# Patient Record
Sex: Female | Born: 1945
Health system: Southern US, Community
[De-identification: ages and names within clinical notes are randomized; demographics above are authoritative.]

## PROBLEM LIST (undated history)

## (undated) DIAGNOSIS — Z1159 Encounter for screening for other viral diseases: Secondary | ICD-10-CM

## (undated) DIAGNOSIS — R7303 Prediabetes: Secondary | ICD-10-CM

## (undated) DIAGNOSIS — E559 Vitamin D deficiency, unspecified: Secondary | ICD-10-CM

## (undated) DIAGNOSIS — K219 Gastro-esophageal reflux disease without esophagitis: Secondary | ICD-10-CM

## (undated) DIAGNOSIS — I10 Essential (primary) hypertension: Secondary | ICD-10-CM

## (undated) DIAGNOSIS — E785 Hyperlipidemia, unspecified: Secondary | ICD-10-CM

## (undated) DIAGNOSIS — C50919 Malignant neoplasm of unspecified site of unspecified female breast: Secondary | ICD-10-CM

## (undated) DIAGNOSIS — R0789 Other chest pain: Secondary | ICD-10-CM

## (undated) DIAGNOSIS — R002 Palpitations: Secondary | ICD-10-CM

## (undated) DIAGNOSIS — Z9189 Other specified personal risk factors, not elsewhere classified: Secondary | ICD-10-CM

## (undated) HISTORY — DX: Encounter for screening for other viral diseases: Z11.59

## (undated) HISTORY — DX: Gastro-esophageal reflux disease without esophagitis: K21.9

## (undated) HISTORY — DX: Prediabetes: R73.03

## (undated) HISTORY — DX: Vitamin D deficiency, unspecified: E55.9

## (undated) HISTORY — DX: Malignant neoplasm of unspecified site of unspecified female breast: C50.919

## (undated) HISTORY — PX: NASAL SINUS SURGERY: SHX719

## (undated) HISTORY — DX: Hyperlipidemia, unspecified: E78.5

## (undated) HISTORY — DX: Palpitations: R00.2

## (undated) HISTORY — PX: PARATHYROIDECTOMY: SHX19

## (undated) HISTORY — DX: Other specified personal risk factors, not elsewhere classified: Z91.89

## (undated) HISTORY — DX: Other chest pain: R07.89

## (undated) HISTORY — DX: Essential (primary) hypertension: I10

---

## 1997-10-12 ENCOUNTER — Other Ambulatory Visit: Admission: RE | Admit: 1997-10-12 | Discharge: 1997-10-12 | Payer: Self-pay | Admitting: Obstetrics and Gynecology

## 1999-06-19 ENCOUNTER — Ambulatory Visit (HOSPITAL_COMMUNITY): Admission: RE | Admit: 1999-06-19 | Discharge: 1999-06-19 | Payer: Self-pay

## 1999-07-19 ENCOUNTER — Ambulatory Visit (HOSPITAL_COMMUNITY): Admission: RE | Admit: 1999-07-19 | Discharge: 1999-07-20 | Payer: Self-pay

## 1999-09-25 ENCOUNTER — Other Ambulatory Visit: Admission: RE | Admit: 1999-09-25 | Discharge: 1999-09-25 | Payer: Self-pay | Admitting: Obstetrics and Gynecology

## 1999-09-27 ENCOUNTER — Encounter: Admission: RE | Admit: 1999-09-27 | Discharge: 1999-09-27 | Payer: Self-pay | Admitting: *Deleted

## 2000-06-30 ENCOUNTER — Ambulatory Visit (HOSPITAL_COMMUNITY): Admission: RE | Admit: 2000-06-30 | Discharge: 2000-06-30 | Payer: Self-pay | Admitting: Internal Medicine

## 2000-06-30 ENCOUNTER — Encounter: Payer: Self-pay | Admitting: Internal Medicine

## 2000-09-02 ENCOUNTER — Encounter (HOSPITAL_BASED_OUTPATIENT_CLINIC_OR_DEPARTMENT_OTHER): Payer: Self-pay | Admitting: General Surgery

## 2000-09-02 ENCOUNTER — Encounter: Admission: RE | Admit: 2000-09-02 | Discharge: 2000-09-02 | Payer: Self-pay | Admitting: General Surgery

## 2001-03-13 ENCOUNTER — Ambulatory Visit (HOSPITAL_COMMUNITY): Admission: RE | Admit: 2001-03-13 | Discharge: 2001-03-13 | Payer: Self-pay | Admitting: Internal Medicine

## 2001-03-13 ENCOUNTER — Encounter: Payer: Self-pay | Admitting: Internal Medicine

## 2002-05-07 ENCOUNTER — Encounter: Payer: Self-pay | Admitting: Internal Medicine

## 2002-05-07 ENCOUNTER — Encounter: Admission: RE | Admit: 2002-05-07 | Discharge: 2002-05-07 | Payer: Self-pay | Admitting: Internal Medicine

## 2002-05-19 ENCOUNTER — Other Ambulatory Visit: Admission: RE | Admit: 2002-05-19 | Discharge: 2002-05-19 | Payer: Self-pay | Admitting: Obstetrics and Gynecology

## 2003-06-21 ENCOUNTER — Encounter: Admission: RE | Admit: 2003-06-21 | Discharge: 2003-06-21 | Payer: Self-pay | Admitting: Internal Medicine

## 2003-06-28 ENCOUNTER — Other Ambulatory Visit: Admission: RE | Admit: 2003-06-28 | Discharge: 2003-06-28 | Payer: Self-pay | Admitting: Obstetrics and Gynecology

## 2004-03-27 ENCOUNTER — Ambulatory Visit (HOSPITAL_COMMUNITY): Admission: RE | Admit: 2004-03-27 | Discharge: 2004-03-27 | Payer: Self-pay | Admitting: Gastroenterology

## 2004-04-10 ENCOUNTER — Ambulatory Visit (HOSPITAL_BASED_OUTPATIENT_CLINIC_OR_DEPARTMENT_OTHER): Admission: RE | Admit: 2004-04-10 | Discharge: 2004-04-10 | Payer: Self-pay | Admitting: Otolaryngology

## 2004-06-29 ENCOUNTER — Encounter: Admission: RE | Admit: 2004-06-29 | Discharge: 2004-06-29 | Payer: Self-pay | Admitting: Internal Medicine

## 2004-07-06 ENCOUNTER — Other Ambulatory Visit: Admission: RE | Admit: 2004-07-06 | Discharge: 2004-07-06 | Payer: Self-pay | Admitting: Obstetrics and Gynecology

## 2005-07-16 ENCOUNTER — Encounter: Admission: RE | Admit: 2005-07-16 | Discharge: 2005-07-16 | Payer: Self-pay | Admitting: Internal Medicine

## 2005-07-17 ENCOUNTER — Other Ambulatory Visit: Admission: RE | Admit: 2005-07-17 | Discharge: 2005-07-17 | Payer: Self-pay | Admitting: Obstetrics and Gynecology

## 2005-07-18 ENCOUNTER — Encounter: Admission: RE | Admit: 2005-07-18 | Discharge: 2005-07-18 | Payer: Self-pay | Admitting: Internal Medicine

## 2006-01-13 ENCOUNTER — Encounter: Admission: RE | Admit: 2006-01-13 | Discharge: 2006-01-13 | Payer: Self-pay | Admitting: Internal Medicine

## 2006-07-18 ENCOUNTER — Encounter: Admission: RE | Admit: 2006-07-18 | Discharge: 2006-07-18 | Payer: Self-pay | Admitting: Internal Medicine

## 2007-07-27 ENCOUNTER — Encounter: Admission: RE | Admit: 2007-07-27 | Discharge: 2007-07-27 | Payer: Self-pay | Admitting: Internal Medicine

## 2008-10-03 ENCOUNTER — Encounter: Admission: RE | Admit: 2008-10-03 | Discharge: 2008-10-03 | Payer: Self-pay | Admitting: Internal Medicine

## 2009-08-28 ENCOUNTER — Encounter: Admission: RE | Admit: 2009-08-28 | Discharge: 2009-08-28 | Payer: Self-pay | Admitting: Internal Medicine

## 2009-08-30 ENCOUNTER — Ambulatory Visit: Payer: Self-pay | Admitting: Oncology

## 2009-08-31 ENCOUNTER — Encounter: Admission: RE | Admit: 2009-08-31 | Discharge: 2009-08-31 | Payer: Self-pay | Admitting: Internal Medicine

## 2009-09-06 LAB — COMPREHENSIVE METABOLIC PANEL
ALT: 14 U/L (ref 0–35)
AST: 24 U/L (ref 0–37)
Albumin: 4.4 g/dL (ref 3.5–5.2)
Alkaline Phosphatase: 96 U/L (ref 39–117)
Calcium: 10 mg/dL (ref 8.4–10.5)
Chloride: 106 mEq/L (ref 96–112)
Potassium: 3.6 mEq/L (ref 3.5–5.3)
Sodium: 141 mEq/L (ref 135–145)
Total Protein: 7.6 g/dL (ref 6.0–8.3)

## 2009-09-06 LAB — CBC WITH DIFFERENTIAL/PLATELET
BASO%: 0.6 % (ref 0.0–2.0)
Basophils Absolute: 0 10*3/uL (ref 0.0–0.1)
EOS%: 0.3 % (ref 0.0–7.0)
HGB: 15 g/dL (ref 11.6–15.9)
MCH: 31 pg (ref 25.1–34.0)
MCHC: 34 g/dL (ref 31.5–36.0)
MCV: 91.1 fL (ref 79.5–101.0)
MONO%: 9.2 % (ref 0.0–14.0)
RBC: 4.85 10*6/uL (ref 3.70–5.45)
RDW: 13.2 % (ref 11.2–14.5)
lymph#: 1.4 10*3/uL (ref 0.9–3.3)

## 2009-09-14 ENCOUNTER — Ambulatory Visit (HOSPITAL_COMMUNITY): Admission: RE | Admit: 2009-09-14 | Discharge: 2009-09-14 | Payer: Self-pay | Admitting: Oncology

## 2009-09-15 ENCOUNTER — Ambulatory Visit (HOSPITAL_BASED_OUTPATIENT_CLINIC_OR_DEPARTMENT_OTHER): Admission: RE | Admit: 2009-09-15 | Discharge: 2009-09-15 | Payer: Self-pay | Admitting: General Surgery

## 2009-09-28 LAB — CBC WITH DIFFERENTIAL/PLATELET
BASO%: 0 % (ref 0.0–2.0)
Basophils Absolute: 0 10*3/uL (ref 0.0–0.1)
EOS%: 0 % (ref 0.0–7.0)
HCT: 42.7 % (ref 34.8–46.6)
LYMPH%: 44.4 % (ref 14.0–49.7)
MCH: 30.2 pg (ref 25.1–34.0)
MCHC: 34 g/dL (ref 31.5–36.0)
NEUT%: 33.4 % — ABNORMAL LOW (ref 38.4–76.8)
Platelets: 120 10*3/uL — ABNORMAL LOW (ref 145–400)

## 2009-10-04 ENCOUNTER — Ambulatory Visit: Payer: Self-pay | Admitting: Oncology

## 2009-10-05 LAB — CBC WITH DIFFERENTIAL/PLATELET
BASO%: 0.6 % (ref 0.0–2.0)
EOS%: 0.1 % (ref 0.0–7.0)
HCT: 38.7 % (ref 34.8–46.6)
MCH: 31.4 pg (ref 25.1–34.0)
MCHC: 34.2 g/dL (ref 31.5–36.0)
MCV: 91.8 fL (ref 79.5–101.0)
MONO%: 5.9 % (ref 0.0–14.0)
NEUT%: 82.1 % — ABNORMAL HIGH (ref 38.4–76.8)
lymph#: 0.8 10*3/uL — ABNORMAL LOW (ref 0.9–3.3)

## 2009-10-12 LAB — COMPREHENSIVE METABOLIC PANEL
ALT: 11 U/L (ref 0–35)
AST: 15 U/L (ref 0–37)
CO2: 23 mEq/L (ref 19–32)
Creatinine, Ser: 0.95 mg/dL (ref 0.40–1.20)
Total Bilirubin: 0.4 mg/dL (ref 0.3–1.2)

## 2009-10-12 LAB — CBC WITH DIFFERENTIAL/PLATELET
BASO%: 0.2 % (ref 0.0–2.0)
EOS%: 0 % (ref 0.0–7.0)
LYMPH%: 6.7 % — ABNORMAL LOW (ref 14.0–49.7)
MCHC: 33.3 g/dL (ref 31.5–36.0)
MCV: 90.6 fL (ref 79.5–101.0)
MONO%: 1.5 % (ref 0.0–14.0)
Platelets: 287 10*3/uL (ref 145–400)
RBC: 4.45 10*6/uL (ref 3.70–5.45)
nRBC: 0 % (ref 0–0)

## 2009-10-19 LAB — CBC WITH DIFFERENTIAL/PLATELET
BASO%: 1.1 % (ref 0.0–2.0)
HCT: 38 % (ref 34.8–46.6)
LYMPH%: 40 % (ref 14.0–49.7)
MCHC: 34 g/dL (ref 31.5–36.0)
MONO#: 0.2 10*3/uL (ref 0.1–0.9)
NEUT%: 37.3 % — ABNORMAL LOW (ref 38.4–76.8)
Platelets: 134 10*3/uL — ABNORMAL LOW (ref 145–400)
WBC: 1 10*3/uL — ABNORMAL LOW (ref 3.9–10.3)

## 2009-11-02 LAB — CBC WITH DIFFERENTIAL/PLATELET
BASO%: 0.1 % (ref 0.0–2.0)
EOS%: 0 % (ref 0.0–7.0)
HCT: 38.2 % (ref 34.8–46.6)
LYMPH%: 6.5 % — ABNORMAL LOW (ref 14.0–49.7)
MCH: 30 pg (ref 25.1–34.0)
MCHC: 32.7 g/dL (ref 31.5–36.0)
MONO%: 2.4 % (ref 0.0–14.0)
NEUT%: 91 % — ABNORMAL HIGH (ref 38.4–76.8)
Platelets: 246 10*3/uL (ref 145–400)
lymph#: 0.6 10*3/uL — ABNORMAL LOW (ref 0.9–3.3)

## 2009-11-02 LAB — COMPREHENSIVE METABOLIC PANEL
ALT: 9 U/L (ref 0–35)
AST: 17 U/L (ref 0–37)
Alkaline Phosphatase: 58 U/L (ref 39–117)
Creatinine, Ser: 0.91 mg/dL (ref 0.40–1.20)
Total Bilirubin: 0.5 mg/dL (ref 0.3–1.2)

## 2009-11-03 ENCOUNTER — Ambulatory Visit: Payer: Self-pay | Admitting: Oncology

## 2009-11-09 LAB — CBC WITH DIFFERENTIAL/PLATELET
BASO%: 0 % (ref 0.0–2.0)
Eosinophils Absolute: 0 10*3/uL (ref 0.0–0.5)
MCHC: 33.5 g/dL (ref 31.5–36.0)
MONO#: 0 10*3/uL — ABNORMAL LOW (ref 0.1–0.9)
NEUT#: 0 10*3/uL — CL (ref 1.5–6.5)
RBC: 4.44 10*6/uL (ref 3.70–5.45)
WBC: 0.5 10*3/uL — CL (ref 3.9–10.3)
lymph#: 0.5 10*3/uL — ABNORMAL LOW (ref 0.9–3.3)
nRBC: 0 % (ref 0–0)

## 2009-11-20 ENCOUNTER — Encounter: Admission: RE | Admit: 2009-11-20 | Discharge: 2009-11-20 | Payer: Self-pay | Admitting: Oncology

## 2009-11-23 LAB — CBC WITH DIFFERENTIAL/PLATELET
Basophils Absolute: 0 10*3/uL (ref 0.0–0.1)
EOS%: 0 % (ref 0.0–7.0)
Eosinophils Absolute: 0 10*3/uL (ref 0.0–0.5)
HGB: 11.5 g/dL — ABNORMAL LOW (ref 11.6–15.9)
MONO#: 0.6 10*3/uL (ref 0.1–0.9)
NEUT#: 12 10*3/uL — ABNORMAL HIGH (ref 1.5–6.5)
RDW: 15.3 % — ABNORMAL HIGH (ref 11.2–14.5)
WBC: 13.2 10*3/uL — ABNORMAL HIGH (ref 3.9–10.3)
lymph#: 0.6 10*3/uL — ABNORMAL LOW (ref 0.9–3.3)

## 2009-11-23 LAB — COMPREHENSIVE METABOLIC PANEL
ALT: 13 U/L (ref 0–35)
AST: 18 U/L (ref 0–37)
Albumin: 4.1 g/dL (ref 3.5–5.2)
Alkaline Phosphatase: 43 U/L (ref 39–117)
BUN: 16 mg/dL (ref 6–23)
Potassium: 3.8 mEq/L (ref 3.5–5.3)

## 2009-11-30 LAB — CBC WITH DIFFERENTIAL/PLATELET
Basophils Absolute: 0 10*3/uL (ref 0.0–0.1)
EOS%: 1.7 % (ref 0.0–7.0)
HCT: 35.1 % (ref 34.8–46.6)
HGB: 11.5 g/dL — ABNORMAL LOW (ref 11.6–15.9)
MCH: 30 pg (ref 25.1–34.0)
MCHC: 32.8 g/dL (ref 31.5–36.0)
MCV: 91.6 fL (ref 79.5–101.0)
MONO%: 22.4 % — ABNORMAL HIGH (ref 0.0–14.0)
NEUT%: 19.1 % — ABNORMAL LOW (ref 38.4–76.8)
RDW: 14.9 % — ABNORMAL HIGH (ref 11.2–14.5)

## 2009-12-12 ENCOUNTER — Ambulatory Visit: Payer: Self-pay | Admitting: Oncology

## 2009-12-14 LAB — COMPREHENSIVE METABOLIC PANEL
ALT: 11 U/L (ref 0–35)
AST: 18 U/L (ref 0–37)
Albumin: 4 g/dL (ref 3.5–5.2)
CO2: 19 mEq/L (ref 19–32)
Calcium: 9.3 mg/dL (ref 8.4–10.5)
Chloride: 105 mEq/L (ref 96–112)
Creatinine, Ser: 0.76 mg/dL (ref 0.40–1.20)
Potassium: 4 mEq/L (ref 3.5–5.3)
Sodium: 140 mEq/L (ref 135–145)
Total Protein: 5.5 g/dL — ABNORMAL LOW (ref 6.0–8.3)

## 2009-12-14 LAB — CBC WITH DIFFERENTIAL/PLATELET
BASO%: 0.1 % (ref 0.0–2.0)
EOS%: 0 % (ref 0.0–7.0)
HCT: 33.6 % — ABNORMAL LOW (ref 34.8–46.6)
MCH: 30.8 pg (ref 25.1–34.0)
MCHC: 32.1 g/dL (ref 31.5–36.0)
MONO#: 0.4 10*3/uL (ref 0.1–0.9)
NEUT%: 93.4 % — ABNORMAL HIGH (ref 38.4–76.8)
RDW: 17.2 % — ABNORMAL HIGH (ref 11.2–14.5)
WBC: 14.4 10*3/uL — ABNORMAL HIGH (ref 3.9–10.3)
lymph#: 0.6 10*3/uL — ABNORMAL LOW (ref 0.9–3.3)

## 2009-12-21 LAB — CBC WITH DIFFERENTIAL/PLATELET
BASO%: 2.1 % — ABNORMAL HIGH (ref 0.0–2.0)
EOS%: 0.4 % (ref 0.0–7.0)
HCT: 28.9 % — ABNORMAL LOW (ref 34.8–46.6)
MCH: 30.8 pg (ref 25.1–34.0)
MCHC: 32.5 g/dL (ref 31.5–36.0)
MONO#: 0.5 10*3/uL (ref 0.1–0.9)
NEUT%: 58.5 % (ref 38.4–76.8)
RBC: 3.05 10*6/uL — ABNORMAL LOW (ref 3.70–5.45)
RDW: 15.8 % — ABNORMAL HIGH (ref 11.2–14.5)
WBC: 2.4 10*3/uL — ABNORMAL LOW (ref 3.9–10.3)
lymph#: 0.5 10*3/uL — ABNORMAL LOW (ref 0.9–3.3)
nRBC: 0 % (ref 0–0)

## 2009-12-28 LAB — CBC WITH DIFFERENTIAL/PLATELET
EOS%: 0.1 % (ref 0.0–7.0)
Eosinophils Absolute: 0 10*3/uL (ref 0.0–0.5)
HGB: 10.2 g/dL — ABNORMAL LOW (ref 11.6–15.9)
MCV: 97.2 fL (ref 79.5–101.0)
MONO%: 5.3 % (ref 0.0–14.0)
NEUT#: 17.9 10*3/uL — ABNORMAL HIGH (ref 1.5–6.5)
RBC: 3.27 10*6/uL — ABNORMAL LOW (ref 3.70–5.45)
RDW: 16.6 % — ABNORMAL HIGH (ref 11.2–14.5)
lymph#: 0.9 10*3/uL (ref 0.9–3.3)
nRBC: 1 % — ABNORMAL HIGH (ref 0–0)

## 2010-01-08 HISTORY — PX: MASTECTOMY: SHX3

## 2010-01-09 ENCOUNTER — Ambulatory Visit: Admission: RE | Admit: 2010-01-09 | Discharge: 2010-04-09 | Payer: Self-pay | Admitting: Radiation Oncology

## 2010-01-12 ENCOUNTER — Ambulatory Visit: Payer: Self-pay | Admitting: Oncology

## 2010-01-16 LAB — CBC WITH DIFFERENTIAL/PLATELET
BASO%: 0.3 % (ref 0.0–2.0)
Eosinophils Absolute: 0 10*3/uL (ref 0.0–0.5)
MCHC: 31.8 g/dL (ref 31.5–36.0)
MCV: 103.7 fL — ABNORMAL HIGH (ref 79.5–101.0)
MONO#: 0.8 10*3/uL (ref 0.1–0.9)
MONO%: 21.2 % — ABNORMAL HIGH (ref 0.0–14.0)
NEUT#: 1.9 10*3/uL (ref 1.5–6.5)
RBC: 3.24 10*6/uL — ABNORMAL LOW (ref 3.70–5.45)
RDW: 17.4 % — ABNORMAL HIGH (ref 11.2–14.5)
WBC: 4 10*3/uL (ref 3.9–10.3)
nRBC: 0 % (ref 0–0)

## 2010-01-31 ENCOUNTER — Ambulatory Visit (HOSPITAL_COMMUNITY): Admission: RE | Admit: 2010-01-31 | Discharge: 2010-02-01 | Payer: Self-pay | Admitting: General Surgery

## 2010-01-31 ENCOUNTER — Encounter (INDEPENDENT_AMBULATORY_CARE_PROVIDER_SITE_OTHER): Payer: Self-pay | Admitting: General Surgery

## 2010-03-05 ENCOUNTER — Ambulatory Visit: Payer: Self-pay | Admitting: Oncology

## 2010-03-05 LAB — COMPREHENSIVE METABOLIC PANEL
Albumin: 4.3 g/dL (ref 3.5–5.2)
BUN: 11 mg/dL (ref 6–23)
CO2: 30 mEq/L (ref 19–32)
Calcium: 10 mg/dL (ref 8.4–10.5)
Chloride: 103 mEq/L (ref 96–112)
Glucose, Bld: 103 mg/dL — ABNORMAL HIGH (ref 70–99)
Potassium: 3.7 mEq/L (ref 3.5–5.3)
Sodium: 142 mEq/L (ref 135–145)
Total Protein: 6.8 g/dL (ref 6.0–8.3)

## 2010-03-05 LAB — CBC WITH DIFFERENTIAL/PLATELET
Basophils Absolute: 0 10*3/uL (ref 0.0–0.1)
Eosinophils Absolute: 0 10*3/uL (ref 0.0–0.5)
HCT: 37.8 % (ref 34.8–46.6)
MCV: 95.2 fL (ref 79.5–101.0)
MONO#: 0.3 10*3/uL (ref 0.1–0.9)
RDW: 13.6 % (ref 11.2–14.5)
lymph#: 0.7 10*3/uL — ABNORMAL LOW (ref 0.9–3.3)

## 2010-03-21 ENCOUNTER — Ambulatory Visit: Payer: Self-pay | Admitting: Genetic Counselor

## 2010-04-09 ENCOUNTER — Ambulatory Visit: Admission: RE | Admit: 2010-04-09 | Discharge: 2010-04-16 | Payer: Self-pay | Admitting: Radiation Oncology

## 2010-06-19 ENCOUNTER — Ambulatory Visit: Payer: Self-pay | Admitting: Oncology

## 2010-06-21 LAB — CBC WITH DIFFERENTIAL/PLATELET
BASO%: 0.4 % (ref 0.0–2.0)
Basophils Absolute: 0 10*3/uL (ref 0.0–0.1)
EOS%: 0.6 % (ref 0.0–7.0)
Eosinophils Absolute: 0 10*3/uL (ref 0.0–0.5)
HCT: 40.2 % (ref 34.8–46.6)
HGB: 13.6 g/dL (ref 11.6–15.9)
LYMPH%: 28.2 % (ref 14.0–49.7)
MCH: 31.4 pg (ref 25.1–34.0)
MCHC: 33.8 g/dL (ref 31.5–36.0)
MCV: 92.8 fL (ref 79.5–101.0)
MONO#: 0.3 10*3/uL (ref 0.1–0.9)
MONO%: 9 % (ref 0.0–14.0)
NEUT#: 1.8 10*3/uL (ref 1.5–6.5)
NEUT%: 61.8 % (ref 38.4–76.8)
Platelets: 183 10*3/uL (ref 145–400)
RBC: 4.33 10*6/uL (ref 3.70–5.45)
RDW: 13.8 % (ref 11.2–14.5)
WBC: 2.9 10*3/uL — ABNORMAL LOW (ref 3.9–10.3)
lymph#: 0.8 10*3/uL — ABNORMAL LOW (ref 0.9–3.3)

## 2010-07-01 ENCOUNTER — Encounter: Payer: Self-pay | Admitting: Oncology

## 2010-08-22 ENCOUNTER — Other Ambulatory Visit: Payer: Self-pay | Admitting: Obstetrics and Gynecology

## 2010-08-24 LAB — URINALYSIS, ROUTINE W REFLEX MICROSCOPIC
Bilirubin Urine: NEGATIVE
Ketones, ur: NEGATIVE mg/dL
Nitrite: NEGATIVE
Protein, ur: NEGATIVE mg/dL
Urobilinogen, UA: 0.2 mg/dL (ref 0.0–1.0)
pH: 5.5 (ref 5.0–8.0)

## 2010-08-24 LAB — COMPREHENSIVE METABOLIC PANEL
AST: 31 U/L (ref 0–37)
CO2: 30 mEq/L (ref 19–32)
Chloride: 100 mEq/L (ref 96–112)
Creatinine, Ser: 0.8 mg/dL (ref 0.4–1.2)
GFR calc Af Amer: >60 mL/min (ref >60)
GFR calc non Af Amer: >60 mL/min (ref >60)
Glucose, Bld: 106 mg/dL — ABNORMAL HIGH (ref 70–99)
Total Bilirubin: 0.6 mg/dL (ref 0.3–1.2)

## 2010-08-24 LAB — CBC
HCT: 37.7 % (ref 36.0–46.0)
Hemoglobin: 12.2 g/dL (ref 12.0–15.0)
MCH: 33.2 pg (ref 26.0–34.0)
MCV: 102.7 fL — ABNORMAL HIGH (ref 78.0–100.0)
RBC: 3.67 MIL/uL — ABNORMAL LOW (ref 3.87–5.11)

## 2010-08-24 LAB — DIFFERENTIAL
Basophils Absolute: 0 10*3/uL (ref 0.0–0.1)
Eosinophils Absolute: 0 10*3/uL (ref 0.0–0.7)
Eosinophils Relative: 1 % (ref 0–5)
Lymphocytes Relative: 32 % (ref 12–46)

## 2010-08-24 LAB — URINE MICROSCOPIC-ADD ON

## 2010-08-24 LAB — SURGICAL PCR SCREEN: Staphylococcus aureus: NEGATIVE

## 2010-08-29 LAB — COMPREHENSIVE METABOLIC PANEL
ALT: 13 U/L (ref 0–35)
AST: 26 U/L (ref 0–37)
CO2: 30 mEq/L (ref 19–32)
Chloride: 106 mEq/L (ref 96–112)
Creatinine, Ser: 1.12 mg/dL (ref 0.4–1.2)
GFR calc Af Amer: 60 mL/min — ABNORMAL LOW (ref >60)
GFR calc non Af Amer: 49 mL/min — ABNORMAL LOW (ref >60)
Glucose, Bld: 117 mg/dL — ABNORMAL HIGH (ref 70–99)
Total Bilirubin: 0.9 mg/dL (ref 0.3–1.2)

## 2010-08-29 LAB — DIFFERENTIAL
Basophils Absolute: 0 10*3/uL (ref 0.0–0.1)
Eosinophils Absolute: 0 10*3/uL (ref 0.0–0.7)
Eosinophils Relative: 1 % (ref 0–5)
Lymphocytes Relative: 30 % (ref 12–46)
Neutrophils Relative %: 62 % (ref 43–77)

## 2010-08-29 LAB — URINALYSIS, ROUTINE W REFLEX MICROSCOPIC
Ketones, ur: NEGATIVE mg/dL
Nitrite: NEGATIVE
Protein, ur: NEGATIVE mg/dL
Urobilinogen, UA: 0.2 mg/dL (ref 0.0–1.0)

## 2010-08-29 LAB — CBC
Hemoglobin: 14.3 g/dL (ref 12.0–15.0)
MCHC: 33.7 g/dL (ref 30.0–36.0)
MCV: 91.3 fL (ref 78.0–100.0)
RBC: 4.65 MIL/uL (ref 3.87–5.11)
WBC: 4.9 10*3/uL (ref 4.0–10.5)

## 2010-10-17 ENCOUNTER — Encounter (INDEPENDENT_AMBULATORY_CARE_PROVIDER_SITE_OTHER): Payer: Self-pay | Admitting: General Surgery

## 2010-10-18 ENCOUNTER — Ambulatory Visit (INDEPENDENT_AMBULATORY_CARE_PROVIDER_SITE_OTHER): Payer: 59 | Admitting: Internal Medicine

## 2010-10-18 ENCOUNTER — Encounter: Payer: Self-pay | Admitting: Internal Medicine

## 2010-10-18 DIAGNOSIS — R079 Chest pain, unspecified: Secondary | ICD-10-CM | POA: Insufficient documentation

## 2010-10-18 DIAGNOSIS — E785 Hyperlipidemia, unspecified: Secondary | ICD-10-CM

## 2010-10-18 DIAGNOSIS — I10 Essential (primary) hypertension: Secondary | ICD-10-CM | POA: Insufficient documentation

## 2010-10-18 DIAGNOSIS — E782 Mixed hyperlipidemia: Secondary | ICD-10-CM | POA: Insufficient documentation

## 2010-10-18 NOTE — Assessment & Plan Note (Signed)
Has been on crestor for a long time.  Will need to get labs.

## 2010-10-18 NOTE — Assessment & Plan Note (Signed)
BP is borderline high on meds.  I would follow for now.

## 2010-10-18 NOTE — Assessment & Plan Note (Signed)
CHest pressure is not completely typical for angina.  BUt concerning . She does have risk factors for CAD  I would recomm continuing meds.  I also would recomm a stress myoview to rule out inducible ischemia.

## 2010-10-18 NOTE — Patient Instructions (Signed)
Your physician has requested that you have en exercise stress myoview. For further information please visit [email or web address] Please follow instruction sheet, as given. We will call you with results.

## 2010-10-18 NOTE — Progress Notes (Signed)
HPI Patient is a 65 year old with no history of CAD.  She was referred for evaluation of chest tightness. THe patient notes episodes of L sided chest tightness over the past few months.  She says that they mainly occur on the weekends when she is sitting at her mothers.  Occasionall she gets them at home during the week.  Last minutes.  Pain is not pleuriitc or positional. In March of last year she was diagnosied with breast cancer  She underwent chemo, XRT an bilateral mastectomy  She wonders if some of her symptoms are related to surgery. Allergies  Allergen Reactions  . Penicillins   . Sulfa Antibiotics     Current Outpatient Prescriptions  Medication Sig Dispense Refill  . aspirin 81 MG tablet Take 81 mg by mouth daily.        . bisoprolol-hydrochlorothiazide (ZIAC) 5-6.25 MG per tablet Take 1 tablet by mouth daily.        . Cholecalciferol (VITAMIN D-3) 5000 UNITS TABS Take 1 tablet by mouth daily.        . fluticasone (FLONASE) 50 MCG/ACT nasal spray 2 sprays by Nasal route daily.        Marland Kitchen letrozole (FEMARA) 2.5 MG tablet Take 2.5 mg by mouth daily.        . quinapril (ACCUPRIL) 20 MG tablet Take 10 mg by mouth at bedtime.        . rosuvastatin (CRESTOR) 20 MG tablet Take 10 mg by mouth daily.        Marland Kitchen DISCONTD: hydrochlorothiazide (HYDRODIURIL) 12.5 MG tablet Take 12.5 mg by mouth daily.        Marland Kitchen DISCONTD: mupirocin (BACTROBAN) 2 % ointment Apply topically as needed.        Marland Kitchen DISCONTD: rosuvastatin (CRESTOR) 10 MG tablet Take 10 mg by mouth daily.         Past Medical History  Diagnosis Date  . Hypertension   . Chest tightness   . Palpitations     Past Surgical History  Procedure Date  . Mastectomy August 2011    Bilateral    Family History  Problem Relation Age of Onset  . Diabetes Mother   . Cancer Mother     breast    History   Social History  . Marital Status: Married    Spouse Name: N/A    Number of Children: N/A  . Years of Education: N/A    Occupational History  . Not on file.   Social History Main Topics  . Smoking status: Never Smoker   . Smokeless tobacco: Not on file  . Alcohol Use: No  . Drug Use: No  . Sexually Active: Not on file   Other Topics Concern  . Not on file   Social History Narrative  . No narrative on file    Review of Systems:  All systems reviewed.  They are negative to the above problem except as previously stated.  Vital Signs: BP 140/90  Pulse 69  Resp 18  Ht 5\' 7"  (1.702 m)  Wt 141 lb 1.9 oz (64.012 kg)  BMI 22.10 kg/m2  Physical Exam  HEENT:  Normocephalic, atraumatic. EOMI, PERRLA.  Neck: JVP is normal. No thyromegaly. No bruits.  Lungs: clear to auscultation. No rales no wheezes.  Chest:  S/p bilateral mastectomy Heart: Regular rate and rhythm. Normal S1, S2. No S3.   No significant murmurs. PMI not displaced.  Abdomen:  Supple, nontender. Normal bowel sounds. No masses. No hepatomegaly.  Extremities:   Good distal pulses throughout. No lower extremity edema.  Musculoskeletal :moving all extremities.  Neuro:   alert and oriented x3.  CN II-XII grossly intact.   Assessment and Plan:

## 2010-10-26 ENCOUNTER — Encounter (INDEPENDENT_AMBULATORY_CARE_PROVIDER_SITE_OTHER): Payer: Self-pay | Admitting: General Surgery

## 2010-10-26 NOTE — Op Note (Signed)
Shannon Nielsen, Shannon Nielsen               ACCOUNT NO.:  [id number]   MEDICAL RECORD NO.:  [id number]          PATIENT TYPE:  AMB   LOCATION:  ENDO                         FACILITY[facility]  [facility]   PHYSICIAN:  Petra Kuba, M.D.    DATE OF BIRTH:  [birth year]   DATE OF PROCEDURE:  03/27/2004  DATE OF DISCHARGE:                                 OPERATIVE REPORT   PROCEDURE:  Colonoscopy.   INDICATION:  Bright red blood per rectum, probably due to hemorrhoids, due  for colonic screening.  Consent was signed after risks, benefits, methods,  options thoroughly discussed in the office in the past.   MEDICINES USED:  1.  Demerol 60.  2.  Versed 6.   DESCRIPTION OF PROCEDURE:  Rectal inspection was pertinent for obvious  external hemorrhoids.  Digital exam was negative.  Video pediatric  adjustable colonoscope was inserted and with abdominal pressure, able to be  advanced around the colon to the cecum.  This also required rolling her on  her back.  No abnormality was seen on insertion.  The cecum was identified  by the appendiceal orifice and the ileocecal valve.  In fact, the scope was  inserted just into the opening of the TI which was normal on quick  evaluation.  The scope was slowly withdrawn.  The prep on the left was  greater than the right.  The right did have some stool adherent to the wall  which could not be washed off.  The rest of the prep was adequate but on  slow withdrawal through the colon, no abnormalities were seen.  Specifically, no polyps, tumors, masses, or diverticula.  Once back in the  rectum, anorectal pull-through and retroflexion confirmed the external  greater than internal hemorrhoids.  The scope was straightened, readvanced a  short ways up the left side of the colon; air was suctioned and the scope  removed.  The patient tolerated the procedure well.  There was no obvious  immediate complication.   ENDOSCOPIC DIAGNOSES:  1.  External greater than internal  hemorrhoids.  2.  Otherwise, within normal limits to the cecum.   PLAN:  1.  Recheck colon screening in 5-10 years.  2.  Happy to see back p.r.n..  3.  Preparation H plus 1% hydrocortisone to her hemorrhoids.  Consider      prescription creams and suppositories or to surgery p.r.n.      MEM/MEDQ  D:  03/27/2004  T:  03/27/2004  Job:  [id number]   cc:   Lucky Cowboy, M.D.  [address]  [place], [place] [address]  Fax: [phone]   Miguel Aschoff, M.D.  [address]  [place]  [place] [address]  Fax: [phone]

## 2010-10-26 NOTE — Procedures (Signed)
NAME:  Shannon Nielsen, Shannon Nielsen               ACCOUNT NO.:  [id number]   MEDICAL RECORD NO.:  [id number]          PATIENT TYPE:  OUT   LOCATION:  SLEEP CENTER                 FACILITY:  [facility]   PHYSICIAN:  Clinton D. Maple Hudson, M.D. DATE OF BIRTH:  1945-10-12   DATE OF STUDY:  04/10/2004                              NOCTURNAL POLYSOMNOGRAM   REFERRING PHYSICIAN:  Suzanna Obey, M.D.   INDICATION FOR STUDY AND HISTORY:  Hypersomnia with sleep apnea.   Epworth sleepiness score 20/24, neck size 14.5 inches.   SLEEP ARCHITECTURE:  Total sleep time 334 minutes with sleep efficiency of  92%.  Stage I was 5%; stage II, 41%; Stages III and IV 19%.  REM was 36% of  total sleep time.  Latency to sleep onset 11 minutes, latency to REM 45  minutes.  Awake after sleep onset 19.5 minute.  Arousal index 23.   RESPIRATORY DATA:  NPFG protocol.  RDI 23.9 per hour indicating moderate  obstructive sleep apnea/hypopnea syndrome.  This reflect 12 obstructive  apneas and 121 hypopneas.  Events were not positional.  REM RDI 46 per hour.   OXYGEN DATA:  Loud snoring with oxygen desaturation to a nadir of 87% with  apneas.  Mean oxygen saturation through the study was 97 to 98% on room air.   CARDIAC DATA:  Normal sinus rhythm.   MOVEMENT AND PARASOMNIA:  A total of 51 limb jerks were recorded of which 13  were associated with arousal or awakening for a periodic limb movement with  arousal index of 2.3 per hour.   IMPRESSION AND RECOMMENDATIONS:  1.  Moderate obstructive sleep apnea/hypopnea syndrome, RDI 13.9 per hour      with desaturation to 87%.  2.  Mild periodic limb movement with arousal, 2.3 per hour.   Clinton D. Maple Hudson, M.D.  Diplomate, Biomedical engineer of Sleep Medicine                                                           Clinton D. Maple Hudson, M.D.  Diplomate, American Board   CDY/MEDQ  D:  04/22/2004 09:37:49  T:  04/22/2004 11:32:52  Job:  [id number]   cc:   Suzanna Obey, M.D.  [address] [place]  [place] [address]  Fax: [phone]

## 2010-10-29 ENCOUNTER — Ambulatory Visit (HOSPITAL_COMMUNITY): Payer: 59 | Attending: Internal Medicine | Admitting: Radiology

## 2010-10-29 ENCOUNTER — Encounter (HOSPITAL_COMMUNITY): Payer: Self-pay | Admitting: Radiology

## 2010-10-29 DIAGNOSIS — R0789 Other chest pain: Secondary | ICD-10-CM

## 2010-10-29 DIAGNOSIS — R9431 Abnormal electrocardiogram [ECG] [EKG]: Secondary | ICD-10-CM

## 2010-10-29 DIAGNOSIS — R079 Chest pain, unspecified: Secondary | ICD-10-CM | POA: Insufficient documentation

## 2010-10-29 MED ORDER — TECHNETIUM TC 99M TETROFOSMIN IV KIT
33.0000 | PACK | Freq: Once | INTRAVENOUS | Status: AC | PRN
  Administered 2010-10-29: 33 via INTRAVENOUS

## 2010-10-29 MED ORDER — TECHNETIUM TC 99M TETROFOSMIN IV KIT
10.9000 | PACK | Freq: Once | INTRAVENOUS | Status: AC | PRN
  Administered 2010-10-29: 10.9 via INTRAVENOUS

## 2010-10-29 NOTE — Progress Notes (Addendum)
[facility] SITE 3 NUCLEAR MED [address] [place] [address] [phone]  Cardiology Nuclear Med Study  Shannon Nielsen is a 65 y.o. female [id number] May 17, 1946   Nuclear Med Background Indication for Stress Test:  Evaluation for Ischemia History:  Echo Cardiac Risk Factors: Hypertension and Lipids  Symptoms:  Chest Pressure with Exertion (last date of chest discomfort present) and Chest Tightness with Exertion (last date of chest discomfort present)   Nuclear Pre-Procedure Caffeine/Decaff Intake:  None NPO After: 9:30pm   Lungs:  clear IV 0.9% NS with Angio Cath:  22g  IV Site: R Antecubital  IV Started by:  Bonnita Levan, RN  Chest Size (in):  Bilateral Mastectomy Cup Size: n/a  Height: 5\' 7"  (1.702 m)  Weight:  141 lb (63.957 kg)  BMI:  Body mass index is 22.08 kg/(m^2). Tech Comments: Ziac held x 22 hrs    Nuclear Med Study 1 or 2 day study: 1 day  Stress Test Type:  Stress  Reading MD: Kristeen Miss, MD  Order Authorizing Provider:  Dr. Lovina Reach  Resting Radionuclide: Technetium 36m Tetrofosmin  Resting Radionuclide Dose: 10.9 mCi   Stress Radionuclide:  Technetium 77m Tetrofosmin  Stress Radionuclide Dose: 33 mCi           Stress Protocol Rest HR: 82 Stress HR: 144  Rest BP: 151/76 Stress BP: 174/89  Exercise Time (min): 6:30 METS: 7.0          Dose of Adenosine (mg):  n/a Dose of Lexiscan: n/a mg  Dose of Atropine (mg): n/a Dose of Dobutamine: n/a mcg/kg/min (at max HR)  Stress Test Technologist: Cathlyn Parsons, RN  Nuclear Technologist:  Harlow Asa, CNMT     Rest Procedure:  Myocardial perfusion imaging was performed at rest 45 minutes following the intravenous administration of Technetium 39m Tetrofosmin. Rest ECG: NSR with abnormal T wave  Stress Procedure:  The patient exercised for 6:30.  The patient stopped due to legs fatigued and neuropathy in legs and feet. Patient had chest pressure 6/10 with stress test.  There  were ST-T wave changes with stress test and resolved in recovery.  No ectopy noted. Technetium 62m Tetrofosmin was injected at peak exercise and myocardial perfusion imaging was performed after a brief delay. Stress ECG: No significant change from baseline ECG  QPS Raw Data Images:  Normal; no motion artifact; normal heart/lung ratio. Stress Images:  Normal homogeneous uptake in all areas of the myocardium. Rest Images:  Normal homogeneous uptake in all areas of the myocardium. Subtraction (SDS):  No evidence of ischemia. Transient Ischemic Dilatation (Normal <1.22): 1.05 Lung/Heart Ratio (Normal <0.45):  .28  Quantitative Gated Spect Images QGS EDV:  74 ml QGS ESV:  30 ml QGS cine images:  NL LV Function; NL Wall Motion QGS EF: 60%  Impression Exercise Capacity:  Fair exercise capacity. BP Response:  Normal blood pressure response. Clinical Symptoms:  No chest pain. ECG Impression:  No significant ST segment change suggestive of ischemia. Comparison with Prior Nuclear Study: {No previous study)  Overall Impression:  Normal stress nuclear study.  No evidence of ischemia.  Normal LV function.   Elyn Aquas., MD, [facility]      Myoview is normal.  Symptoms do not appear to be due to blood flow problem.

## 2010-10-30 NOTE — Progress Notes (Signed)
Copy routed to Dr.Ross.Mirna Mires

## 2010-11-01 ENCOUNTER — Telehealth: Payer: Self-pay | Admitting: Internal Medicine

## 2010-11-01 ENCOUNTER — Telehealth: Payer: Self-pay | Admitting: *Deleted

## 2010-11-01 NOTE — Telephone Encounter (Signed)
LOV faxed to [facility] @ [phone]  11/01/10/km

## 2010-11-01 NOTE — Progress Notes (Signed)
Called patient with results of stress test. She will follow up with primary care doctor since she still has chest discomfort.

## 2010-11-08 NOTE — Telephone Encounter (Signed)
Pt. aware of nuc stress test results.

## 2010-11-09 ENCOUNTER — Telehealth: Payer: Self-pay | Admitting: Internal Medicine

## 2010-11-09 NOTE — Telephone Encounter (Signed)
Stress faxed to [facility] @ [phone]  11/09/10/km

## 2010-11-19 ENCOUNTER — Encounter: Payer: Self-pay | Admitting: Internal Medicine

## 2010-12-20 ENCOUNTER — Encounter (HOSPITAL_BASED_OUTPATIENT_CLINIC_OR_DEPARTMENT_OTHER): Payer: 59 | Admitting: Oncology

## 2010-12-20 ENCOUNTER — Other Ambulatory Visit: Payer: Self-pay | Admitting: Oncology

## 2010-12-20 DIAGNOSIS — G5790 Unspecified mononeuropathy of unspecified lower limb: Secondary | ICD-10-CM

## 2010-12-20 DIAGNOSIS — C50119 Malignant neoplasm of central portion of unspecified female breast: Secondary | ICD-10-CM

## 2010-12-20 DIAGNOSIS — Z170 Estrogen receptor positive status [ER+]: Secondary | ICD-10-CM

## 2010-12-20 DIAGNOSIS — G5690 Unspecified mononeuropathy of unspecified upper limb: Secondary | ICD-10-CM

## 2010-12-20 LAB — CBC WITH DIFFERENTIAL/PLATELET
BASO%: 0.4 % (ref 0.0–2.0)
EOS%: 0.7 % (ref 0.0–7.0)
MCH: 31.6 pg (ref 25.1–34.0)
MCHC: 33.9 g/dL (ref 31.5–36.0)
NEUT%: 54.7 % (ref 38.4–76.8)
RBC: 4.4 10*6/uL (ref 3.70–5.45)
RDW: 12.8 % (ref 11.2–14.5)
lymph#: 1.2 10*3/uL (ref 0.9–3.3)

## 2010-12-20 LAB — COMPREHENSIVE METABOLIC PANEL
ALT: 13 U/L (ref 0–35)
AST: 20 U/L (ref 0–37)
Calcium: 10.5 mg/dL (ref 8.4–10.5)
Chloride: 103 mEq/L (ref 96–112)
Creatinine, Ser: 1.05 mg/dL (ref 0.50–1.10)
Potassium: 3.8 mEq/L (ref 3.5–5.3)
Sodium: 138 mEq/L (ref 135–145)

## 2011-01-08 ENCOUNTER — Ambulatory Visit (HOSPITAL_COMMUNITY)
Admission: RE | Admit: 2011-01-08 | Discharge: 2011-01-08 | Disposition: A | Discharge status: Home / Self Care | Payer: 59 | Source: Ambulatory Visit | Attending: Oncology | Admitting: Oncology

## 2011-01-08 ENCOUNTER — Other Ambulatory Visit: Payer: Self-pay | Admitting: Oncology

## 2011-01-08 DIAGNOSIS — Q7649 Other congenital malformations of spine, not associated with scoliosis: Secondary | ICD-10-CM | POA: Insufficient documentation

## 2011-01-08 DIAGNOSIS — I10 Essential (primary) hypertension: Secondary | ICD-10-CM | POA: Insufficient documentation

## 2011-01-08 DIAGNOSIS — Z853 Personal history of malignant neoplasm of breast: Secondary | ICD-10-CM

## 2011-05-20 ENCOUNTER — Other Ambulatory Visit: Payer: Self-pay

## 2011-05-20 DIAGNOSIS — C50919 Malignant neoplasm of unspecified site of unspecified female breast: Secondary | ICD-10-CM

## 2011-05-20 MED ORDER — LETROZOLE 2.5 MG PO TABS: 2.5000 mg | ORAL_TABLET | Freq: Every day | ORAL | Status: DC

## 2011-06-12 ENCOUNTER — Telehealth: Payer: Self-pay | Admitting: *Deleted

## 2011-06-12 NOTE — Telephone Encounter (Signed)
patient confirmed over the phone the new date and time in 06-2011

## 2011-07-01 ENCOUNTER — Other Ambulatory Visit: Payer: 59 | Admitting: Lab

## 2011-07-01 LAB — CBC WITH DIFFERENTIAL/PLATELET
BASO%: 0.3 % (ref 0.0–2.0)
Basophils Absolute: 0 10e3/uL (ref 0.0–0.1)
EOS%: 1.4 % (ref 0.0–7.0)
Eosinophils Absolute: 0.1 10e3/uL (ref 0.0–0.5)
HCT: 40.3 % (ref 34.8–46.6)
HGB: 14 g/dL (ref 11.6–15.9)
LYMPH%: 25.3 % (ref 14.0–49.7)
MCH: 32.2 pg (ref 25.1–34.0)
MCHC: 34.6 g/dL (ref 31.5–36.0)
MCV: 92.9 fL (ref 79.5–101.0)
MONO#: 0.3 10e3/uL (ref 0.1–0.9)
MONO%: 7.6 % (ref 0.0–14.0)
NEUT#: 2.7 10e3/uL (ref 1.5–6.5)
NEUT%: 65.4 % (ref 38.4–76.8)
Platelets: 158 10e3/uL (ref 145–400)
RBC: 4.34 10e6/uL (ref 3.70–5.45)
RDW: 12.8 % (ref 11.2–14.5)
WBC: 4.1 10e3/uL (ref 3.9–10.3)
lymph#: 1 10e3/uL (ref 0.9–3.3)

## 2011-07-01 LAB — COMPREHENSIVE METABOLIC PANEL
AST: 23 U/L (ref 0–37)
Alkaline Phosphatase: 108 U/L (ref 39–117)
BUN: 16 mg/dL (ref 6–23)
Creatinine, Ser: 1.12 mg/dL — ABNORMAL HIGH (ref 0.50–1.10)
Potassium: 4.3 mEq/L (ref 3.5–5.3)

## 2011-07-08 ENCOUNTER — Ambulatory Visit (HOSPITAL_BASED_OUTPATIENT_CLINIC_OR_DEPARTMENT_OTHER): Payer: 59 | Admitting: Oncology

## 2011-07-08 VITALS — BP 136/81 | HR 96 | Temp 97.7°F | Ht 67.0 in | Wt 149.4 lb

## 2011-07-08 DIAGNOSIS — C50919 Malignant neoplasm of unspecified site of unspecified female breast: Secondary | ICD-10-CM

## 2011-07-08 DIAGNOSIS — C50412 Malignant neoplasm of upper-outer quadrant of left female breast: Secondary | ICD-10-CM

## 2011-07-08 HISTORY — DX: Malignant neoplasm of upper-outer quadrant of left female breast: C50.412

## 2011-07-08 NOTE — Progress Notes (Signed)
IDORTENCIA ASKARI  DOB: 06/12/1945  MR#: [id number]  CSN#: [id number]   Interval History:   The patient returns for followup of her breast cancer. Interval history is generally unremarkable. 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 She enjoyed the holidays, when she got to see all 4 of her granddaughters and need her first grandson who is now 80 months old.  ROS:  She still having mild peripheral neuropathy symptoms. She feels these are getting better. There is no numbness or tingling and no pain. She has some cramps sometimes but then she doesn't drink enough fluid, as she recognizes. She's also not exercising regularly because she is so busy. This time a year she tends to get sinus problems and hoarseness and she is having a little bit of that. Her psoriasis is stable. Her hot flashes are moderate. She has a feeling of bloatedness and some reflux symptoms which are not relieved by Pepcid. Otherwise a detailed review of systems today was stable.  Allergies  Allergen Reactions  . Penicillins   . Sulfa Antibiotics     Current Outpatient Prescriptions  Medication Sig Dispense Refill  . aspirin 81 MG tablet Take 81 mg by mouth daily.        . bisoprolol-hydrochlorothiazide (ZIAC) 5-6.25 MG per tablet Take 1 tablet by mouth daily.        . Cholecalciferol (VITAMIN D-3) 5000 UNITS TABS Take 1 tablet by mouth daily.        . fluticasone (FLONASE) 50 MCG/ACT nasal spray 2 sprays by Nasal route daily.        Marland Kitchen letrozole (FEMARA) 2.5 MG tablet Take 1 tablet (2.5 mg total) by mouth daily.  30 tablet  2  . quinapril (ACCUPRIL) 20 MG tablet Take 10 mg by mouth at bedtime.        . rosuvastatin (CRESTOR) 20 MG tablet Take 10 mg by mouth daily.           Objective:  Filed Vitals:   07/08/11 1143  BP: 136/81  Pulse: 96  Temp: 97.7 F (36.5 C)    BMI: Body mass index is 23.40 kg/(m^2).   ECOG FS:  Physical Exam:   Sclerae unicteric  Oropharynx clear  No peripheral  adenopathy  Lungs clear -- no rales or rhonchi  Heart regular rate and rhythm  Abdomen benign, positive bowel sounds, no tenderness, no masses palpated  MSK no focal spinal tenderness, no peripheral edema  Neuro nonfocal  Breast exam: She status post bilateral mastectomies. There is no evidence of local recurrence  Lab Results:      Chemistry      Component Value Date/Time   NA 141 07/01/2011 0918   K 4.3 07/01/2011 0918   CL 106 07/01/2011 0918   CO2 26 07/01/2011 0918   BUN 16 07/01/2011 0918   CREATININE 1.12* 07/01/2011 0918      Component Value Date/Time   CALCIUM 10.5 07/01/2011 0918   ALKPHOS 108 07/01/2011 0918   AST 23 07/01/2011 0918   ALT 12 07/01/2011 0918   BILITOT 0.7 07/01/2011 0918       Lab Results  Component Value Date   WBC 4.1 07/01/2011   HGB 14.0 07/01/2011   HCT 40.3 07/01/2011   MCV 92.9 07/01/2011   PLT 158 07/01/2011   NEUTROABS 2.7 07/01/2011    Studies/Results:  Chest x-ray on 2011/02/02 showed mild cardiomegaly but otherwise an unremarkable chest x-ray.  Assessment: 66 year old [place] woman status post left  breast biopsy in March 2011 for a high-grade invasive ductal carcinoma with biopsy-proven axillary lymph node involvement at presentation.  ER/PR positive, HER2 negative, with MIB-1 of 56%.  Received neoadjuvant chemotherapy with docetaxel, doxorubicin and cyclophosphamide x4, discontinued due to peripheral neuropathy.  Received 1 cycle of carboplatin and gemcitabine, also stopped due to worsening neuropathy.  Status post bilateral mastectomies and left axillary lymph node dissection in August 2011, the result showing a complete pathologic response in both the breast and the lymph nodes.  Status post radiation therapy, completed November 2011.  Started at that time on letrozole 2.5 mg daily which she tolerates well.  The patient is known to be BRCA 1 and 2 negative.      Plan: The plan is to continue the letrozole for a total of 5 years. I wrote her  a prescription for oh omeprazole at to take at bedtime. She will stop the Pepcid. I think this should take care of her reflux symptoms.  She is going to see me again in 6 months before that visit, close to the 2 year mark from her surgery, we will restage her with a CT scan of the chest.  MAGRINAT,GUSTAV C 07/08/2011

## 2011-08-16 ENCOUNTER — Other Ambulatory Visit: Payer: Self-pay | Admitting: *Deleted

## 2011-08-16 DIAGNOSIS — C50919 Malignant neoplasm of unspecified site of unspecified female breast: Secondary | ICD-10-CM

## 2011-08-16 DIAGNOSIS — C50119 Malignant neoplasm of central portion of unspecified female breast: Secondary | ICD-10-CM

## 2011-08-16 MED ORDER — LETROZOLE 2.5 MG PO TABS: 2.5000 mg | ORAL_TABLET | Freq: Every day | ORAL | Status: DC

## 2011-09-19 ENCOUNTER — Ambulatory Visit (INDEPENDENT_AMBULATORY_CARE_PROVIDER_SITE_OTHER): Payer: Self-pay | Admitting: General Surgery

## 2011-10-01 ENCOUNTER — Encounter (INDEPENDENT_AMBULATORY_CARE_PROVIDER_SITE_OTHER): Payer: Self-pay | Admitting: General Surgery

## 2011-10-01 ENCOUNTER — Ambulatory Visit (INDEPENDENT_AMBULATORY_CARE_PROVIDER_SITE_OTHER): Payer: 59 | Admitting: General Surgery

## 2011-10-01 VITALS — BP 130/78 | HR 72 | Temp 96.8°F | Resp 18 | Ht 66.5 in | Wt 146.5 lb

## 2011-10-01 DIAGNOSIS — C50919 Malignant neoplasm of unspecified site of unspecified female breast: Secondary | ICD-10-CM

## 2011-10-01 NOTE — Progress Notes (Signed)
Shannon Nielsen ID: Shannon Nielsen, female   DOB: Sep 07, 1945, 66 y.o.   MRN: [id number]  Chief Complaint  Shannon Nielsen presents with  . Breast Cancer Long Term Follow Up    HPI Shannon Nielsen is a 66 y.o. female.  She returns for long-term followup regarding her left  breast cancer.  This Shannon Nielsen was diagnosed with cancer of the left breast in March of 2011. A Port-A-Cath was placed and she underwent neoadjuvant chemotherapy for a receptor positive, HER-2-negative, Ki-67 56% tumor.  On January 31, 2010 she underwent bilateral total mastectomy, left axillary lymph node dissection and removal of Port-A-Cath.  In her left breast she had a complete pathologic response, ypT0, ypN0.     BRCA1 and BRCA2 were negative. In her right breast, which was prophylactic, was only benign pathology  She continues to take Femara. She continues to followup with Dr. Darnelle Catalan. She states that he is going to get a CT scan of the chest in July of this year.  She has had a cardiac evaluation by [facility] cardi[facility] which was negative because of some chest tightness. She continued to followup with Dr. Lucky Cowboy. HPI  Past Medical History  Diagnosis Date  . Hypertension   . Chest tightness   . Palpitations   . Hyperlipidemia     Past Surgical History  Procedure Date  . Mastectomy August 2011    Bilateral  . Parathyroidectomy   . Nasal sinus surgery     Family History  Problem Relation Age of Onset  . Diabetes Mother   . Cancer Mother     breast    Social History History  Substance Use Topics  . Smoking status: Never Smoker   . Smokeless tobacco: Never Used  . Alcohol Use: No    Allergies  Allergen Reactions  . Sulfa Antibiotics     Shannon Nielsen does not remember the type of reaction, happened years ago.  Marland Kitchen Penicillins Rash    Current Outpatient Prescriptions  Medication Sig Dispense Refill  . Omeprazole (PRILOSEC PO) Take by mouth as needed.      Marland Kitchen aspirin 81 MG tablet Take 81 mg by mouth daily.         . bisoprolol-hydrochlorothiazide (ZIAC) 5-6.25 MG per tablet Take 1 tablet by mouth daily.        . Cholecalciferol (VITAMIN D-3) 5000 UNITS TABS Take 1 tablet by mouth daily.        . fluticasone (FLONASE) 50 MCG/ACT nasal spray 2 sprays by Nasal route daily.        Marland Kitchen letrozole (FEMARA) 2.5 MG tablet Take 1 tablet (2.5 mg total) by mouth daily.  30 tablet  4  . quinapril (ACCUPRIL) 20 MG tablet Take 10 mg by mouth at bedtime.        . rosuvastatin (CRESTOR) 20 MG tablet Take 10 mg by mouth daily.          Review of Systems Review of Systems  Constitutional: Negative for fever, chills and unexpected weight change.  HENT: Negative for hearing loss, congestion, sore throat, trouble swallowing and voice change.   Eyes: Negative for visual disturbance.  Respiratory: Negative for cough and wheezing.   Cardiovascular: Negative for chest pain, palpitations and leg swelling.  Gastrointestinal: Negative for nausea, vomiting, abdominal pain, diarrhea, constipation, blood in stool, abdominal distention and anal bleeding.  Genitourinary: Negative for hematuria, vaginal bleeding and difficulty urinating.  Musculoskeletal: Negative for arthralgias.  Skin: Negative for rash and wound.  Neurological: Negative for seizures,  syncope and headaches.  Hematological: Negative for adenopathy. Does not bruise/bleed easily.  Psychiatric/Behavioral: Negative for confusion.    Blood pressure 130/78, pulse 72, temperature 96.8 F (36 C), temperature source Temporal, resp. rate 18, height 5' 6.5" (1.689 m), weight 146 lb 8 oz (66.452 kg).  Physical Exam Physical Exam  Constitutional: She is oriented to person, place, and time. She appears well-developed and well-nourished. No distress.  HENT:  Head: Normocephalic and atraumatic.  Eyes: Conjunctivae and EOM are normal. Pupils are equal, round, and reactive to light. Right eye exhibits no discharge. Left eye exhibits no discharge. No scleral icterus.  Neck:  Normal range of motion. Neck supple. No JVD present. No tracheal deviation present. No thyromegaly present.  Cardiovascular: Normal rate, regular rhythm, normal heart sounds and intact distal pulses.   No murmur heard. Pulmonary/Chest: Effort normal and breath sounds normal. No stridor. No respiratory distress. She has no wheezes. She has no rales. She exhibits no tenderness.       Bilateral total mastectomy scars arr well healed. No nodules, no ulceration, no pathologic skin change. No axillary adenopathy. Range of motion both shoulders is 100%. No arm swelling.  Musculoskeletal: Normal range of motion. She exhibits no edema.  Lymphadenopathy:    She has no cervical adenopathy.  Neurological: She is alert and oriented to person, place, and time. She has normal reflexes.  Skin: Skin is warm and dry. No rash noted. She is not diaphoretic. No erythema. No pallor.  Psychiatric: She has a normal mood and affect. Her behavior is normal. Judgment and thought content normal.    Data Reviewed I have reviewed old records and Dr. Darrall Dears office notes.  Assessment    Invasive ductal carcinoma left breast, pretreatment clinical stage T3, N1, upper inner quadrant left breast, receptor positive, HER-2-negative, Ki-67 56%  Complete pathologic response following neoadjuvant chemotherapy  No evidence of recurrence 18 months following bilateral mastectomies and left axillary lymph node dissection. No evidence of local recurrence  BRCA1 and BRCA2 negative    Plan    Continue Femara.  CT chest apparently scheduled July 2013  Continue followup with Dr. Darnelle Catalan.  Return to see me one year for physical exam.       Angelia Mould. Derrell Lolling, M.D., [facility] General and Minimally invasive Surgery Breast and Colorectal Surgery Office:   [phone] Pager:   [phone]  10/01/2011, 4:03 PM

## 2011-10-01 NOTE — Patient Instructions (Signed)
Your physical exam today is normal. There is no evidence of cancer.  We have discussed breast reconstruction today. You have stated that you are still not interested in that at this time.  Keep your appointments with Dr. Darnelle Catalan. Continue taking the Femara.  Return to see Dr. Derrell Lolling in one year for a physical exam.

## 2011-12-16 ENCOUNTER — Other Ambulatory Visit (HOSPITAL_BASED_OUTPATIENT_CLINIC_OR_DEPARTMENT_OTHER): Payer: 59 | Admitting: Lab

## 2011-12-16 ENCOUNTER — Ambulatory Visit (HOSPITAL_COMMUNITY)
Admission: RE | Admit: 2011-12-16 | Discharge: 2011-12-16 | Disposition: A | Discharge status: Home / Self Care | Payer: 59 | Source: Ambulatory Visit | Attending: Oncology | Admitting: Oncology

## 2011-12-16 DIAGNOSIS — J984 Other disorders of lung: Secondary | ICD-10-CM | POA: Insufficient documentation

## 2011-12-16 DIAGNOSIS — C50919 Malignant neoplasm of unspecified site of unspecified female breast: Secondary | ICD-10-CM | POA: Insufficient documentation

## 2011-12-16 DIAGNOSIS — K9281 Gastrointestinal mucositis (ulcerative): Secondary | ICD-10-CM | POA: Insufficient documentation

## 2011-12-16 DIAGNOSIS — I2510 Atherosclerotic heart disease of native coronary artery without angina pectoris: Secondary | ICD-10-CM | POA: Insufficient documentation

## 2011-12-16 LAB — CMP (CANCER CENTER ONLY)
ALT(SGPT): 18 U/L (ref 10–47)
AST: 23 U/L (ref 11–38)
BUN, Bld: 14 mg/dL (ref 7–22)
Creat: 1.1 mg/dl (ref 0.6–1.2)
Total Bilirubin: 1.1 mg/dl (ref 0.20–1.60)

## 2011-12-16 LAB — CBC WITH DIFFERENTIAL/PLATELET
BASO%: 0.5 % (ref 0.0–2.0)
EOS%: 0.5 % (ref 0.0–7.0)
HCT: 40.4 % (ref 34.8–46.6)
LYMPH%: 35.4 % (ref 14.0–49.7)
MCH: 31 pg (ref 25.1–34.0)
MCHC: 33.3 g/dL (ref 31.5–36.0)
MCV: 93.1 fL (ref 79.5–101.0)
MONO%: 9.9 % (ref 0.0–14.0)
NEUT%: 53.7 % (ref 38.4–76.8)
Platelets: 176 10*3/uL (ref 145–400)

## 2011-12-16 MED ORDER — IOHEXOL 300 MG/ML  SOLN
80.0000 mL | Freq: Once | INTRAMUSCULAR | Status: AC | PRN
  Administered 2011-12-16: 80 mL via INTRAVENOUS

## 2011-12-23 ENCOUNTER — Telehealth: Payer: Self-pay | Admitting: Oncology

## 2011-12-23 ENCOUNTER — Ambulatory Visit (HOSPITAL_BASED_OUTPATIENT_CLINIC_OR_DEPARTMENT_OTHER): Payer: 59 | Admitting: Oncology

## 2011-12-23 VITALS — BP 144/82 | HR 80 | Temp 97.8°F | Ht 66.5 in | Wt 148.5 lb

## 2011-12-23 DIAGNOSIS — C773 Secondary and unspecified malignant neoplasm of axilla and upper limb lymph nodes: Secondary | ICD-10-CM

## 2011-12-23 DIAGNOSIS — C50919 Malignant neoplasm of unspecified site of unspecified female breast: Secondary | ICD-10-CM

## 2011-12-23 DIAGNOSIS — C50419 Malignant neoplasm of upper-outer quadrant of unspecified female breast: Secondary | ICD-10-CM

## 2011-12-23 NOTE — Progress Notes (Signed)
Shannon [facility]   DOB: 07/26/1945  MR#: [id number]  WUJ#:[id number]  HISTORY OF PRESENT ILLNESS: Mrs. Buchta had screening mammography October 03, 2008 which was unremarkable.  Towards the end of that year or early the next year, she noticed a little bit of discomfort and itching in both breasts, particularly around the nipple.  She brought this to Dr. Kathryne Sharper attention and was referred for a diagnostic mammography on August 28, 2009.  On exam, Dr. Judyann Munson found diffuse firmness of the entire left breast with skin thickening.  By mammography, the left breast showed increased trabecular thickening and an ill-defined mass with developing calcifications in the upper outer quadrant.  The ultrasound on the same day showed in the left breast an irregular hypoechoic mass measuring 2.0 cm.  In the axilla on the left side there was at least one enlarged lymph node which measured 2 cm.  Biopsy was performed the same day of both the breast and the axilla and this showed ([id number]), an invasive ductal carcinoma, high grade.  The lymph node was also involved.  The tumor was estrogen receptor positive at 84%, progesterone receptor positive at 71% with an elevated MIB-1 of 56%.  There was no amplification of HER2/neu with a ratio of 1.32 by CISH.  Bilateral breast MRIs were obtained on March 24.  This showed a symmetric edema throughout the left breast with skin thickening, most prominently anteriorly near the nipple.  Otherwise non-mass like enhancement involving the majority of the left breast including all four quadrants, in the upper inner left breast, there was an ill defined more mass like area which measured up to 2.5 cm, corresponding to the abnormality seen by ultrasound.  In total the abnormal area of enhancement measuring 7.3 cm in largest dimension.  There was also abnormal enhancement of the thickened skin in the anterior left breast.  The pectoralis muscle did not appear to be involved.  On the right side  there were some cysts and in the axilla there was one enlarged left axillary lymph node.  In addition, an incidental finding in the right lobe of the liver was a T2 bright lesion measuring approximately 1 cm.  Her subsequent history is as detailed below. . INTERVAL HISTORY: Shannon [facility] returns for followup of her breast cancer. Overall she is doing "fine". 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 She now has 2 grandchildren and she was just within this last weekend. Her mother is 76 currently residing at [facility].  REVIEW OF SYSTEMS: The review of systems is generally benign. She has been diagnosed with glaucoma and has some difficulty getting the eyedrops to land where they show that she tells me. She continues to have heartburn problems. She never started the Prilosec. She'll has more gas than she would like. We talked about types of food that may cause that, beginning with dairy products, and she should keep a food diary to figure out which particular foods make the problem worse. She is still having hot flashes but no more frequently than before. She does a little walking at work most days, but his thinking of joining a gym. Otherwise a detailed review of systems today was entirely noncontributory.   PAST MEDICAL HISTORY: Past Medical History  Diagnosis Date  . Hypertension   . Chest tightness   . Palpitations   . Hyperlipidemia   history of chronic sinusitis, history of hemorrhoids, history of bilateral tubal ligation,  history of prior  multiple breast biopsies.  PAST SURGICAL HISTORY: Past Surgical History  Procedure Date  . Mastectomy August 2011    Bilateral  . Parathyroidectomy   . Nasal sinus surgery     FAMILY HISTORY Family History  Problem Relation Age of Onset  . Diabetes Mother   . Cancer Mother     breast  The patient's father died at the age of 24 from complications of atherosclerotic coronary vascular disease.  The  patient's mother is alive at 51 and the patient is engaged on a daily basis in her care, although the patient's mother does have a four-hour nurse who comes daily, and a Press photographer who stays with her at night.  The patient's mother had breast cancer diagnosed at age 26.  The patient has no sisters.  She has two brothers, one of them has sickle cell trait, the other one has a history of prostate cancer.  The only breast cancers in the family that she is aware of are two cousins on the father's side, one of whom may have been diagnosed before the age of 64.  There was also a maternal aunt who may have been diagnosed with ovarian cancer at the age of 72, but this is not clear.  GYNECOLOGIC HISTORY: She is Gx, P1, first pregnancy to term age 89.  She went through the change of life at 47.  She did not take hormone replacement therapy.  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  Her husband of >20 years, Doreatha Martin, retired from [employer] and currently works as a Primary school teacher (commutes).  I should add that I saw [name] remotely for what proved to be a history of glomerulonephritis.  [name] was my patient, Yannet Rincon, and Doreatha Martin and [name]'s daughter, Renea Ee, is Laretta's stepdaughter.  Renea Ee works as an Astronomer. at [employer]  [name];y's biological son is Charlestine Night; he works in Community education officer and part time as a Leisure centre manager.  Velora has two biological and three step-grandchildren.  She is a International aid/development worker   ADVANCED DIRECTIVES:  HEALTH MAINTENANCE: History  Substance Use Topics  . Smoking status: Never Smoker   . Smokeless tobacco: Never Used  . Alcohol Use: No     Colonoscopy:  PAP:  Bone density:  Lipid panel:  Allergies  Allergen Reactions  . Sulfa Antibiotics     Patient does not remember the type of reaction, happened years ago.  Marland Kitchen Penicillins Rash    Current Outpatient Prescriptions  Medication Sig Dispense Refill  . aspirin 81 MG tablet Take 81 mg by mouth  daily.        . bisoprolol-hydrochlorothiazide (ZIAC) 5-6.25 MG per tablet Take 1 tablet by mouth daily.        Marland Kitchen latanoprost (XALATAN) 0.005 % ophthalmic solution       . letrozole (FEMARA) 2.5 MG tablet Take 1 tablet (2.5 mg total) by mouth daily.  30 tablet  4  . Omeprazole (PRILOSEC PO) Take by mouth as needed.      . quinapril (ACCUPRIL) 20 MG tablet Take 10 mg by mouth at bedtime.        . rosuvastatin (CRESTOR) 20 MG tablet Take 10 mg by mouth daily.        . Cholecalciferol (VITAMIN D-3) 5000 UNITS TABS Take 1 tablet by mouth daily.        . fluticasone (FLONASE) 50 MCG/ACT nasal spray 2 sprays by Nasal route daily.  OBJECTIVE: middle-aged African American woman who appears well Filed Vitals:   12/23/11 1559  BP: 144/82  Pulse: 80  Temp: 97.8 F (36.6 C)     Body mass index is 23.61 kg/(m^2).    ECOG FS: 0  Sclerae unicteric Oropharynx clear No cervical or supraclavicular adenopathy Lungs no rales or rhonchi Heart regular rate and rhythm Abd benign MSK no focal spinal tenderness, no peripheral edema Neuro: nonfocal Breasts:  She status post bilateral mastectomies. There is no evidence of local recurrence. The left axilla is unremarkable.  LAB RESULTS: Lab Results  Component Value Date   WBC 3.8* 12/16/2011   NEUTROABS 2.0 12/16/2011   HGB 13.5 12/16/2011   HCT 40.4 12/16/2011   MCV 93.1 12/16/2011   PLT 176 12/16/2011      Chemistry      Component Value Date/Time   NA 138 12/16/2011 1410   NA 141 07/01/2011 0918   K 4.0 12/16/2011 1410   K 4.3 07/01/2011 0918   CL 100 12/16/2011 1410   CL 106 07/01/2011 0918   CO2 31 12/16/2011 1410   CO2 26 07/01/2011 0918   BUN 14 12/16/2011 1410   BUN 16 07/01/2011 0918   CREATININE 1.1 12/16/2011 1410   CREATININE 1.12* 07/01/2011 0918      Component Value Date/Time   CALCIUM 9.7 12/16/2011 1410   CALCIUM 10.5 07/01/2011 0918   ALKPHOS 117* 12/16/2011 1410   ALKPHOS 108 07/01/2011 0918   AST 23 12/16/2011 1410   AST 23 07/01/2011 0918    ALT 12 07/01/2011 0918   BILITOT 1.10 12/16/2011 1410   BILITOT 0.7 07/01/2011 0918       Lab Results  Component Value Date   LABCA2 17 07/01/2011    No components found with this basename: [id number]    No results found for this basename: INR:1;PROTIME:1 in the last 168 hours  Urinalysis    Component Value Date/Time   COLORURINE YELLOW 01/26/2010 1600   APPEARANCEUR CLEAR 01/26/2010 1600   LABSPEC 1.013 01/26/2010 1600   PHURINE 5.5 01/26/2010 1600   GLUCOSEU NEGATIVE 01/26/2010 1600   HGBUR NEGATIVE 01/26/2010 1600   BILIRUBINUR NEGATIVE 01/26/2010 1600   KETONESUR NEGATIVE 01/26/2010 1600   PROTEINUR NEGATIVE 01/26/2010 1600   UROBILINOGEN 0.2 01/26/2010 1600   NITRITE NEGATIVE 01/26/2010 1600   LEUKOCYTESUR SMALL* 01/26/2010 1600    STUDIES: Ct Chest W Contrast  12/16/2011  *RADIOLOGY REPORT*  Clinical Data: Restaging breast cancer  CT CHEST WITH CONTRAST  Technique:  Multidetector CT imaging of the chest was performed following the standard protocol during bolus administration of intravenous contrast.  Contrast: 80mL OMNIPAQUE IOHEXOL 300 MG/ML  SOLN  Comparison: 09/14/2009  Findings: Bilateral mastectomies have been performed.  Previous left axillary lymph node dissection.  No enlarged mediastinal or hilar lymph nodes.  No pericardial or pleural effusions. Calcifications within the LAD and left circumflex coronary artery noted.  RCA calcifications are also present.  Scarring is noted within the right middle lobe and right base.  No suspicious pulmonary nodule or mass.  No worrisome lytic or sclerotic bone lesions.  Limited imaging through the upper abdomen shows a peripherally enhancing lesion within the right hepatic lobe, image number 46. Favor benign liver hemangioma.  IMPRESSION:  1.  No acute findings. 2.  No specific features to suggest metastatic disease 3.  Prominent coronary artery calcifications.  Original Report Authenticated By: Rosealee Albee, M.D.    ASSESSMENT: 66 y.o. BRCA  1/2 negative [place] woman   (  1) status post left breast biopsy in March 2011 for a high-grade invasive ductal carcinoma, with biopsy-proven axillary lymph node involvement at presentation. ER/PR positive, HER2 negative, with MIB-1 of 56%.   (2) received neoadjuvant chemotherapy with docetaxel, doxorubicin and cyclophosphamide x4, discontinued due to peripheral neuropathy.  (3) received 1 cycle of carboplatin and gemcitabine, also stopped due to worsening neuropathy.   (4) status post bilateral mastectomies and left axillary lymph node dissection in August 2011, the result showing a complete pathologic response in both the breast and the lymph nodes  (5) completed radiation therapy November 2011  (6) started letrozole November 2011    PLAN: she never started Prilosec so she still having the same reflux problems of discussed before. She may try simethicone for gas problem. She is concerned about the slight elevation in the alkaline phosphatase. The chest CT is very reassuring in this regard. Most likely this is due to the Crestor she is taking and it certainly is not a reason to discontinue that medication. She sees Dr. Oneta Rack quarterly, so she will start seeing [place] on a every 6 month basis. She knows to call for any problems that may develop before the next visit.  Chriss Mannan C    12/23/2011

## 2011-12-23 NOTE — Telephone Encounter (Signed)
gve the pt her feb 2014 appt calendar °

## 2011-12-24 ENCOUNTER — Ambulatory Visit (INDEPENDENT_AMBULATORY_CARE_PROVIDER_SITE_OTHER): Payer: Self-pay

## 2012-01-16 ENCOUNTER — Other Ambulatory Visit: Payer: Self-pay | Admitting: *Deleted

## 2012-01-16 DIAGNOSIS — C50919 Malignant neoplasm of unspecified site of unspecified female breast: Secondary | ICD-10-CM

## 2012-01-16 MED ORDER — LETROZOLE 2.5 MG PO TABS: 2.5000 mg | ORAL_TABLET | Freq: Every day | ORAL | Status: DC

## 2012-03-02 ENCOUNTER — Other Ambulatory Visit (HOSPITAL_COMMUNITY): Payer: Self-pay | Admitting: Internal Medicine

## 2012-03-02 ENCOUNTER — Ambulatory Visit (HOSPITAL_COMMUNITY)
Admission: RE | Admit: 2012-03-02 | Discharge: 2012-03-02 | Disposition: A | Discharge status: Home / Self Care | Payer: 59 | Source: Ambulatory Visit | Attending: Internal Medicine | Admitting: Internal Medicine

## 2012-03-02 DIAGNOSIS — M79609 Pain in unspecified limb: Secondary | ICD-10-CM | POA: Insufficient documentation

## 2012-03-02 DIAGNOSIS — M47817 Spondylosis without myelopathy or radiculopathy, lumbosacral region: Secondary | ICD-10-CM | POA: Insufficient documentation

## 2012-03-02 DIAGNOSIS — M549 Dorsalgia, unspecified: Secondary | ICD-10-CM

## 2012-03-02 DIAGNOSIS — M545 Low back pain, unspecified: Secondary | ICD-10-CM | POA: Insufficient documentation

## 2012-03-03 ENCOUNTER — Other Ambulatory Visit: Payer: Self-pay | Admitting: Internal Medicine

## 2012-03-04 ENCOUNTER — Other Ambulatory Visit: Payer: Self-pay | Admitting: Internal Medicine

## 2012-03-04 DIAGNOSIS — R140 Abdominal distension (gaseous): Secondary | ICD-10-CM

## 2012-03-04 DIAGNOSIS — R109 Unspecified abdominal pain: Secondary | ICD-10-CM

## 2012-03-09 ENCOUNTER — Ambulatory Visit
Admission: RE | Admit: 2012-03-09 | Discharge: 2012-03-09 | Disposition: A | Discharge status: Home / Self Care | Payer: 59 | Source: Ambulatory Visit | Attending: Internal Medicine | Admitting: Internal Medicine

## 2012-03-09 DIAGNOSIS — R140 Abdominal distension (gaseous): Secondary | ICD-10-CM

## 2012-03-09 DIAGNOSIS — R109 Unspecified abdominal pain: Secondary | ICD-10-CM

## 2012-07-21 ENCOUNTER — Other Ambulatory Visit: Payer: 59 | Admitting: Lab

## 2012-07-28 ENCOUNTER — Encounter: Payer: Self-pay | Admitting: Physician Assistant

## 2012-07-28 ENCOUNTER — Ambulatory Visit (HOSPITAL_BASED_OUTPATIENT_CLINIC_OR_DEPARTMENT_OTHER): Payer: 59 | Admitting: Physician Assistant

## 2012-07-28 ENCOUNTER — Other Ambulatory Visit (HOSPITAL_BASED_OUTPATIENT_CLINIC_OR_DEPARTMENT_OTHER): Payer: 59 | Admitting: Lab

## 2012-07-28 VITALS — BP 151/88 | HR 79 | Temp 97.9°F | Resp 20 | Ht 66.5 in | Wt 147.5 lb

## 2012-07-28 DIAGNOSIS — C50219 Malignant neoplasm of upper-inner quadrant of unspecified female breast: Secondary | ICD-10-CM

## 2012-07-28 DIAGNOSIS — R61 Generalized hyperhidrosis: Secondary | ICD-10-CM

## 2012-07-28 DIAGNOSIS — C50919 Malignant neoplasm of unspecified site of unspecified female breast: Secondary | ICD-10-CM

## 2012-07-28 DIAGNOSIS — C773 Secondary and unspecified malignant neoplasm of axilla and upper limb lymph nodes: Secondary | ICD-10-CM

## 2012-07-28 DIAGNOSIS — K219 Gastro-esophageal reflux disease without esophagitis: Secondary | ICD-10-CM

## 2012-07-28 LAB — COMPREHENSIVE METABOLIC PANEL (CC13)
ALT: 14 U/L (ref 0–55)
AST: 20 U/L (ref 5–34)
Albumin: 4.2 g/dL (ref 3.5–5.0)
Alkaline Phosphatase: 138 U/L (ref 40–150)
BUN: 16.5 mg/dL (ref 7.0–26.0)
CO2: 28 mEq/L (ref 22–29)
Calcium: 11.2 mg/dL — ABNORMAL HIGH (ref 8.4–10.4)
Chloride: 104 mEq/L (ref 98–107)
Creatinine: 1.2 mg/dL — ABNORMAL HIGH (ref 0.6–1.1)
Glucose: 103 mg/dl — ABNORMAL HIGH (ref 70–99)
Potassium: 4.1 mEq/L (ref 3.5–5.1)
Sodium: 141 mEq/L (ref 136–145)
Total Bilirubin: 0.82 mg/dL (ref 0.20–1.20)
Total Protein: 7.7 g/dL (ref 6.4–8.3)

## 2012-07-28 LAB — CBC WITH DIFFERENTIAL/PLATELET
BASO%: 0.3 % (ref 0.0–2.0)
EOS%: 0.4 % (ref 0.0–7.0)
MCH: 31.2 pg (ref 25.1–34.0)
MCHC: 34.1 g/dL (ref 31.5–36.0)
NEUT%: 56.6 % (ref 38.4–76.8)
RBC: 4.75 10*6/uL (ref 3.70–5.45)
RDW: 13 % (ref 11.2–14.5)
lymph#: 1.6 10*3/uL (ref 0.9–3.3)

## 2012-07-28 MED ORDER — GABAPENTIN 300 MG PO CAPS: 300.0000 mg | ORAL_CAPSULE | Freq: Every evening | ORAL | Status: DC | PRN

## 2012-07-28 MED ORDER — OMEPRAZOLE 20 MG PO CPDR: 20.0000 mg | DELAYED_RELEASE_CAPSULE | Freq: Two times a day (BID) | ORAL | Status: DC

## 2012-07-28 MED ORDER — LETROZOLE 2.5 MG PO TABS: 2.5000 mg | ORAL_TABLET | Freq: Every day | ORAL | Status: DC

## 2012-07-28 NOTE — Progress Notes (Signed)
Shannon Nielsen   DOB: 08/19/1945  MR#: [id number]  WUJ#:[id number]  HISTORY OF PRESENT ILLNESS: [name] had screening mammography October 03, 2008 which was unremarkable.  Towards the end of that year or early the next year, she noticed a little bit of discomfort and itching in both breasts, particularly around the nipple.  She brought this to Dr. Kathryne Sharper attention and was referred for a diagnostic mammography on August 28, 2009.  On exam, Dr. Judyann Munson found diffuse firmness of the entire left breast with skin thickening.  By mammography, the left breast showed increased trabecular thickening and an ill-defined mass with developing calcifications in the upper outer quadrant.  The ultrasound on the same day showed in the left breast an irregular hypoechoic mass measuring 2.0 cm.  In the axilla on the left side there was at least one enlarged lymph node which measured 2 cm.  Biopsy was performed the same day of both the breast and the axilla and this showed ([id number]), an invasive ductal carcinoma, high grade.  The lymph node was also involved.  The tumor was estrogen receptor positive at 84%, progesterone receptor positive at 71% with an elevated MIB-1 of 56%.  There was no amplification of HER2/neu with a ratio of 1.32 by CISH.  Bilateral breast MRIs were obtained on March 24.  This showed a symmetric edema throughout the left breast with skin thickening, most prominently anteriorly near the nipple.  Otherwise non-mass like enhancement involving the majority of the left breast including all four quadrants, in the upper inner left breast, there was an ill defined more mass like area which measured up to 2.5 cm, corresponding to the abnormality seen by ultrasound.  In total the abnormal area of enhancement measuring 7.3 cm in largest dimension.  There was also abnormal enhancement of the thickened skin in the anterior left breast.  The pectoralis muscle did not appear to be involved.  On the right side  there were some cysts and in the axilla there was one enlarged left axillary lymph node.  In addition, an incidental finding in the right lobe of the liver was a T2 bright lesion measuring approximately 1 cm.  Her subsequent history is as detailed below. . INTERVAL HISTORY: Shannon Nielsen returns for routine followup of her left breast cancer. She continues on letrozole with good tolerance.  She continues to have hot flashes.  She has occasional back pain which has been evaluated and found to be associated with degenerative changes.  She has no problems with vaginal dryness and no vaginal bleeding.  She does continue to have some pain and what feels like muscle spasms in the chest wall bilaterally. She also has some epigastric pressure associated with belching, and admits that she never began on Prilosec as previously recommended.  She tells me she has had a complete cardiac workup within the last couple of years, and it was unremarkable  Interval history is remarkable for the fact that Shannon Nielsen's 14 year old mother is currently in the [facility] with nausea and vomiting. She has been residing at [facility] where there was recently an outbreak of a "stomach virus", but they are also checking a head CT to make sure she has not had a stroke.   REVIEW OF SYSTEMS: Shannon Nielsen  denies any fevers or chills. She's had no rashes or skin changes and denies any abnormal bruising or bleeding. She's eating and drinking well with no nausea and no change in bowel or bladder habits. She denies  any cough, phlegm production, increased shortness of breath, or chest pain other than the postsurgical pain in the chest wall as noted above. She's had no additional myalgias, arthralgias, or bony pain denies any peripheral swelling. She's had no abnormal headaches, dizziness, or change in vision.  A detailed review of systems is otherwise noncontributory.    PAST MEDICAL HISTORY: Past Medical History  Diagnosis Date  . Hypertension   .  Chest tightness   . Palpitations   . Hyperlipidemia   history of chronic sinusitis, history of hemorrhoids, history of bilateral tubal ligation,  history of prior multiple breast biopsies.  PAST SURGICAL HISTORY: Past Surgical History  Procedure Laterality Date  . Mastectomy  August 2011    Bilateral  . Parathyroidectomy    . Nasal sinus surgery      FAMILY HISTORY Family History  Problem Relation Age of Onset  . Diabetes Mother   . Cancer Mother     breast  The patient's father died at the age of 49 from complications of atherosclerotic coronary vascular disease.  The patient's mother is alive at 31 and the patient is engaged on a daily basis in her care, although the patient's mother does have a four-hour nurse who comes daily, and a Press photographer who stays with her at [facility].  The patient's mother had breast cancer diagnosed at age 55.  The patient has no sisters.  She has two brothers, one of them has sickle cell trait, the other one has a history of prostate cancer.  The only breast cancers in the family that she is aware of are two cousins on the father's side, one of whom may have been diagnosed before the age of 42.  There was also a maternal aunt who may have been diagnosed with ovarian cancer at the age of 70, but this is not clear.  GYNECOLOGIC HISTORY: She is Gx, P1, first pregnancy to term age 16.  She went through the change of life at 25.  She did not take hormone replacement therapy.  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  Her husband of >20 years, Doreatha Martin, retired from [employer] and currently works as a Primary school teacher (commutes).  I should add that I saw [name] remotely for what proved to be a history of glomerulonephritis.  [name] was my patient, Shannon Nielsen, and Doreatha Martin and [name]'s daughter, Shannon Nielsen, is Shannon Nielsen's stepdaughter.  Shannon Nielsen works as an Astronomer. at [employer]  [name];y's biological son is Shannon Nielsen; he works in Community education officer and  part time as a Leisure centre manager.  Nathania has two biological and three step-grandchildren.  She is a International aid/development worker   ADVANCED DIRECTIVES:  HEALTH MAINTENANCE: History  Substance Use Topics  . Smoking status: Never Smoker   . Smokeless tobacco: Never Used  . Alcohol Use: No     Colonoscopy:  PAP:  Bone density: Scheduled for March 2014  Lipid panel:  UTD, Dr. Oneta Rack  Allergies  Allergen Reactions  . Sulfa Antibiotics     Patient does not remember the type of reaction, happened years ago.  Marland Kitchen Penicillins Rash    Current Outpatient Prescriptions  Medication Sig Dispense Refill  . aspirin 81 MG tablet Take 81 mg by mouth daily.        . bisoprolol-hydrochlorothiazide (ZIAC) 5-6.25 MG per tablet Take 1 tablet by mouth daily.        . Cholecalciferol (VITAMIN D-3) 5000 UNITS TABS Take 1 tablet by mouth daily.        Marland Kitchen  fluticasone (FLONASE) 50 MCG/ACT nasal spray 2 sprays by Nasal route daily.        Marland Kitchen latanoprost (XALATAN) 0.005 % ophthalmic solution       . letrozole (FEMARA) 2.5 MG tablet Take 1 tablet (2.5 mg total) by mouth daily.  30 tablet  12  . quinapril (ACCUPRIL) 20 MG tablet Take 10 mg by mouth at bedtime.        . rosuvastatin (CRESTOR) 20 MG tablet Take 10 mg by mouth daily.        Marland Kitchen tetrahydrozoline (EYE DROPS) 0.05 % ophthalmic solution Place 1 drop into both eyes daily.      Marland Kitchen gabapentin (NEURONTIN) 300 MG capsule Take 1 capsule (300 mg total) by mouth at bedtime as needed (postsurgical pain and/or hot flashes).  30 capsule  3  . omeprazole (PRILOSEC) 20 MG capsule Take 1 capsule (20 mg total) by mouth 2 (two) times daily.  60 capsule  3   No current facility-administered medications for this visit.    OBJECTIVE: middle-aged African American woman who appears well Filed Vitals:   07/28/12 1509  BP: 151/88  Pulse: 79  Temp: 97.9 F (36.6 C)  Resp: 20     Body mass index is 23.45 kg/(m^2).    ECOG FS: 0 Filed Weights   07/28/12 1509  Weight: 147 lb 8 oz (66.906 kg)    Sclerae unicteric Oropharynx clear No cervical or supraclavicular adenopathy Lungs clear to auscultation, no rales or rhonchi Heart regular rate and rhythm Abdomen soft, nontender, positive bowel sounds MSK no focal spinal tenderness, no peripheral edema Neuro: nonfocal, well oriented with positive affect Breasts:  She status post bilateral mastectomies. There is no evidence of local recurrence. Axillae are benign bilaterally with no palpable adenopathy   LAB RESULTS: Lab Results  Component Value Date   WBC 4.7 07/28/2012   NEUTROABS 2.7 07/28/2012   HGB 14.8 07/28/2012   HCT 43.4 07/28/2012   MCV 91.4 07/28/2012   PLT 181 07/28/2012      Chemistry      Component Value Date/Time   NA 141 07/28/2012 1437   NA 138 12/16/2011 1410   NA 141 07/01/2011 0918   K 4.1 07/28/2012 1437   K 4.0 12/16/2011 1410   K 4.3 07/01/2011 0918   CL 104 07/28/2012 1437   CL 100 12/16/2011 1410   CL 106 07/01/2011 0918   CO2 28 07/28/2012 1437   CO2 31 12/16/2011 1410   CO2 26 07/01/2011 0918   BUN 16.5 07/28/2012 1437   BUN 14 12/16/2011 1410   BUN 16 07/01/2011 0918   CREATININE 1.2* 07/28/2012 1437   CREATININE 1.1 12/16/2011 1410   CREATININE 1.12* 07/01/2011 0918      Component Value Date/Time   CALCIUM 11.2* 07/28/2012 1437   CALCIUM 9.7 12/16/2011 1410   CALCIUM 10.5 07/01/2011 0918   ALKPHOS 138 07/28/2012 1437   ALKPHOS 117* 12/16/2011 1410   ALKPHOS 108 07/01/2011 0918   AST 20 07/28/2012 1437   AST 23 12/16/2011 1410   AST 23 07/01/2011 0918   ALT 14 07/28/2012 1437   ALT 12 07/01/2011 0918   BILITOT 0.82 07/28/2012 1437   BILITOT 1.10 12/16/2011 1410   BILITOT 0.7 07/01/2011 0918       Lab Results  Component Value Date   LABCA2 17 07/01/2011    STUDIES: Ct Chest W Contrast  12/16/2011  *RADIOLOGY REPORT*  Clinical Data: Restaging breast cancer  CT CHEST WITH CONTRAST  Technique:  Multidetector CT imaging of the chest was performed following the standard protocol during bolus administration of  intravenous contrast.  Contrast: 80mL OMNIPAQUE IOHEXOL 300 MG/ML  SOLN  Comparison: 09/14/2009  Findings: Bilateral mastectomies have been performed.  Previous left axillary lymph node dissection.  No enlarged mediastinal or hilar lymph nodes.  No pericardial or pleural effusions. Calcifications within the LAD and left circumflex coronary artery noted.  RCA calcifications are also present.  Scarring is noted within the right middle lobe and right base.  No suspicious pulmonary nodule or mass.  No worrisome lytic or sclerotic bone lesions.  Limited imaging through the upper abdomen shows a peripherally enhancing lesion within the right hepatic lobe, image number 46. Favor benign liver hemangioma.  IMPRESSION:  1.  No acute findings. 2.  No specific features to suggest metastatic disease 3.  Prominent coronary artery calcifications.  Original Report Authenticated By: Rosealee Albee, M.D.    ASSESSMENT: 67 y.o. BRCA 1/2 negative [place] woman   (1) status post left breast biopsy in March 2011 for a high-grade invasive ductal carcinoma, with biopsy-proven axillary lymph node involvement at presentation. ER/PR positive, HER2 negative, with MIB-1 of 56%.   (2) received neoadjuvant chemotherapy with docetaxel, doxorubicin and cyclophosphamide x4, discontinued due to peripheral neuropathy.  (3) received 1 cycle of carboplatin and gemcitabine, also stopped due to worsening neuropathy.   (4) status post bilateral mastectomies and left axillary lymph node dissection in August 2011, the result showing a complete pathologic response in both the breast and the lymph nodes  (5) completed radiation therapy November 2011  (6) started letrozole November 2011    PLAN:  Overall, Shannon Nielsen is doing well.  She will continue on letrozole as before, 2.5 mg daily.  I have prescribed gabapentin to take at [facility] in the hope this will help not only her hot flashes but also what appears to be some postsurgical discomfort in  the chest wall. I also encouraged her to take the omeprazole daily for her reflux.    I have reviewed this case with Dr. Darnelle Catalan today, and we are ordering a parathyroid hormone level to further evaluate her hypercalcemia. She does have a remote history of hypercalcemia and is status post parathyroidectomy approximately 12 years ago.   Finally, chem is already scheduled for a bone density in the next month. She'll continue to be followed closely by Dr.  Oneta Rack whom she sees quarterly. We will see her again in 6 months for repeat labs and physical exam.  Chem voices understanding and agreement with this plan and will call with any changes or problems.   Jaquilla Woodroof    07/28/2012

## 2012-07-29 ENCOUNTER — Telehealth: Payer: Self-pay | Admitting: *Deleted

## 2012-07-29 ENCOUNTER — Telehealth: Payer: Self-pay | Admitting: Oncology

## 2012-07-29 NOTE — Telephone Encounter (Signed)
S/w the pt and she is aware of her aug appts. °

## 2012-07-29 NOTE — Telephone Encounter (Signed)
Received call from patient wanting to know the results of her labs.  Informed patient that her parathyroid seems to be hyperactive again and may need to be removed.  Referral made to Dr. Gerrit Friends. Pt voiced understanding.

## 2012-08-04 ENCOUNTER — Telehealth: Payer: Self-pay | Admitting: *Deleted

## 2012-08-04 ENCOUNTER — Other Ambulatory Visit: Payer: Self-pay | Admitting: *Deleted

## 2012-08-04 ENCOUNTER — Encounter (INDEPENDENT_AMBULATORY_CARE_PROVIDER_SITE_OTHER): Payer: Self-pay | Admitting: General Surgery

## 2012-08-04 ENCOUNTER — Telehealth (INDEPENDENT_AMBULATORY_CARE_PROVIDER_SITE_OTHER): Payer: Self-pay

## 2012-08-04 NOTE — Telephone Encounter (Signed)
[facility]  The pt has seen Dr Derrell Lolling in the past for breast cancer.  She now has an over active parathyroid and needs surgery.  The pt is requesting to see Dr Gerrit Friends.  I told Vikki Ports we need to inform Dr Derrell Lolling to make sure he is ok with this.

## 2012-08-04 NOTE — Telephone Encounter (Signed)
Message received from pt stating she has not heard about an appointment at [facility] relating to parathyroid issue.  This RN reviewed, called [facility] and obtained an appointment with pt's breast surgeon, Dr Derrell Lolling for 3/4 at 830am for 9am appointment.  Called and informed pt- she states she was wanting to have surgical consult with Dr Silvano Rusk for thyroid issue per recommendations she has received.  This RN returned call to [facility] and spoke with Huntley Dec in the nursing office. Discussed the above. She states she will have MD review per courtesy and appointment scheduled.  Pt made aware of the above.

## 2012-08-04 NOTE — Progress Notes (Signed)
Called and left message for Shannon Nielsen at [facility] office to advise that Dr. Derrell Lolling has no problem with the patient seeing Dr. Gerrit Friends regarding her overactive parathyroid. Patient requested to be seen by Dr. Gerrit Friends regarding this.

## 2012-08-06 ENCOUNTER — Telehealth: Payer: Self-pay | Admitting: *Deleted

## 2012-08-06 NOTE — Telephone Encounter (Signed)
Appointment made for  with Dr Silvano Rusk for parathyroid surgical consult on 08/12/2012 at 10am for 1030 appt.  Pt made aware of above.

## 2012-08-11 ENCOUNTER — Encounter (INDEPENDENT_AMBULATORY_CARE_PROVIDER_SITE_OTHER): Payer: 59 | Admitting: General Surgery

## 2012-08-12 ENCOUNTER — Encounter (INDEPENDENT_AMBULATORY_CARE_PROVIDER_SITE_OTHER): Payer: Self-pay | Admitting: Surgery

## 2012-08-12 ENCOUNTER — Ambulatory Visit (INDEPENDENT_AMBULATORY_CARE_PROVIDER_SITE_OTHER): Payer: 59 | Admitting: Surgery

## 2012-08-12 VITALS — BP 128/84 | HR 72 | Temp 97.2°F | Resp 12 | Ht 67.0 in | Wt 147.0 lb

## 2012-08-12 DIAGNOSIS — E210 Primary hyperparathyroidism: Secondary | ICD-10-CM

## 2012-08-12 HISTORY — DX: Primary hyperparathyroidism: E21.0

## 2012-08-12 NOTE — Progress Notes (Signed)
[facility]  Chief Complaint  Patient presents with  . New Evaluation    eval parathyroid - referral from Dr. Marikay Alar [facility]rin[facility]    HISTORY: Patient is a 67 year old black female referred by her medical oncologist for suspected recurrent primary hyperparathyroidism. Patient has a history of previous parathyroidectomy by Dr. Mosetta Anis approximately 15-20 years ago. This appears to have been a minimally invasive procedure, likely a right inferior parathyroid gland. We will try to obtain records from the hospital of this procedure.  Patient was noted on routine laboratory studies to have hypercalcemia. She underwent laboratory studies in February 2014 which show an elevated serum calcium level of 10.8 and an elevated intact PTH level of 75.4. No additional diagnostic studies have been performed at this time.  Patient has had no other head or neck surgery. She denies any complications of hypercalcemia. There is no history of nephrolithiasis. She does not have a history of osteoporosis. She does take vitamin D supplements. She denies chronic fatigue.  Past Medical History  Diagnosis Date  . Hypertension   . Chest tightness   . Palpitations   . Hyperlipidemia      Current Outpatient Prescriptions  Medication Sig Dispense Refill  . aspirin 81 MG tablet Take 81 mg by mouth daily.        . bisoprolol-hydrochlorothiazide (ZIAC) 5-6.25 MG per tablet Take 1 tablet by mouth daily.        . Cholecalciferol (VITAMIN D-3) 5000 UNITS TABS Take 1 tablet by mouth daily.        . fluticasone (FLONASE) 50 MCG/ACT nasal spray 2 sprays by Nasal route daily.        Marland Kitchen gabapentin (NEURONTIN) 300 MG capsule Take 1 capsule (300 mg total) by mouth at bedtime as needed (postsurgical pain and/or hot flashes).  30 capsule  3  . latanoprost (XALATAN) 0.005 % ophthalmic solution       . letrozole (FEMARA) 2.5 MG tablet Take 1 tablet (2.5 mg total) by mouth daily.  30 tablet  12  .  omeprazole (PRILOSEC) 20 MG capsule Take 1 capsule (20 mg total) by mouth 2 (two) times daily.  60 capsule  3  . quinapril (ACCUPRIL) 20 MG tablet Take 10 mg by mouth at bedtime.        . rosuvastatin (CRESTOR) 20 MG tablet Take 10 mg by mouth daily.        Marland Kitchen tetrahydrozoline (EYE DROPS) 0.05 % ophthalmic solution Place 1 drop into both eyes daily.       No current facility-administered medications for this visit.     Allergies  Allergen Reactions  . Sulfa Antibiotics     Patient does not remember the type of reaction, happened years ago.  Marland Kitchen Penicillins Rash     Family History  Problem Relation Age of Onset  . Diabetes Mother   . Cancer Mother     breast     History   Social History  . Marital Status: Married    Spouse Name: N/A    Number of Children: N/A  . Years of Education: N/A   Social History Main Topics  . Smoking status: Never Smoker   . Smokeless tobacco: Never Used  . Alcohol Use: No  . Drug Use: No  . Sexually Active: None   Other Topics Concern  . None   Social History Narrative  . None     REVIEW OF SYSTEMS - PERTINENT POSITIVES ONLY: Denies fatigue. Denies  tremor. Denies palpitations. Denies nephrolithiasis. Denies osteopenia.  EXAM: Filed Vitals:   08/12/12 1034  BP: 128/84  Pulse: 72  Temp: 97.2 F (36.2 C)  Resp: 12    HEENT: normocephalic; pupils equal and reactive; sclerae clear; dentition good; mucous membranes moist NECK:  Well-healed surgical incision right lower neck consistent with previous parathyroidectomy; no palpable masses in the thyroid bed; symmetric on extension; no palpable anterior or posterior cervical lymphadenopathy; no supraclavicular masses; no tenderness CHEST: clear to auscultation bilaterally without rales, rhonchi, or wheezes CARDIAC: regular rate and rhythm without significant murmur; peripheral pulses are full EXT:  non-tender without edema; no deformity NEURO: no gross focal deficits; no sign of  tremor   LABORATORY RESULTS: See [facility] ([facility]) for most recent results   RADIOLOGY RESULTS: See [facility] ([facility]) for most recent results   IMPRESSION: #1 probable recurrent primary hyperparathyroidism #2 personal history of breast cancer  PLAN: I discussed the above findings at length with the patient. I provided her with written literature to review. We will obtain a nuclear medicine parathyroid scan and a 24-hour urine collection for calcium. Once those results are available I will contact the patient. If the nuclear scan localizes a parathyroid adenoma, then I believe she would be an excellent candidate for minimally invasive surgery. The incidence of second gland adenoma is approximately 4%. Patient understands and agrees to proceed.  Velora Heckler, MD, FACS General & Endocrine Surgery [facility]   Visit Diagnoses: 1. Hyperparathyroidism, primary     Primary Care Physician: Nadean Corwin, MD

## 2012-08-12 NOTE — Patient Instructions (Signed)

## 2012-08-19 ENCOUNTER — Encounter (HOSPITAL_COMMUNITY)
Admission: RE | Admit: 2012-08-19 | Discharge: 2012-08-19 | Disposition: A | Discharge status: Home / Self Care | Payer: 59 | Source: Ambulatory Visit | Attending: Surgery | Admitting: Surgery

## 2012-08-19 DIAGNOSIS — E210 Primary hyperparathyroidism: Secondary | ICD-10-CM

## 2012-08-19 MED ORDER — TECHNETIUM TC 99M SESTAMIBI - CARDIOLITE
24.9000 | Freq: Once | INTRAVENOUS | Status: AC | PRN
  Administered 2012-08-19: 09:00:00 24.9 via INTRAVENOUS

## 2012-08-25 ENCOUNTER — Telehealth (INDEPENDENT_AMBULATORY_CARE_PROVIDER_SITE_OTHER): Payer: Self-pay

## 2012-08-25 NOTE — Telephone Encounter (Signed)
Labs here. 24hr urine calcium = 82.6 and urine calcium = 5.9. Scan result in epic. Request sent to  Dr Gerrit Friends to review test results and advise pt f/u.

## 2012-08-31 ENCOUNTER — Telehealth (INDEPENDENT_AMBULATORY_CARE_PROVIDER_SITE_OTHER): Payer: Self-pay

## 2012-08-31 NOTE — Telephone Encounter (Signed)
Pt advised her results are with Dr Gerrit Friends and am awaiting him to review. I have advised him pt has called.

## 2012-09-02 ENCOUNTER — Other Ambulatory Visit: Payer: Self-pay | Admitting: Obstetrics and Gynecology

## 2012-09-02 ENCOUNTER — Telehealth (INDEPENDENT_AMBULATORY_CARE_PROVIDER_SITE_OTHER): Payer: Self-pay | Admitting: General Surgery

## 2012-09-02 NOTE — Telephone Encounter (Signed)
Patient calling to check on her thyroid scan and lab work.  Was instructed that Dr. Gerrit Friends would call her personally to discuss this matter.  Explained that I will deliver this message.

## 2012-09-03 ENCOUNTER — Other Ambulatory Visit (INDEPENDENT_AMBULATORY_CARE_PROVIDER_SITE_OTHER): Payer: Self-pay

## 2012-09-03 ENCOUNTER — Telehealth (INDEPENDENT_AMBULATORY_CARE_PROVIDER_SITE_OTHER): Payer: Self-pay

## 2012-09-03 ENCOUNTER — Telehealth (INDEPENDENT_AMBULATORY_CARE_PROVIDER_SITE_OTHER): Payer: Self-pay | Admitting: Surgery

## 2012-09-03 DIAGNOSIS — E213 Hyperparathyroidism, unspecified: Secondary | ICD-10-CM

## 2012-09-03 DIAGNOSIS — E210 Primary hyperparathyroidism: Secondary | ICD-10-CM

## 2012-09-03 NOTE — Telephone Encounter (Signed)
Called patient with negative sestamibi results.  Found old operative report from 2001 by Dr. Orpah Greek.  He did remove the right inferior gland.  I have recommended an neck ultrasound in hopes of identifying a second gland adenoma.  Failing this, she may need an MRI scan with contrast, or a selective venous sampling study.  I discussed this with the patient.  Velora Heckler, MD, [facility] Office: [phone]

## 2012-09-03 NOTE — Telephone Encounter (Signed)
Per Dr Ardine Eng request order put in epic for neck u/s to locate parathyroid adenoma. Pt can call [phone] to set up u/s. I have lmom on  both #s in pt record.

## 2012-09-09 ENCOUNTER — Encounter (INDEPENDENT_AMBULATORY_CARE_PROVIDER_SITE_OTHER): Payer: Self-pay

## 2012-09-10 ENCOUNTER — Ambulatory Visit
Admission: RE | Admit: 2012-09-10 | Discharge: 2012-09-10 | Disposition: A | Discharge status: Home / Self Care | Payer: 59 | Source: Ambulatory Visit | Attending: Surgery | Admitting: Surgery

## 2012-09-10 DIAGNOSIS — E213 Hyperparathyroidism, unspecified: Secondary | ICD-10-CM

## 2012-09-11 ENCOUNTER — Telehealth (INDEPENDENT_AMBULATORY_CARE_PROVIDER_SITE_OTHER): Payer: Self-pay

## 2012-09-11 NOTE — Telephone Encounter (Signed)
U/S result to Dr Gerrit Friends to review and advise what treatment plan he advises.

## 2012-09-14 ENCOUNTER — Other Ambulatory Visit (INDEPENDENT_AMBULATORY_CARE_PROVIDER_SITE_OTHER): Payer: Self-pay

## 2012-09-14 ENCOUNTER — Telehealth (INDEPENDENT_AMBULATORY_CARE_PROVIDER_SITE_OTHER): Payer: Self-pay | Admitting: Surgery

## 2012-09-14 ENCOUNTER — Telehealth (INDEPENDENT_AMBULATORY_CARE_PROVIDER_SITE_OTHER): Payer: Self-pay

## 2012-09-14 DIAGNOSIS — E042 Nontoxic multinodular goiter: Secondary | ICD-10-CM

## 2012-09-14 DIAGNOSIS — D440 Neoplasm of uncertain behavior of thyroid gland: Secondary | ICD-10-CM

## 2012-09-14 NOTE — Telephone Encounter (Signed)
Patient is contacted today by telephone. Her ultrasound examination from last week shows a small multinodular thyroid goiter. However the dominant nodules on each side of the thyroid measure 1.5 cm and contain worrisome microcalcifications. Radiology has recommended bilateral fine-needle aspiration biopsies. I have discussed this with the patient. We will contact radiology and arrange for bilateral fine-needle aspiration biopsy. I will contact her with those results.  Patient understands and agrees to proceed with further evaluation.  Velora Heckler, MD, [facility] Office: [phone]

## 2012-09-14 NOTE — Telephone Encounter (Signed)
Tammy at [facility] notified of orders in epic for bil thyroid bx. She states she did receive and will call pt to set up.

## 2012-09-15 ENCOUNTER — Other Ambulatory Visit (HOSPITAL_COMMUNITY)
Admission: RE | Admit: 2012-09-15 | Discharge: 2012-09-15 | Disposition: A | Discharge status: Home / Self Care | Payer: 59 | Source: Ambulatory Visit | Attending: Interventional Radiology | Admitting: Interventional Radiology

## 2012-09-15 ENCOUNTER — Ambulatory Visit
Admission: RE | Admit: 2012-09-15 | Discharge: 2012-09-15 | Disposition: A | Discharge status: Home / Self Care | Payer: 59 | Source: Ambulatory Visit | Attending: Surgery | Admitting: Surgery

## 2012-09-15 DIAGNOSIS — E041 Nontoxic single thyroid nodule: Secondary | ICD-10-CM | POA: Insufficient documentation

## 2012-09-15 DIAGNOSIS — E042 Nontoxic multinodular goiter: Secondary | ICD-10-CM

## 2012-09-18 ENCOUNTER — Telehealth (INDEPENDENT_AMBULATORY_CARE_PROVIDER_SITE_OTHER): Payer: Self-pay

## 2012-09-18 NOTE — Telephone Encounter (Signed)
Pt notified of path results. Pt advised I will send path to Dr Gerrit Friends to review what f/u is needed and call her with response.

## 2012-09-22 ENCOUNTER — Telehealth (INDEPENDENT_AMBULATORY_CARE_PROVIDER_SITE_OTHER): Payer: Self-pay | Admitting: Surgery

## 2012-09-22 DIAGNOSIS — E210 Primary hyperparathyroidism: Secondary | ICD-10-CM

## 2012-09-22 DIAGNOSIS — D440 Neoplasm of uncertain behavior of thyroid gland: Secondary | ICD-10-CM

## 2012-09-22 NOTE — Telephone Encounter (Signed)
Called patient with results of bilateral thyroid nodule biopsies.  Both nodules have benign cytopathology.  Will need follow up thyroid ultrasound in one year.  Work-up for primary hyperparathyroidism has been unrevealing.  Sestamibi scan is negative for adenoma.  USN does not show parathyroid adenoma.  Recent PTH level remains elevated at 92.  I have recommended an MRI scan of the neck and chest with and without contrast to evaluate for second gland adenoma.  We will schedule this study with [facility] in the near future.  Velora Heckler, MD, [facility] Office: [phone]

## 2012-09-23 ENCOUNTER — Other Ambulatory Visit (INDEPENDENT_AMBULATORY_CARE_PROVIDER_SITE_OTHER): Payer: Self-pay

## 2012-09-23 ENCOUNTER — Telehealth (INDEPENDENT_AMBULATORY_CARE_PROVIDER_SITE_OTHER): Payer: Self-pay

## 2012-09-23 DIAGNOSIS — E210 Primary hyperparathyroidism: Secondary | ICD-10-CM

## 2012-09-23 NOTE — Telephone Encounter (Signed)
Order place in epic and given to Susie to sched and contact pt.

## 2012-09-24 ENCOUNTER — Telehealth (INDEPENDENT_AMBULATORY_CARE_PROVIDER_SITE_OTHER): Payer: Self-pay | Admitting: General Surgery

## 2012-09-24 NOTE — Telephone Encounter (Signed)
Notification number was obtained through [facility]  Or MRI face orbit neck this was given to [facility] Dr Sid Falcon nurse

## 2012-09-29 ENCOUNTER — Ambulatory Visit (HOSPITAL_COMMUNITY)
Admission: RE | Admit: 2012-09-29 | Discharge: 2012-09-29 | Disposition: A | Discharge status: Home / Self Care | Payer: 59 | Source: Ambulatory Visit | Attending: Surgery | Admitting: Surgery

## 2012-09-29 DIAGNOSIS — E049 Nontoxic goiter, unspecified: Secondary | ICD-10-CM | POA: Insufficient documentation

## 2012-09-29 DIAGNOSIS — E210 Primary hyperparathyroidism: Secondary | ICD-10-CM | POA: Insufficient documentation

## 2012-09-29 LAB — CREATININE, SERUM
Creatinine, Ser: 1.1 mg/dL (ref 0.50–1.10)
GFR calc Af Amer: 60 mL/min — ABNORMAL LOW (ref >90)
GFR calc non Af Amer: 52 mL/min — ABNORMAL LOW (ref >90)

## 2012-09-29 MED ORDER — GADOBENATE DIMEGLUMINE 529 MG/ML IV SOLN
13.0000 mL | Freq: Once | INTRAVENOUS | Status: AC
  Administered 2012-09-29: 13 mL via INTRAVENOUS

## 2012-10-02 ENCOUNTER — Ambulatory Visit (INDEPENDENT_AMBULATORY_CARE_PROVIDER_SITE_OTHER): Payer: 59 | Admitting: General Surgery

## 2012-10-06 ENCOUNTER — Telehealth (INDEPENDENT_AMBULATORY_CARE_PROVIDER_SITE_OTHER): Payer: Self-pay

## 2012-10-06 NOTE — Telephone Encounter (Signed)
Patient called in for MR results.  Results given to patient (No lesion suspicious for parathyroid adenoma identified. Thyromegaly in the lower right  thyroid more than likely related to recent ultrasound biopsy)  Patient would like a call back to discuss her treatment plan.

## 2012-10-07 NOTE — Telephone Encounter (Signed)
Message sent to Dr Gerrit Friends for review.

## 2012-10-08 NOTE — Telephone Encounter (Signed)
Pt called again today to speak to Dr. Gerrit Friends regarding her results.  I read them to her again, and explained that Dr. Gerrit Friends had not been in the office today.  When he returns he will review the MR and call her.

## 2012-10-20 ENCOUNTER — Telehealth (INDEPENDENT_AMBULATORY_CARE_PROVIDER_SITE_OTHER): Payer: Self-pay | Admitting: Surgery

## 2012-10-20 DIAGNOSIS — E210 Primary hyperparathyroidism: Secondary | ICD-10-CM

## 2012-10-20 NOTE — Telephone Encounter (Signed)
Called patient to review MRI scan of neck.  No evidence of parathyroid adenoma.  Discussed further steps.  Have three options: 1.  Operate and perform neck exploration in hopes of identifying adenoma.  This is not my first choice.  Increased risk from prior surgery.  Unsuccessful localization thus far - possible ectopic gland. 2.  Continue work up with selective venous sampling - more invasive testing, not as precise for localization. 3.  Seek a second opinion prior to more invasive testing - would refer to university physician for review.  Patient would like to discuss these options with Dr. Oneta Rack.  Will await further instructions from patient and her physicians.  Velora Heckler, MD, [facility] Office: [phone]

## 2012-11-26 ENCOUNTER — Ambulatory Visit (INDEPENDENT_AMBULATORY_CARE_PROVIDER_SITE_OTHER): Payer: 59 | Admitting: General Surgery

## 2012-12-28 ENCOUNTER — Encounter (INDEPENDENT_AMBULATORY_CARE_PROVIDER_SITE_OTHER): Payer: Self-pay

## 2013-01-07 ENCOUNTER — Other Ambulatory Visit (HOSPITAL_COMMUNITY): Payer: Self-pay | Admitting: Internal Medicine

## 2013-01-07 DIAGNOSIS — M8580 Other specified disorders of bone density and structure, unspecified site: Secondary | ICD-10-CM

## 2013-01-13 ENCOUNTER — Ambulatory Visit (HOSPITAL_COMMUNITY)
Admission: RE | Admit: 2013-01-13 | Discharge: 2013-01-13 | Disposition: A | Discharge status: Home / Self Care | Payer: 59 | Source: Ambulatory Visit | Attending: Internal Medicine | Admitting: Internal Medicine

## 2013-01-13 ENCOUNTER — Other Ambulatory Visit (HOSPITAL_COMMUNITY): Payer: Self-pay | Admitting: Internal Medicine

## 2013-01-13 DIAGNOSIS — M8580 Other specified disorders of bone density and structure, unspecified site: Secondary | ICD-10-CM

## 2013-01-13 DIAGNOSIS — Z13820 Encounter for screening for osteoporosis: Secondary | ICD-10-CM | POA: Insufficient documentation

## 2013-01-13 DIAGNOSIS — Z780 Asymptomatic menopausal state: Secondary | ICD-10-CM | POA: Insufficient documentation

## 2013-01-22 ENCOUNTER — Ambulatory Visit (INDEPENDENT_AMBULATORY_CARE_PROVIDER_SITE_OTHER): Payer: 59 | Admitting: General Surgery

## 2013-01-22 ENCOUNTER — Encounter (INDEPENDENT_AMBULATORY_CARE_PROVIDER_SITE_OTHER): Payer: Self-pay | Admitting: General Surgery

## 2013-01-22 VITALS — BP 120/78 | HR 68 | Resp 14 | Ht 66.0 in | Wt 145.6 lb

## 2013-01-22 DIAGNOSIS — C50919 Malignant neoplasm of unspecified site of unspecified female breast: Secondary | ICD-10-CM

## 2013-01-22 DIAGNOSIS — C50912 Malignant neoplasm of unspecified site of left female breast: Secondary | ICD-10-CM

## 2013-01-22 NOTE — Patient Instructions (Signed)
Examination of your mastectomy site and skin, and all lymph nodes is normal. There is no evidence of cancer.  Continue to take Femara  Keep your regular appointments with Dr. Darnelle Catalan  Return to see Dr. Derrell Lolling in one year.

## 2013-01-22 NOTE — Progress Notes (Addendum)
Patient ID: Shannon Nielsen, female   DOB: 05/08/1946, 67 y.o.   MRN: [id number] History:   Shannon Nielsen is a 67 y.o. female. She returns for long-term followup regarding her left breast cancer.  This patient was diagnosed with cancer of the left breast in March of 2011. A Port-A-Cath was placed and she underwent neoadjuvant chemotherapy for a receptor positive, HER-2-negative, Ki-67 56% tumor.  On January 31, 2010 she underwent bilateral total mastectomy, left axillary lymph node dissection and removal of Port-A-Cath.  In her left breast she had a complete pathologic response, ypT0, ypN0. BRCA1 and BRCA2 were negative. In her right breast, which was prophylactic, was only benign pathology  She had adjuvant radiation therapy to the left chest wall. She continues to take Femara. She continues to followup with Dr. Darnelle Catalan, and plans to see him next week.  She continues to follow with Dr. Lucky Cowboy because of hypertension, hyperlipidemia, glaucoma, and osteopenia. She's generally healthy..  Exam: Patient alert and pleasant. Looks very fit for her age. No distress. Neck: No adenopathy or mass Lungs: Clear to auscultation bilaterally Heart: Regular rate and rhythm, no murmur, no ectopy Breasts: Bilateral total mastectomy scars are well-healed. No nodules or ulceration. No pathologic skin Change or adenopathy. The skin is actually soft and healthy. No arm swelling  Assessment:  Invasive ductal carcinoma left breast, pretreatment clinical stage T3, N1, upper inner quadrant left breast, receptor positive, HER-2-negative, Ki-67 56%  Complete pathologic response following neoadjuvant chemotherapy  No evidence of recurrence 18 months following bilateral mastectomies and left axillary lymph node dissection and adjuvant radiation therapy to left chest wall.  BRCA1 and BRCA2 negative  Plan: Return to see me in one year Continue Femara Keep appt. With Dr. Darnelle Catalan. I told her that if Dr. Darnelle Catalan was  willing, we can simply alternate seeing her every 6 months for the next 2 years   Shannon Nielsen M. Derrell Lolling, M.D., [facility] General and Minimally invasive Surgery Breast and Colorectal Surgery Office:   [phone] Pager:   [phone]

## 2013-01-26 ENCOUNTER — Ambulatory Visit (HOSPITAL_BASED_OUTPATIENT_CLINIC_OR_DEPARTMENT_OTHER): Payer: 59 | Admitting: Oncology

## 2013-01-26 ENCOUNTER — Other Ambulatory Visit (HOSPITAL_BASED_OUTPATIENT_CLINIC_OR_DEPARTMENT_OTHER): Payer: 59 | Admitting: Lab

## 2013-01-26 ENCOUNTER — Telehealth: Payer: Self-pay | Admitting: Oncology

## 2013-01-26 VITALS — BP 156/95 | HR 80 | Temp 97.7°F | Resp 19 | Ht 66.0 in | Wt 145.7 lb

## 2013-01-26 DIAGNOSIS — C50919 Malignant neoplasm of unspecified site of unspecified female breast: Secondary | ICD-10-CM

## 2013-01-26 DIAGNOSIS — C50419 Malignant neoplasm of upper-outer quadrant of unspecified female breast: Secondary | ICD-10-CM

## 2013-01-26 DIAGNOSIS — Z170 Estrogen receptor positive status [ER+]: Secondary | ICD-10-CM

## 2013-01-26 DIAGNOSIS — C773 Secondary and unspecified malignant neoplasm of axilla and upper limb lymph nodes: Secondary | ICD-10-CM

## 2013-01-26 DIAGNOSIS — Z9010 Acquired absence of unspecified breast and nipple: Secondary | ICD-10-CM

## 2013-01-26 LAB — COMPREHENSIVE METABOLIC PANEL (CC13)
ALT: 15 U/L (ref 0–55)
AST: 22 U/L (ref 5–34)
Calcium: 10.2 mg/dL (ref 8.4–10.4)
Chloride: 106 mEq/L (ref 98–109)
Creatinine: 1 mg/dL (ref 0.6–1.1)
Total Bilirubin: 0.75 mg/dL (ref 0.20–1.20)

## 2013-01-26 LAB — CBC WITH DIFFERENTIAL/PLATELET
BASO%: 0.4 % (ref 0.0–2.0)
EOS%: 0.5 % (ref 0.0–7.0)
HCT: 40.9 % (ref 34.8–46.6)
MCH: 30.9 pg (ref 25.1–34.0)
MCHC: 33.4 g/dL (ref 31.5–36.0)
NEUT%: 56 % (ref 38.4–76.8)
RBC: 4.43 10*6/uL (ref 3.70–5.45)
lymph#: 1.4 10*3/uL (ref 0.9–3.3)

## 2013-01-26 NOTE — Telephone Encounter (Signed)
, °

## 2013-01-26 NOTE — Progress Notes (Signed)
ID: Shannon Nielsen   DOB: 05-Feb-1946  MR#: [id number]  WUJ#:[id number]  PCP: Nadean Corwin, MD GYN: SUClaud Kelp OTHER MD: Kathrin Penner   HISTORY OF PRESENT ILLNESS: Shannon Nielsen had screening mammography October 03, 2008 which was unremarkable.  Towards the end of that year or early the next year, she noticed a little bit of discomfort and itching in both breasts, particularly around the nipple.  She brought this to Dr. Kathryne Sharper attention and was referred for a diagnostic mammography on August 28, 2009.  On exam, Dr. Judyann Munson found diffuse firmness of the entire left breast with skin thickening.  By mammography, the left breast showed increased trabecular thickening and an ill-defined mass with developing calcifications in the upper outer quadrant.  The ultrasound on the same day showed in the left breast an irregular hypoechoic mass measuring 2.0 cm.  In the axilla on the left side there was at least one enlarged lymph node which measured 2 cm.  Biopsy was performed the same day of both the breast and the axilla and this showed ([id number]), an invasive ductal carcinoma, high grade.  The lymph node was also involved.  The tumor was estrogen receptor positive at 84%, progesterone receptor positive at 71% with an elevated MIB-1 of 56%.  There was no amplification of HER2/neu with a ratio of 1.32 by C[facility].  Bilateral breast MRIs were obtained on March 24.  This showed a symmetric edema throughout the left breast with skin thickening, most prominently anteriorly near the nipple.  Otherwise non-mass like enhancement involving the majority of the left breast including all four quadrants, in the upper inner left breast, there was an ill defined more mass like area which measured up to 2.5 cm, corresponding to the abnormality seen by ultrasound.  In total the abnormal area of enhancement measuring 7.3 cm in largest dimension.  There was also abnormal enhancement of the thickened skin in the  anterior left breast.  The pectoralis muscle did not appear to be involved.  On the right side there were some cysts and in the axilla there was one enlarged left axillary lymph node.  In addition, an incidental finding in the right lobe of the liver was a T2 bright lesion measuring approximately 1 cm.  Her subsequent history is as detailed below. . INTERVAL HISTORY: Shannon Nielsen returns for followup of her breast cancer. Interval history is generally unremarkable. Work and family are both doing "fine". She is not exercising regularly "but that's okay to".  REVIEW OF SYSTEMS: Shannon Nielsen  denies any unusual headaches, fevers, rash, bleeding, unexplained fatigue or unexplained weight loss. She has a urinary tract infection currently which is being treated by her primary care physician. She has followup with Dr. Derrell Lolling and Mrs. this month and yearly) Dr. Arizona Constable for her parathyroid and thyroid issues, and she is scheduled for colonoscopy in the near future under Dr. May.. A detailed review of systems today was noncontributory except as noted.   PAST MEDICAL HISTORY: Past Medical History  Diagnosis Date  . Hypertension   . Chest tightness   . Palpitations   . Hyperlipidemia   history of chronic sinusitis, history of hemorrhoids, history of bilateral tubal ligation,  history of prior multiple breast biopsies.  PAST SURGICAL HISTORY: Past Surgical History  Procedure Laterality Date  . Mastectomy  August 2011    Bilateral  . Parathyroidectomy    . Nasal sinus surgery      FAMILY HISTORY Family History  Problem Relation Age  of Onset  . Diabetes Mother   . Cancer Mother     breast  The patient's father died at the age of 27 from complications of atherosclerotic coronary vascular disease.  The patient's mother is alive at 57 and the patient is engaged on a daily basis in her care, although the patient's mother does have a four-hour nurse who comes daily, and a Press photographer who stays with her at [employer].   The patient's mother had breast cancer diagnosed at age 66.  The patient has no sisters.  She has two brothers, one of them has sickle cell trait, the other one has a history of prostate cancer.  The only breast cancers in the family that she is aware of are two cousins on the father's side, one of whom may have been diagnosed before the age of 43.  There was also a maternal aunt who may have been diagnosed with ovarian cancer at the age of 78, but this is not clear.  GYNECOLOGIC HISTORY: She is Gx, P1, first pregnancy to term age 79.  She went through the change of life at 69.  She did not take hormone replacement therapy.  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  Her husband of >20 years, Shannon Nielsen, retired from [employer] and currently works as a Primary school teacher (commutes).  I should add that I saw Shannon Nielsen remotely for what proved to be a history of glomerulonephritis.  Shannon Nielsen's first wife was my patient, Shannon Nielsen, and Shannon Nielsen, Shannon Nielsen, is Shannon Nielsen's stepdaughter.  Shannon Nielsen works as an Astronomer. at [employer]  Shannon Nielsen;y's biological son is Shannon Nielsen; he works in Community education officer and part time as a Leisure centre manager.  Shannon Nielsen has two biological and three step-grandchildren.  She is a International aid/development worker   ADVANCED DIRECTIVES:  HEALTH MAINTENANCE: History  Substance Use Topics  . Smoking status: Never Smoker   . Smokeless tobacco: Never Used  . Alcohol Use: No     Colonoscopy:  PAP:  Bone density: Scheduled for March 2014  Lipid panel:  UTD, Dr. Oneta Rack  Allergies  Allergen Reactions  . Sulfa Antibiotics     Patient does not remember the type of reaction, happened years ago.  Marland Kitchen Penicillins Rash    Current Outpatient Prescriptions  Medication Sig Dispense Refill  . aspirin 81 MG tablet Take 81 mg by mouth daily.        . bisoprolol-hydrochlorothiazide (ZIAC) 5-6.25 MG per tablet Take 1 tablet by mouth daily.        . Cholecalciferol (VITAMIN D-3) 5000 UNITS TABS Take 1  tablet by mouth daily.        . fluticasone (FLONASE) 50 MCG/ACT nasal spray 2 sprays by Nasal route daily.        Marland Kitchen gabapentin (NEURONTIN) 300 MG capsule Take 1 capsule (300 mg total) by mouth at bedtime as needed (postsurgical pain and/or hot flashes).  30 capsule  3  . latanoprost (XALATAN) 0.005 % ophthalmic solution       . letrozole (FEMARA) 2.5 MG tablet Take 1 tablet (2.5 mg total) by mouth daily.  30 tablet  12  . omeprazole (PRILOSEC) 20 MG capsule Take 1 capsule (20 mg total) by mouth 2 (two) times daily.  60 capsule  3  . quinapril (ACCUPRIL) 20 MG tablet Take 10 mg by mouth at bedtime.        . rosuvastatin (CRESTOR) 20 MG tablet Take 10 mg by mouth daily.        Marland Kitchen  tetrahydrozoline (EYE DROPS) 0.05 % ophthalmic solution Place 1 drop into both eyes daily.       No current facility-administered medications for this visit.    OBJECTIVE: middle-aged African American woman in no acute distress Filed Vitals:   01/26/13 1542  BP: 156/95  Pulse: 80  Temp: 97.7 F (36.5 C)  Resp: 19     Body mass index is 23.53 kg/(m^2).    ECOG FS: 0 Filed Weights   01/26/13 1542  Weight: 145 lb 11.2 oz (66.089 kg)   Sclerae unicteric, pupils equal round and reactive to light Oropharynx clear No cervical or supraclavicular adenopathy Lungs clear to auscultation, no rales or rhonchi Heart regular rate and rhythm Abdomen soft, nontender, positive bowel sounds MSK no focal spinal tenderness, no peripheral edema Neuro: nonfocal, well oriented, pleasant affect Breasts: status post bilateral mastectomies. There is no evidence of local recurrence. Axillae are benign bilaterally    LAB RESULTS: Lab Results  Component Value Date   WBC 4.0 01/26/2013   NEUTROABS 2.2 01/26/2013   HGB 13.7 01/26/2013   HCT 40.9 01/26/2013   MCV 92.4 01/26/2013   PLT 168 01/26/2013      Chemistry      Component Value Date/Time   NA 141 07/28/2012 1437   NA 138 12/16/2011 1410   NA 141 07/01/2011 0918   K 4.1  07/28/2012 1437   K 4.0 12/16/2011 1410   K 4.3 07/01/2011 0918   CL 104 07/28/2012 1437   CL 100 12/16/2011 1410   CL 106 07/01/2011 0918   CO2 28 07/28/2012 1437   CO2 31 12/16/2011 1410   CO2 26 07/01/2011 0918   BUN 16.5 07/28/2012 1437   BUN 14 12/16/2011 1410   BUN 16 07/01/2011 0918   CREATININE 1.10 09/29/2012 1139   CREATININE 1.2* 07/28/2012 1437   CREATININE 1.1 12/16/2011 1410      Component Value Date/Time   CALCIUM 10.8* 07/28/2012 1618   CALCIUM 11.2* 07/28/2012 1437   CALCIUM 9.7 12/16/2011 1410   CALCIUM 10.5 07/01/2011 0918   ALKPHOS 138 07/28/2012 1437   ALKPHOS 117* 12/16/2011 1410   ALKPHOS 108 07/01/2011 0918   AST 20 07/28/2012 1437   AST 23 12/16/2011 1410   AST 23 07/01/2011 0918   ALT 14 07/28/2012 1437   ALT 18 12/16/2011 1410   ALT 12 07/01/2011 0918   BILITOT 0.82 07/28/2012 1437   BILITOT 1.10 12/16/2011 1410   BILITOT 0.7 07/01/2011 0918       Lab Results  Component Value Date   LABCA2 17 07/01/2011    STUDIES: Dg Bone Density Peripheral Skeleton  01/13/2013   The Bone Mineral Densitometry hard-copy report (which includes all data, graphical display, and FRAX results when applicable) has been sent directly to the ordering physician.  This report can also be obtained electronically by viewing images for this exam through the performing facility's EMR, or by logging directly into [employer].   Original Report Authenticated By: Ulyses Southward, M.D.   ASSESSMENT: 67 y.o. BRCA 1/2 negative [place] woman   (1) status post left breast biopsy in March 2011 for a high-grade invasive ductal carcinoma, with biopsy-proven axillary lymph node involvement at presentation. ER/PR positive, HER2 negative, with MIB-1 of 56%.   (2) received neoadjuvant chemotherapy with docetaxel, doxorubicin and cyclophosphamide x4, discontinued due to peripheral neuropathy.  (3) received 1 cycle of carboplatin and gemcitabine, also stopped due to worsening neuropathy.   (4) status post bilateral mastectomies  and left  axillary lymph node dissection in August 2011, the result showing a complete pathologic response in both the breast and the lymph nodes  (5) completed radiation therapy November 2011  (6) started letrozole November 2011; normal bone density 01/13/2013  (7) elevated PTH and calcium but no localizable adenoma, with history of remote parathyroidectomy  (8) bilateral solitary thyroid nodules, status post right thyroid nodule biopsy April 2014 consistent with benign goiter.   PLAN:  Shannon Nielsen is doing fine from a breast cancer point of view, and she is tolerating her letrozole well. I think she has a little bit "whitecoat hypertension", because her blood pressure is not elevated when she checks it outside of the doctor's office. Her calcium is being closely followed by Dr. Oneta Rack and at this point she is not motivated to go to [facility] for further evaluation of deep parathyroid adenoma she almost certainly has blood which we cannot locate.  She will be seeing Dr. Derrell Lolling a year from now. Accordingly she will start seeing [place] in February on a yearly basis until she completes her 5 years of letrozole. She knows to call for any problems that may develop before her next visit here. Shannon Nielsen C    01/26/2013

## 2013-01-27 NOTE — Addendum Note (Signed)
Addended by: Lorenza Evangelist A on: 01/27/2013 10:19 AM   Modules accepted: Orders

## 2013-02-01 ENCOUNTER — Telehealth (INDEPENDENT_AMBULATORY_CARE_PROVIDER_SITE_OTHER): Payer: Self-pay

## 2013-02-01 NOTE — Telephone Encounter (Signed)
Patient does not desire further surgical follow up at this time.  She will review current findings with her primary MD and oncologist.  Please let us know if we can assist with referral for second opinion if desired.  Velora Heckler, MD, [facility] Office: [phone]

## 2013-02-01 NOTE — Telephone Encounter (Signed)
Per Dr Ardine Eng request I contacted pt via phone to make f/u appt for pt to discuss continued elevation of her PTH and calcium. I gave pt what normal levels are and reviewed her levels with her. Pt states she will call back. Pt declines to make appt at this time. Pt states Dr Darnelle Catalan did not seem concerned when he saw her on 01-26-13. I explained the importance of follow up but pt again declined. Will await pt to call back for appt.

## 2013-03-08 ENCOUNTER — Encounter (INDEPENDENT_AMBULATORY_CARE_PROVIDER_SITE_OTHER): Payer: Self-pay

## 2013-03-29 ENCOUNTER — Other Ambulatory Visit: Payer: Self-pay | Admitting: Gastroenterology

## 2013-04-23 ENCOUNTER — Encounter (INDEPENDENT_AMBULATORY_CARE_PROVIDER_SITE_OTHER): Payer: Self-pay

## 2013-05-26 ENCOUNTER — Encounter: Payer: Self-pay | Admitting: Internal Medicine

## 2013-05-26 DIAGNOSIS — R7303 Prediabetes: Secondary | ICD-10-CM | POA: Insufficient documentation

## 2013-05-26 DIAGNOSIS — K219 Gastro-esophageal reflux disease without esophagitis: Secondary | ICD-10-CM | POA: Insufficient documentation

## 2013-05-26 DIAGNOSIS — E559 Vitamin D deficiency, unspecified: Secondary | ICD-10-CM | POA: Insufficient documentation

## 2013-05-27 ENCOUNTER — Ambulatory Visit (INDEPENDENT_AMBULATORY_CARE_PROVIDER_SITE_OTHER): Payer: 59 | Admitting: Physician Assistant

## 2013-05-27 ENCOUNTER — Encounter: Payer: Self-pay | Admitting: Physician Assistant

## 2013-05-27 VITALS — BP 118/72 | HR 72 | Temp 98.1°F | Resp 16 | Ht 66.0 in | Wt 146.0 lb

## 2013-05-27 DIAGNOSIS — R7303 Prediabetes: Secondary | ICD-10-CM

## 2013-05-27 DIAGNOSIS — E559 Vitamin D deficiency, unspecified: Secondary | ICD-10-CM

## 2013-05-27 DIAGNOSIS — I10 Essential (primary) hypertension: Secondary | ICD-10-CM

## 2013-05-27 DIAGNOSIS — E785 Hyperlipidemia, unspecified: Secondary | ICD-10-CM

## 2013-05-27 DIAGNOSIS — Z79899 Other long term (current) drug therapy: Secondary | ICD-10-CM

## 2013-05-27 LAB — CBC WITH DIFFERENTIAL/PLATELET
Eosinophils Absolute: 0 10*3/uL (ref 0.0–0.7)
Eosinophils Relative: 1 % (ref 0–5)
Hemoglobin: 14.5 g/dL (ref 12.0–15.0)
Lymphs Abs: 1.8 10*3/uL (ref 0.7–4.0)
MCH: 30.5 pg (ref 26.0–34.0)
MCV: 87.4 fL (ref 78.0–100.0)
Monocytes Absolute: 0.4 10*3/uL (ref 0.1–1.0)
Monocytes Relative: 10 % (ref 3–12)
RBC: 4.75 MIL/uL (ref 3.87–5.11)

## 2013-05-27 NOTE — Patient Instructions (Signed)
Prednisone, please take with food, it can irritate your stomach- try the this, do the taper however if you get more chest pain, burping, bad taste in mouth, or reflux please get on Prilosec(ompeprazole)  Back Exercises Back exercises help treat and prevent back injuries. The goal of back exercises is to increase the strength of your abdominal and back muscles and the flexibility of your back. These exercises should be started when you no longer have back pain. Back exercises include:  Pelvic Tilt. Lie on your back with your knees bent. Tilt your pelvis until the lower part of your back is against the floor. Hold this position 5 to 10 sec and repeat 5 to 10 times.  Knee to Chest. Pull first 1 knee up against your chest and hold for 20 to 30 seconds, repeat this with the other knee, and then both knees. This may be done with the other leg straight or bent, whichever feels better.  Sit-Ups or Curl-Ups. Bend your knees 90 degrees. Start with tilting your pelvis, and do a partial, slow sit-up, lifting your trunk only 30 to 45 degrees off the floor. Take at least 2 to 3 seconds for each sit-up. Do not do sit-ups with your knees out straight. If partial sit-ups are difficult, simply do the above but with only tightening your abdominal muscles and holding it as directed.  Hip-Lift. Lie on your back with your knees flexed 90 degrees. Push down with your feet and shoulders as you raise your hips a couple inches off the floor; hold for 10 seconds, repeat 5 to 10 times.  Back arches. Lie on your stomach, propping yourself up on bent elbows. Slowly press on your hands, causing an arch in your low back. Repeat 3 to 5 times. Any initial stiffness and discomfort should lessen with repetition over time.  Shoulder-Lifts. Lie face down with arms beside your body. Keep hips and torso pressed to floor as you slowly lift your head and shoulders off the floor. Do not overdo your exercises, especially in the beginning.  Exercises may cause you some mild back discomfort which lasts for a few minutes; however, if the pain is more severe, or lasts for more than 15 minutes, do not continue exercises until you see your caregiver. Improvement with exercise therapy for back problems is slow.  See your caregivers for assistance with developing a proper back exercise program. Document Released: 07/04/2004 Document Revised: 08/19/2011 Document Reviewed: 03/28/2011 [facility] Patient Information 2014 [place], [place].

## 2013-05-27 NOTE — Progress Notes (Signed)
HPI Patient presents for 3 month follow up with hypertension, hyperlipidemia, prediabetes and vitamin D. Patient's blood pressure has been controlled at home, today their BP is BP: 118/72 mmHg  Patient denies chest pain, shortness of breath, dizziness.  Patient's cholesterol is diet controlled. In addition they are on Crestor and denies myalgias. The cholesterol last visit was LDL 77.  The patient has been working on diet and exercise for prediabetes, and denies changes in vision, polys, and paresthesias. A1C 6.2 Patient is on Vitamin D supplement.  Vitamin D 81 She was planning on getting ESRI with Dr.Ramos but is having second thoughts and would like to have prednisone pills instead.   Current Medications:  Current Outpatient Prescriptions on File Prior to Visit  Medication Sig Dispense Refill  . aspirin 81 MG tablet Take 81 mg by mouth daily.        . bisoprolol-hydrochlorothiazide (ZIAC) 5-6.25 MG per tablet Take 1 tablet by mouth daily.        . Cholecalciferol (VITAMIN D-3) 5000 UNITS TABS Take 1 tablet by mouth daily.        . fluticasone (FLONASE) 50 MCG/ACT nasal spray 2 sprays by Nasal route daily.        Marland Kitchen gabapentin (NEURONTIN) 300 MG capsule Take 1 capsule (300 mg total) by mouth at bedtime as needed (postsurgical pain and/or hot flashes).  30 capsule  3  . latanoprost (XALATAN) 0.005 % ophthalmic solution       . letrozole (FEMARA) 2.5 MG tablet Take 1 tablet (2.5 mg total) by mouth daily.  30 tablet  12  . nitrofurantoin (MACRODANTIN) 100 MG capsule Take 100 mg by mouth 2 (two) times daily.      . quinapril (ACCUPRIL) 20 MG tablet Take 10 mg by mouth at bedtime.        . rosuvastatin (CRESTOR) 20 MG tablet Take 10 mg by mouth daily.        Marland Kitchen tetrahydrozoline (EYE DROPS) 0.05 % ophthalmic solution Place 1 drop into both eyes daily.       No current facility-administered medications on file prior to visit.   Medical History:  Past Medical History  Diagnosis Date  .  Hypertension   . Chest tightness   . Palpitations   . Hyperlipidemia   . Prediabetes   . Vitamin D deficiency   . GERD (gastroesophageal reflux disease)   . Cancer     left 2011   Allergies:  Allergies  Allergen Reactions  . Minocycline   . Prednisone Swelling  . Sulfa Antibiotics     Patient does not remember the type of reaction, happened years ago.  . Zithromax [Azithromycin] Swelling  . Penicillins Rash    ROS Constitutional: Denies fever, chills, headaches, insomnia, fatigue, night sweats Eyes: Denies redness, blurred vision, diplopia, discharge, itchy, watery eyes.  ENT: Denies congestion, post nasal drip, sore throat, earache, dental pain, Tinnitus, Vertigo, Sinus pain, snoring.  Cardio: Denies chest pain, palpitations, irregular heartbeat, dyspnea, diaphoresis, orthopnea, PND, claudication, edema Respiratory: denies cough, shortness of breath, wheezing.  Gastrointestinal: Denies dysphagia, heartburn, AB pain/ cramps, N/V, diarrhea, constipation, hematemesis, melena, hematochezia,  hemorrhoids Genitourinary: Denies dysuria, frequency, urgency, nocturia, hesitancy, discharge, hematuria, flank pain Musculoskeletal: Denies myalgia, stiffness, pain, swelling and strain/sprain. Skin: Denies pruritis, rash, changing in skin lesion Neuro: Denies Weakness, tremor, incoordination, spasms, pain Psychiatric: Denies confusion, memory loss, sensory loss Endocrine: Denies change in weight, skin, hair change, nocturia Diabetic Polys, Denies visual blurring, hyper /hypo glycemic episodes, and paresthesia,  Heme/Lymph: Denies Excessive bleeding, bruising, enlarged lymph nodes  Family history- Review and unchanged Social history- Review and unchanged Physical Exam: Filed Vitals:   05/27/13 1638  BP: 118/72  Pulse: 72  Temp: 98.1 F (36.7 C)  Resp: 16   Filed Weights   05/27/13 1638  Weight: 146 lb (66.225 kg)   General Appearance: Well nourished, in no apparent distress. Eyes:  PERRLA, EOMs, conjunctiva no swelling or erythema Sinuses: No Frontal/maxillary tenderness ENT/Mouth: Ext aud canals clear, TMs without erythema, bulging. No erythema, swelling, or exudate on post pharynx.  Tonsils not swollen or erythematous. Hearing normal.  Neck: Supple, thyroid normal.  Respiratory: Respiratory effort normal, BS equal bilaterally without rales, rhonchi, wheezing or stridor.  Cardio: RRR with no MRGs. Brisk peripheral pulses without edema.  Abdomen: Soft, + BS.  Non tender, no guarding, rebound, hernias, masses. Lymphatics: Non tender without lymphadenopathy.  Musculoskeletal: Full ROM, 5/5 strength, normal gait.  Skin: Warm, dry without rashes, lesions, ecchymosis.  Neuro: Cranial nerves intact. Normal muscle tone, no cerebellar symptoms. Sensation intact.  Psych: Awake and oriented X 3, normal affect, Insight and Judgment appropriate.   Assessment and Plan:  Hypertension: Continue medication, monitor blood pressure at home.  Continue DASH diet. Cholesterol: Continue diet and exercise. Check cholesterol.  Pre-diabetes-Continue diet and exercise. Check A1C Vitamin D Def- check level and continue medications.   Continue diet and meds as discussed. Further disposition pending results of labs.  Quentin Mulling 4:47 PM

## 2013-05-28 LAB — BASIC METABOLIC PANEL WITH GFR
CO2: 30 mEq/L (ref 19–32)
Calcium: 10.6 mg/dL — ABNORMAL HIGH (ref 8.4–10.5)
Creat: 1.08 mg/dL (ref 0.50–1.10)
Glucose, Bld: 99 mg/dL (ref 70–99)

## 2013-05-28 LAB — HEPATIC FUNCTION PANEL
ALT: 10 U/L (ref 0–35)
AST: 19 U/L (ref 0–37)
Albumin: 4.7 g/dL (ref 3.5–5.2)

## 2013-05-28 LAB — INSULIN, FASTING: Insulin fasting, serum: 34 u[IU]/mL — ABNORMAL HIGH (ref 3–28)

## 2013-05-28 LAB — LIPID PANEL
Cholesterol: 163 mg/dL (ref 0–200)
HDL: 63 mg/dL (ref >39)
LDL Cholesterol: 86 mg/dL (ref 0–99)
Total CHOL/HDL Ratio: 2.6 Ratio

## 2013-07-26 ENCOUNTER — Other Ambulatory Visit: Payer: Self-pay | Admitting: Physician Assistant

## 2013-07-26 NOTE — Progress Notes (Signed)
Spoke with patient by phone.  She would like to reschedule her appts from 07/27/2013 to 08/18/2013.  POF generated to move appts to 08/18/2013, with lab at 1:45 and AB at 2:15.  Patient is aware of date and time and voices agreement.  Micah Flesher, PA-C 07/26/2013

## 2013-07-27 ENCOUNTER — Other Ambulatory Visit: Payer: 59

## 2013-07-27 ENCOUNTER — Ambulatory Visit: Payer: 59 | Admitting: Physician Assistant

## 2013-08-18 ENCOUNTER — Encounter: Payer: Self-pay | Admitting: Hematology and Oncology

## 2013-08-18 ENCOUNTER — Ambulatory Visit (HOSPITAL_COMMUNITY)
Admission: RE | Admit: 2013-08-18 | Discharge: 2013-08-18 | Disposition: A | Discharge status: Home / Self Care | Payer: 59 | Source: Ambulatory Visit | Attending: Hematology and Oncology | Admitting: Hematology and Oncology

## 2013-08-18 ENCOUNTER — Ambulatory Visit (HOSPITAL_BASED_OUTPATIENT_CLINIC_OR_DEPARTMENT_OTHER): Payer: 59 | Admitting: Hematology and Oncology

## 2013-08-18 ENCOUNTER — Other Ambulatory Visit: Payer: Self-pay

## 2013-08-18 ENCOUNTER — Other Ambulatory Visit (HOSPITAL_BASED_OUTPATIENT_CLINIC_OR_DEPARTMENT_OTHER): Payer: 59

## 2013-08-18 VITALS — BP 132/76 | HR 73 | Temp 97.6°F | Resp 18 | Ht 66.0 in | Wt 145.9 lb

## 2013-08-18 DIAGNOSIS — C50219 Malignant neoplasm of upper-inner quadrant of unspecified female breast: Secondary | ICD-10-CM

## 2013-08-18 DIAGNOSIS — C50919 Malignant neoplasm of unspecified site of unspecified female breast: Secondary | ICD-10-CM

## 2013-08-18 DIAGNOSIS — R071 Chest pain on breathing: Secondary | ICD-10-CM | POA: Insufficient documentation

## 2013-08-18 DIAGNOSIS — Z853 Personal history of malignant neoplasm of breast: Secondary | ICD-10-CM | POA: Insufficient documentation

## 2013-08-18 DIAGNOSIS — R350 Frequency of micturition: Secondary | ICD-10-CM

## 2013-08-18 DIAGNOSIS — Z170 Estrogen receptor positive status [ER+]: Secondary | ICD-10-CM

## 2013-08-18 DIAGNOSIS — R0789 Other chest pain: Secondary | ICD-10-CM

## 2013-08-18 DIAGNOSIS — Z9010 Acquired absence of unspecified breast and nipple: Secondary | ICD-10-CM | POA: Insufficient documentation

## 2013-08-18 DIAGNOSIS — C773 Secondary and unspecified malignant neoplasm of axilla and upper limb lymph nodes: Secondary | ICD-10-CM

## 2013-08-18 DIAGNOSIS — R42 Dizziness and giddiness: Secondary | ICD-10-CM

## 2013-08-18 DIAGNOSIS — T451X5A Adverse effect of antineoplastic and immunosuppressive drugs, initial encounter: Secondary | ICD-10-CM

## 2013-08-18 DIAGNOSIS — D6481 Anemia due to antineoplastic chemotherapy: Secondary | ICD-10-CM

## 2013-08-18 DIAGNOSIS — R079 Chest pain, unspecified: Secondary | ICD-10-CM

## 2013-08-18 LAB — COMPREHENSIVE METABOLIC PANEL (CC13)
ALK PHOS: 127 U/L (ref 40–150)
ALT: 12 U/L (ref 0–55)
AST: 20 U/L (ref 5–34)
Albumin: 4.3 g/dL (ref 3.5–5.0)
Anion Gap: 9 mEq/L (ref 3–11)
BILIRUBIN TOTAL: 0.92 mg/dL (ref 0.20–1.20)
BUN: 12.4 mg/dL (ref 7.0–26.0)
CO2: 29 meq/L (ref 22–29)
CREATININE: 1.1 mg/dL (ref 0.6–1.1)
Calcium: 10.7 mg/dL — ABNORMAL HIGH (ref 8.4–10.4)
Chloride: 103 mEq/L (ref 98–109)
Glucose: 102 mg/dl (ref 70–140)
Potassium: 4.1 mEq/L (ref 3.5–5.1)
SODIUM: 140 meq/L (ref 136–145)
TOTAL PROTEIN: 7.4 g/dL (ref 6.4–8.3)

## 2013-08-18 LAB — CBC WITH DIFFERENTIAL/PLATELET
BASO%: 0.6 % (ref 0.0–2.0)
Basophils Absolute: 0 10*3/uL (ref 0.0–0.1)
EOS%: 0.2 % (ref 0.0–7.0)
Eosinophils Absolute: 0 10*3/uL (ref 0.0–0.5)
HCT: 43.2 % (ref 34.8–46.6)
HGB: 14.2 g/dL (ref 11.6–15.9)
LYMPH%: 34.1 % (ref 14.0–49.7)
MCH: 30.6 pg (ref 25.1–34.0)
MCHC: 32.9 g/dL (ref 31.5–36.0)
MCV: 93.1 fL (ref 79.5–101.0)
MONO#: 0.4 10*3/uL (ref 0.1–0.9)
MONO%: 10.6 % (ref 0.0–14.0)
NEUT#: 2.2 10*3/uL (ref 1.5–6.5)
NEUT%: 54.5 % (ref 38.4–76.8)
PLATELETS: 192 10*3/uL (ref 145–400)
RBC: 4.64 10*6/uL (ref 3.70–5.45)
RDW: 12.9 % (ref 11.2–14.5)
WBC: 4 10*3/uL (ref 3.9–10.3)
lymph#: 1.4 10*3/uL (ref 0.9–3.3)

## 2013-08-18 NOTE — Progress Notes (Signed)
. IDJudyann Casasola Cutillo   DOB: [birth year]  MR#: [id number]  HBZ#:[id number]  PCP: [name], MD GYN: SUFanny Skates OTHER MD: Camie Patience   HISTORY OF PRESENT ILLNESS: Mrs. Moger had screening mammography October 03, 2008 which was unremarkable.  Towards the end of that year or early the next year, she noticed a little bit of discomfort and itching in both breasts, particularly around the nipple.  She brought this to Dr. Idell Pickles attention and was referred for a diagnostic mammography on August 28, 2009.  On exam, Dr. Miquel Dunn found diffuse firmness of the entire left breast with skin thickening.  By mammography, the left breast showed increased trabecular thickening and an ill-defined mass with developing calcifications in the upper outer quadrant.  The ultrasound on the same day showed in the left breast an irregular hypoechoic mass measuring 2.0 cm.  In the axilla on the left side there was at least one enlarged lymph node which measured 2 cm.  Biopsy was performed the same day of both the breast and the axilla and this showed ([id number]), an invasive ductal carcinoma, high grade.  The lymph node was also involved.  The tumor was estrogen receptor positive at 84%, progesterone receptor positive at 71% with an elevated MIB-1 of 56%.  There was no amplification of HER2/neu with a ratio of 1.32 by C[facility].  Bilateral breast MRIs were obtained on March 24.  This showed a symmetric edema throughout the left breast with skin thickening, most prominently anteriorly near the nipple.  Otherwise non-mass like enhancement involving the majority of the left breast including all four quadrants, in the upper inner left breast, there was an ill defined more mass like area which measured up to 2.5 cm, corresponding to the abnormality seen by ultrasound.  In total the abnormal area of enhancement measuring 7.3 cm in largest dimension.  There was also abnormal enhancement of the thickened skin in  the anterior left breast.  The pectoralis muscle did not appear to be involved.  On the right side there were some cysts and in the axilla there was one enlarged left axillary lymph node.  In addition, an incidental finding in the right lobe of the liver was a T2 bright lesion measuring approximately 1 cm.  Her subsequent history is as detailed below. . INTERVAL HISTORY: Maudie Mercury returns for followup of her breast cancer.She was last seen on 01/26/2013 She presents now new complains . She reports left chest wall pain over the past   6 months.She describes it as discomfort and heavy feeling,sometimes sharp when she lifts her left arm or when she moves.The pain/discomfort is getting worse. She denies cough or midsternal chest pain.She denies fever or abdominal pain.She denies any similar symptoms since surgery. . New also lightheaded feeling in particular when she gets up at night to urinate several times  over the past 6 months.She denies severe headaches,nausea or vomiting.She denies visual changes.   REVIEW OF SYSTEMS: Maudie Mercury  denies  rash, bleeding, unexplained fatigue or unexplained weight loss. She had a urinary tract infection previously which was treated by her primary care physician. She has  follow up with Dr. Artist Beach for her parathyroid and thyroid issues and hypercalcemia and she is scheduled for colonoscopy in the near future under Dr. May.She follows with Dr.Ingram and has an upcoming follow up with Primary MD next week.Plan for blood check since she is on Crestor. History of hemorrhoids? Incomplete bowel emptying ongoing problem   A detailed review  of systems today was noncontributory except as noted.   PAST MEDICAL HISTORY: Past Medical History  Diagnosis Date  . Hypertension   . Chest tightness   . Palpitations   . Hyperlipidemia   . Prediabetes   . Vitamin D deficiency   . GERD (gastroesophageal reflux disease)   . Breast cancer   history of chronic sinusitis, history of  hemorrhoids, history of bilateral tubal ligation,  history of prior multiple breast biopsies.  PAST SURGICAL HISTORY: Past Surgical History  Procedure Laterality Date  . Mastectomy  August 2011    Bilateral  . Parathyroidectomy    . Nasal sinus surgery      FAMILY HISTORY Family History  Problem Relation Age of Onset  . Diabetes Mother   . Cancer Mother     breast  The patient's father died at the age of 10 from complications of atherosclerotic coronary vascular disease.  The patient's mother is alive at 63 and the patient is engaged on a daily basis in her care, although the patient's mother does have a four-hour nurse who comes daily, and a Careers adviser who stays with her at night.  The patient's mother had breast cancer diagnosed at age 74.  The patient has no sisters.  She has two brothers, one of them has sickle cell trait, the other one has a history of prostate cancer.  The only breast cancers in the family that she is aware of are two cousins on the father's side, one of whom may have been diagnosed before the age of 108.  There was also a maternal aunt who may have been diagnosed with ovarian cancer at the age of 30, but this is not clear.  GYNECOLOGIC HISTORY: She is Gx, P1, first pregnancy to term age 11.  She went through the change of life at 4.  She did not take hormone replacement therapy.  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  Her husband of >20 years, [name], retired from [employer] and currently works as a Paediatric nurse (commutes).  I should add that I saw [name] remotely for what proved to be a history of glomerulonephritis.  [name] was my patient, Novi Calia, and [name] and [name]'s daughter, [name], is Bryson's stepdaughter.  [name] works as an Programmer, applications. at [employer].  [name];y's biological son is [name]; he works in Insurance underwriter and part time as a Chief Operating Officer.  Terrea has two biological and three step-grandchildren.  She is a  Tourist information centre manager   ADVANCED DIRECTIVES:  HEALTH MAINTENANCE: History  Substance Use Topics  . Smoking status: Never Smoker   . Smokeless tobacco: Never Used  . Alcohol Use: No     Colonoscopy:  PAP:  Bone density: Done  8/6/ 2014  Lipid panel:  UTD, Dr. Melford Aase  Allergies  Allergen Reactions  . Minocycline   . Prednisone Swelling  . Sulfa Antibiotics     Patient does not remember the type of reaction, happened years ago.  . Zithromax [Azithromycin] Swelling  . Penicillins Rash    Current Outpatient Prescriptions  Medication Sig Dispense Refill  . aspirin 81 MG tablet Take 81 mg by mouth daily.        . bisoprolol-hydrochlorothiazide (ZIAC) 5-6.25 MG per tablet Take 1 tablet by mouth daily.        . Cholecalciferol (VITAMIN D-3) 5000 UNITS TABS Take 1 tablet by mouth daily.        . fluticasone (FLONASE) 50 MCG/ACT nasal spray  2 sprays by Nasal route daily.        Marland Kitchen latanoprost (XALATAN) 0.005 % ophthalmic solution       . letrozole (FEMARA) 2.5 MG tablet Take 1 tablet (2.5 mg total) by mouth daily.  30 tablet  12  . quinapril (ACCUPRIL) 20 MG tablet Take 10 mg by mouth at bedtime.        . rosuvastatin (CRESTOR) 20 MG tablet Take 10 mg by mouth daily.        Marland Kitchen tetrahydrozoline (EYE DROPS) 0.05 % ophthalmic solution Place 1 drop into both eyes daily.       No current facility-administered medications for this visit.    OBJECTIVE: middle-aged African American woman in no acute distress Filed Vitals:   08/18/13 1426  BP: 132/76  Pulse: 73  Temp: 97.6 F (36.4 C)  Resp: 18     Body mass index is 23.56 kg/(m^2).    ECOG FS: 0 Filed Weights   08/18/13 1426  Weight: 145 lb 14.4 oz (66.18 kg)   Sclerae unicteric, pupils equal round and reactive to light Oropharynx clear No cervical or supraclavicular adenopathy Lungs clear to auscultation, no rales or rhonchi Heart regular rate and rhythm Abdomen soft, nontender, positive bowel sounds.No hepatosplenomegaly. MSK no focal  spinal tenderness, no peripheral edema Neuro: nonfocal, well oriented, pleasant affect Breasts: status post bilateral mastectomies. There is no evidence of local recurrence. Axillae are benign bilaterally There are no chest wall nodules along the mastectomy incisions.There is a noticeable fullness left lower rib cage/? Subcutaneous dependent  edema. No left upper extremity lymphedema.  LAB RESULTS: Lab Results  Component Value Date   WBC 4.0 08/18/2013   NEUTROABS 2.2 08/18/2013   HGB 14.2 08/18/2013   HCT 43.2 08/18/2013   MCV 93.1 08/18/2013   PLT 192 08/18/2013      Chemistry      Component Value Date/Time   NA 140 08/18/2013 1352   NA 141 05/27/2013 1708   NA 138 12/16/2011 1410   K 4.1 08/18/2013 1352   K 4.2 05/27/2013 1708   K 4.0 12/16/2011 1410   CL 101 05/27/2013 1708   CL 104 07/28/2012 1437   CL 100 12/16/2011 1410   CO2 29 08/18/2013 1352   CO2 30 05/27/2013 1708   CO2 31 12/16/2011 1410   BUN 12.4 08/18/2013 1352   BUN 18 05/27/2013 1708   BUN 14 12/16/2011 1410   CREATININE 1.1 08/18/2013 1352   CREATININE 1.08 05/27/2013 1708   CREATININE 1.10 09/29/2012 1139      Component Value Date/Time   CALCIUM 10.7* 08/18/2013 1352   CALCIUM 10.6* 05/27/2013 1708   CALCIUM 10.8* 07/28/2012 1618   CALCIUM 9.7 12/16/2011 1410   ALKPHOS 127 08/18/2013 1352   ALKPHOS 121* 05/27/2013 1708   ALKPHOS 117* 12/16/2011 1410   AST 20 08/18/2013 1352   AST 19 05/27/2013 1708   AST 23 12/16/2011 1410   ALT 12 08/18/2013 1352   ALT 10 05/27/2013 1708   ALT 18 12/16/2011 1410   BILITOT 0.92 08/18/2013 1352   BILITOT 0.9 05/27/2013 1708   BILITOT 1.10 12/16/2011 1410       Lab Results  Component Value Date   LABCA2 17 07/01/2011    STUDIES: Dg Bone Density Peripheral Skeleton  01/13/2013   The Bone Mineral Densitometry hard-copy report (which includes all data, graphical display, and FRAX results when applicable) has been sent directly to the ordering physician.  This report can also be obtained  electronically by viewing images for this exam through the performing facility's EMR, or by logging directly into [employer].   Original Report Authenticated By: Lavonia Dana, M.D.   ASSESSMENT: 68 y.o. BRCA 1/2 negative [place] woman   (1) status post left breast biopsy in March 2011 for a high-grade invasive ductal carcinoma, with biopsy-proven axillary lymph node involvement at presentation. ER/PR positive, HER2 negative, with MIB-1 of 56%.   (2) received neoadjuvant chemotherapy with docetaxel, doxorubicin and cyclophosphamide x4, discontinued due to peripheral neuropathy.  (3) received 1 cycle of carboplatin and gemcitabine, also stopped due to worsening neuropathy.   (4) status post bilateral mastectomies and left axillary lymph node dissection in August 2011, the result showing a complete pathologic response in both the breast and the lymph nodes  (5) completed radiation therapy November 2011  (6) started letrozole November 2011; normal bone density 01/13/2013  (7) elevated PTH and calcium but no localizable adenoma, with history of remote parathyroidectomy  (8) bilateral solitary thyroid nodules, status post right thyroid nodule biopsy April 2014 consistent with benign goiter.   PLAN:  Breast cancer  as above currently on Letrozole continue. She is not on Calcium due to elevated level,and takes ViD? 2500 IU daily     . She had  Bone density on 01/2013 that showed osteopenia ,need repeat in 01/2015.  Will check chest xray with left rib detail today to evaluate chest wall pain, consider Bone scan or CT Chest if persists. Sent patient to [facility] to fit a new prosthesis ,as still uses the original and  could be  contributing to her symptoms. She has left lower anterior chest wall subqutaneous edema but no left arm lymphedema.  Dizziness with polyuria,nocturia,constipation are symptoms of hypercalcemia although her calcium today was 10.7 .Will reevaluate in 1 month and if  symptoms persist will consider MRI brain.  She will see her Primary MD this week and have UA checked in light of prior UTI. Her calcium is being closely followed by Dr. Melford Aase and at this point she is not motivated to go to [facility] for further evaluation of deep parathyroid adenoma    She knows to call for any problems that may develop before her next visit here.  ! Month follow up,CA27-29,Vit D level      Anijah Spohr    08/18/2013.  Oncology/Hematology

## 2013-08-19 ENCOUNTER — Other Ambulatory Visit: Payer: Self-pay

## 2013-08-19 LAB — PTH, INTACT AND CALCIUM
Calcium: 10.3 mg/dL (ref 8.4–10.5)
PTH: 118 pg/mL — ABNORMAL HIGH (ref 14.0–72.0)

## 2013-08-20 ENCOUNTER — Telehealth: Payer: Self-pay | Admitting: Oncology

## 2013-08-20 NOTE — Telephone Encounter (Signed)
s/w pt re appt for 4/10. message to desk nurse per pt re results for xray.

## 2013-08-27 ENCOUNTER — Other Ambulatory Visit: Payer: Self-pay | Admitting: Internal Medicine

## 2013-08-27 ENCOUNTER — Other Ambulatory Visit: Payer: Self-pay | Admitting: *Deleted

## 2013-08-27 DIAGNOSIS — C50919 Malignant neoplasm of unspecified site of unspecified female breast: Secondary | ICD-10-CM

## 2013-08-27 MED ORDER — LETROZOLE 2.5 MG PO TABS: 2.5000 mg | ORAL_TABLET | Freq: Every day | ORAL | Status: DC

## 2013-08-30 ENCOUNTER — Encounter: Payer: Self-pay | Admitting: Internal Medicine

## 2013-08-30 ENCOUNTER — Ambulatory Visit (INDEPENDENT_AMBULATORY_CARE_PROVIDER_SITE_OTHER): Payer: 59 | Admitting: Internal Medicine

## 2013-08-30 VITALS — BP 142/84 | HR 60 | Temp 97.9°F | Resp 16 | Ht 66.25 in | Wt 146.6 lb

## 2013-08-30 DIAGNOSIS — R7303 Prediabetes: Secondary | ICD-10-CM

## 2013-08-30 DIAGNOSIS — Z0000 Encounter for general adult medical examination without abnormal findings: Secondary | ICD-10-CM

## 2013-08-30 DIAGNOSIS — I10 Essential (primary) hypertension: Secondary | ICD-10-CM

## 2013-08-30 DIAGNOSIS — Z1331 Encounter for screening for depression: Secondary | ICD-10-CM

## 2013-08-30 DIAGNOSIS — Z111 Encounter for screening for respiratory tuberculosis: Secondary | ICD-10-CM

## 2013-08-30 DIAGNOSIS — Z1212 Encounter for screening for malignant neoplasm of rectum: Secondary | ICD-10-CM

## 2013-08-30 DIAGNOSIS — E559 Vitamin D deficiency, unspecified: Secondary | ICD-10-CM

## 2013-08-30 DIAGNOSIS — Z789 Other specified health status: Secondary | ICD-10-CM

## 2013-08-30 DIAGNOSIS — Z79899 Other long term (current) drug therapy: Secondary | ICD-10-CM | POA: Insufficient documentation

## 2013-08-30 NOTE — Progress Notes (Signed)
Patient ID: Shannon Nielsen, female   DOB: 1945/10/04, 68 y.o.   MRN: [id number]   Annual Screening Comprehensive Examination  This very nice 68 y.o. MBF presents for complete physical.  Patient has been followed for HTN, Prediabetes, Hyperlipidemia, Hx/o Lt Breast Cancer  (01/2010)  and Vitamin D Deficiency.    HTN predates since 14. Patient's BP has been controlled at home. Today's BP: 142/84 mmHg. Patient denies any cardiac symptoms as chest pain, palpitations, shortness of breath, dizziness or ankle swelling.   Patient's hyperlipidemia is controlled with diet and medications. Patient denies myalgias or other medication SE's. Last cholesterol last visit was 163, triglycerides 69, HDL 63 and LDL 86 in Dec 2014.     Patient has prediabetes A1c 5.9% in Dec 2011 and  last A1c 6.0% in Dec 2014. Patient denies reactive hypoglycemic symptoms, visual blurring, diabetic polys, or paresthesias.    Regarding her Breast cancer she was ER (+) and HER (-) and underwent chemoradiation and is maintained on letrozole monitored by Dr Jana Hakim.   Finally, patient has history of Vitamin D Deficiency of 13 in 2008 with last vitamin D 81 in Sept 2014.   Medication Sig  . aspirin 81 MG tablet Take 81 mg by mouth daily.    . bisoprolol-hydrochlorothiazide (ZIAC) 5-6.25 MG per tablet Take 1 tablet by mouth daily.    . Cholecalciferol (VITAMIN D-3) 5000 UNITS TABS Take 1 tablet by mouth daily.    . fluticasone (FLONASE) 50 MCG/ACT nasal spray Use 1 spray 2 times a day  . letrozole (FEMARA) 2.5 MG tablet Take 1 tablet (2.5 mg total) by mouth daily.  . quinapril (ACCUPRIL) 20 MG tablet Take 10 mg by mouth at bedtime.    . rosuvastatin (CRESTOR) 20 MG tablet Take 10 mg by mouth daily.    Marland Kitchen tetrahydrozoline (EYE DROPS) 0.05 % ophthalmic solution Place 1 drop into both eyes daily.    Allergies  Allergen Reactions  . Minocycline   . Prednisone Swelling  . Sulfa Antibiotics     Patient does not remember the type  of reaction, happened years ago.  . Zithromax [Azithromycin] Swelling  . Penicillins Rash    Past Medical History  Diagnosis Date  . Hypertension   . Chest tightness   . Palpitations   . Hyperlipidemia   . Prediabetes   . Vitamin D deficiency   . GERD (gastroesophageal reflux disease)   . Breast cancer     Past Surgical History  Procedure Laterality Date  . Mastectomy  August 2011    Bilateral  . Parathyroidectomy    . Nasal sinus surgery      Family History  Problem Relation Age of Onset  . Diabetes Mother   . Cancer Mother     breast    History  Substance Use Topics  . Smoking status: Never Smoker   . Smokeless tobacco: Never Used  . Alcohol Use: No    ROS Constitutional: Denies fever, chills, weight loss/gain, headaches, insomnia, fatigue, night sweats, and change in appetite. Eyes: Denies redness, blurred vision, diplopia, discharge, itchy, watery eyes.  ENT: Denies discharge, congestion, post nasal drip, epistaxis, sore throat, earache, hearing loss, dental pain, Tinnitus, Vertigo, Sinus pain, snoring.  Cardio: Denies chest pain, palpitations, irregular heartbeat, syncope, dyspnea, diaphoresis, orthopnea, PND, claudication, edema Respiratory: denies cough, dyspnea, DOE, pleurisy, hoarseness, laryngitis, wheezing.  Gastrointestinal: Denies dysphagia, heartburn, reflux, water brash, pain, cramps, nausea, vomiting, bloating, diarrhea, constipation, hematemesis, melena, hematochezia, jaundice, hemorrhoids Genitourinary: Denies dysuria, frequency,  urgency, nocturia, hesitancy, discharge, hematuria, flank pain Musculoskeletal: Denies arthralgia, myalgia, stiffness, Jt. Swelling, pain, limp, and strain/sprain. Skin: Denies puritis, rash, hives, warts, acne, eczema, changing in skin lesion Neuro: No weakness, tremor, incoordination, spasms, paresthesia, pain Psychiatric: Denies confusion, memory loss, sensory loss Endocrine: Denies change in weight, skin, hair change,  nocturia, and paresthesia, diabetic polys, visual blurring, hyper / hypo glycemic episodes.  Heme/Lymph: No excessive bleeding, bruising, enlarged lymph nodes.   Physical Exam  BP 142/84  Pulse 60  Temp(Src) 97.9 F (36.6 C) (Temporal)  Resp 16  Ht 5' 6.25" (1.683 m)  Wt 146 lb 9.6 oz (66.497 kg)  BMI 23.48 kg/m2  General Appearance: Well nourished, in no apparent distress. Eyes: PERRLA, EOMs, conjunctiva no swelling or erythema, normal fundi and vessels. Sinuses: No frontal/maxillary tenderness ENT/Mouth: EACs patent / TMs  nl. Nares clear without erythema, swelling, mucoid exudates. Oral hygiene is good. No erythema, swelling, or exudate. Tongue normal, non-obstructing. Tonsils not swollen or erythematous. Hearing normal.  Neck: Supple, thyroid normal. No bruits, nodes or JVD. Respiratory: Respiratory effort normal.  BS equal and clear bilateral without rales, rhonci, wheezing or stridor. Cardio: Heart sounds are normal with regular rate and rhythm and no murmurs, rubs or gallops. Peripheral pulses are normal and equal bilaterally without edema. No aortic or femoral bruits. Chest: symmetric with normal excursions and percussion. Breasts: Bilat. surgically absent with well-healed surgical scars w/o signs of ulceration or nodularity and axillae are clear Bilat. Abdomen: Flat, soft, with bowl sounds. Nontender, no guarding, rebound, hernias, masses, or organomegaly.  Lymphatics: Non tender without lymphadenopathy.  Genitourinary:  Musculoskeletal: Full ROM all peripheral extremities, joint stability, 5/5 strength, and normal gait. Skin: Warm and dry without rashes, lesions, cyanosis, clubbing or  ecchymosis.  Neuro: Cranial nerves intact, reflexes equal bilaterally. Normal muscle tone, no cerebellar symptoms. Sensation intact.  Pysch: Awake and oriented X 3, normal affect, Insight and Judgment appropriate.   Assessment and Plan  1. Annual Screening Examination 2. Hypertension  3.  Hyperlipidemia 4. Pre Diabetes 5. Vitamin D Deficiency 6. Hx/o Lt Breast Cancer in Remission  Continue prudent diet as discussed, weight control, BP monitoring, regular exercise, and medications. Discussed med's effects and SE's. Screening labs and tests as requested with regular follow-up as recommended.

## 2013-08-30 NOTE — Patient Instructions (Signed)

## 2013-08-31 LAB — LIPID PANEL
CHOL/HDL RATIO: 2.6 ratio
Cholesterol: 170 mg/dL (ref 0–200)
HDL: 66 mg/dL (ref >39)
LDL Cholesterol: 81 mg/dL (ref 0–99)
TRIGLYCERIDES: 113 mg/dL (ref <150)
VLDL: 23 mg/dL (ref 0–40)

## 2013-08-31 LAB — URINALYSIS, MICROSCOPIC ONLY
BACTERIA UA: NONE SEEN
CRYSTALS: NONE SEEN
Casts: NONE SEEN
SQUAMOUS EPITHELIAL / LPF: NONE SEEN

## 2013-08-31 LAB — VITAMIN B12: Vitamin B-12: 317 pg/mL (ref 211–911)

## 2013-08-31 LAB — HEMOGLOBIN A1C
Hgb A1c MFr Bld: 5.9 % — ABNORMAL HIGH (ref <5.7)
Mean Plasma Glucose: 123 mg/dL — ABNORMAL HIGH (ref <117)

## 2013-08-31 LAB — MICROALBUMIN / CREATININE URINE RATIO
CREATININE, URINE: 44.3 mg/dL
Microalb Creat Ratio: 11.3 mg/g (ref 0.0–30.0)
Microalb, Ur: 0.5 mg/dL (ref 0.00–1.89)

## 2013-08-31 LAB — INSULIN, FASTING: Insulin fasting, serum: 6 u[IU]/mL (ref 3–28)

## 2013-08-31 LAB — VITAMIN D 25 HYDROXY (VIT D DEFICIENCY, FRACTURES): Vit D, 25-Hydroxy: 82 ng/mL (ref 30–89)

## 2013-08-31 LAB — TSH: TSH: 0.668 u[IU]/mL (ref 0.350–4.500)

## 2013-08-31 LAB — MAGNESIUM: Magnesium: 2.1 mg/dL (ref 1.5–2.5)

## 2013-09-03 LAB — TB SKIN TEST
INDURATION: 0 mm
TB Skin Test: NEGATIVE

## 2013-09-07 ENCOUNTER — Other Ambulatory Visit: Payer: Self-pay | Admitting: Oncology

## 2013-09-07 ENCOUNTER — Telehealth: Payer: Self-pay | Admitting: Oncology

## 2013-09-07 NOTE — Telephone Encounter (Signed)
PER 3/31 POF 4/10 APPT MOVED TO 5/11. S/W PT SHE IS AWARE.

## 2013-09-17 ENCOUNTER — Ambulatory Visit: Payer: 59

## 2013-09-17 ENCOUNTER — Other Ambulatory Visit: Payer: 59

## 2013-09-21 ENCOUNTER — Other Ambulatory Visit: Payer: Self-pay | Admitting: *Deleted

## 2013-09-21 MED ORDER — BISOPROLOL-HYDROCHLOROTHIAZIDE 5-6.25 MG PO TABS: 1.0000 | ORAL_TABLET | Freq: Every day | ORAL | Status: DC

## 2013-10-18 ENCOUNTER — Other Ambulatory Visit (HOSPITAL_BASED_OUTPATIENT_CLINIC_OR_DEPARTMENT_OTHER): Payer: 59

## 2013-10-18 ENCOUNTER — Ambulatory Visit (HOSPITAL_BASED_OUTPATIENT_CLINIC_OR_DEPARTMENT_OTHER): Payer: 59 | Admitting: Hematology and Oncology

## 2013-10-18 ENCOUNTER — Telehealth: Payer: Self-pay | Admitting: Hematology and Oncology

## 2013-10-18 VITALS — BP 125/70 | HR 71 | Temp 97.9°F | Resp 18 | Ht 66.25 in | Wt 148.2 lb

## 2013-10-18 DIAGNOSIS — C773 Secondary and unspecified malignant neoplasm of axilla and upper limb lymph nodes: Secondary | ICD-10-CM

## 2013-10-18 DIAGNOSIS — I890 Lymphedema, not elsewhere classified: Secondary | ICD-10-CM

## 2013-10-18 DIAGNOSIS — C50919 Malignant neoplasm of unspecified site of unspecified female breast: Secondary | ICD-10-CM

## 2013-10-18 DIAGNOSIS — E215 Disorder of parathyroid gland, unspecified: Secondary | ICD-10-CM

## 2013-10-18 DIAGNOSIS — Z79811 Long term (current) use of aromatase inhibitors: Secondary | ICD-10-CM

## 2013-10-18 DIAGNOSIS — Z170 Estrogen receptor positive status [ER+]: Secondary | ICD-10-CM

## 2013-10-18 DIAGNOSIS — R071 Chest pain on breathing: Secondary | ICD-10-CM

## 2013-10-18 DIAGNOSIS — Z9010 Acquired absence of unspecified breast and nipple: Secondary | ICD-10-CM

## 2013-10-18 NOTE — Progress Notes (Signed)
. IDFelicite Zeimet Nielsen   DOB: 1946/04/11  MR#: [id number]  KDX#:[id number]  PCP: Shannon Richards, MD GYN: SUFanny Nielsen OTHER MD: Shannon Nielsen   Chief complaint: Followup visit for breast cancer   HISTORY OF PRESENT ILLNESS: As per previously documented note: Shannon Nielsen had screening mammography October 03, 2008 which was unremarkable.  Towards the end of that year or early the next year, she noticed a little bit of discomfort and itching in both breasts, particularly around the nipple.  She brought this to Dr. Idell Nielsen attention and was referred for a diagnostic mammography on August 28, 2009.  On exam, Dr. Miquel Nielsen found diffuse firmness of the entire left breast with skin thickening.  By mammography, the left breast showed increased trabecular thickening and an ill-defined mass with developing calcifications in the upper outer quadrant.  The ultrasound on the same day showed in the left breast an irregular hypoechoic mass measuring 2.0 cm.  In the axilla on the left side there was at least one enlarged lymph node which measured 2 cm.  Biopsy was performed the same day of both the breast and the axilla and this showed ([id number]), an invasive ductal carcinoma, high grade.  The lymph node was also involved.  The tumor was estrogen receptor positive at 84%, progesterone receptor positive at 71% with an elevated MIB-1 of 56%.  There was no amplification of HER2/neu with a ratio of 1.32 by [facility].  Bilateral breast MRIs were obtained on March 24.  This showed a symmetric edema throughout the left breast with skin thickening, most prominently anteriorly near the nipple.  Otherwise non-mass like enhancement involving the majority of the left breast including all four quadrants, in the upper inner left breast, there was an ill defined more mass like area which measured up to 2.5 cm, corresponding to the abnormality seen by ultrasound.  In total the abnormal area of enhancement measuring 7.3  cm in largest dimension.  There was also abnormal enhancement of the thickened skin in the anterior left breast.  The pectoralis muscle did not appear to be involved.  On the right side there were some cysts and in the axilla there was one enlarged left axillary lymph node.  In addition, an incidental finding in the right lobe of the liver was a T2 bright lesion measuring approximately 1 cm.  Her subsequent history is as detailed below. . INTERVAL HISTORY: Dispense is here for followup visit today. Since the time of last visit on she had chest x-ray done of her left arm chest wall pain on 08/18/2013 and that was negative for any suspicious lesions. She says she continued to have tightness in both the mastectomy scar area but left is more than the right and in also complains of sharp pains once in a while in left chest wall area.   She denies any decrease in appetite or weight loss, constipation, blood in the stool blood in the urine, fever, blurred vision, headaches,  shortness of breath or palpitations. She denies any urinary issues at this time. She also complains of neuropathy pain in her upper and lower extremities and also chest wall  REVIEW OF SYSTEMS:  A 10 point review of systems is been assessed and pertinent symptoms were mentioned in interval history  PAST MEDICAL HISTORY: Past Medical History  Diagnosis Date  . Hypertension   . Chest tightness   . Palpitations   . Hyperlipidemia   . Prediabetes   . Vitamin D deficiency   .  GERD (gastroesophageal reflux disease)   . Breast cancer   history of chronic sinusitis, history of hemorrhoids, history of bilateral tubal ligation,  history of prior multiple breast biopsies.  PAST SURGICAL HISTORY: Past Surgical History  Procedure Laterality Date  . Mastectomy  August 2011    Bilateral  . Parathyroidectomy    . Nasal sinus surgery      FAMILY HISTORY Family History  Problem Relation Age of Onset  . Diabetes Mother   . Cancer  Mother     breast  The patient's father died at the age of 88 from complications of atherosclerotic coronary vascular disease.  The patient's mother is alive at 41 and the patient is engaged on a daily basis in her care, although the patient's mother does have a four-hour nurse who comes daily, and a Careers adviser who stays with her at night.  The patient's mother had breast cancer diagnosed at age 29.  The patient has no sisters.  She has two brothers, one of them has sickle cell trait, the other one has a history of prostate cancer.  The only breast cancers in the family that she is aware of are two cousins on the father's side, one of whom may have been diagnosed before the age of 75.  There was also a maternal aunt who may have been diagnosed with ovarian cancer at the age of 59, but this is not clear.  GYNECOLOGIC HISTORY: She is Gx, P1, first pregnancy to term age 11.  She went through the change of life at 73.  She did not take hormone replacement therapy.  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  Her husband of >20 years, Shannon Nielsen, retired from [employer] and currently works as a Paediatric nurse (commutes).  I should add that I saw Shannon Nielsen remotely for what proved to be a history of glomerulonephritis.  Shannon Nielsen's first wife was my patient, Shannon Nielsen, and Shannon Nielsen and Shannon Nielsen's daughter, Shannon Nielsen, is Shannon Nielsen's stepdaughter.  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  Shannon Nielsen;y's biological son is Shannon Nielsen; he works in Insurance underwriter and part time as a Chief Operating Officer.  Shannon Nielsen has two biological and three step-grandchildren.  She is a Tourist information centre manager   ADVANCED DIRECTIVES:  HEALTH MAINTENANCE: History  Substance Use Topics  . Smoking status: Never Smoker   . Smokeless tobacco: Never Used  . Alcohol Use: No     Colonoscopy:  PAP:  Bone density: Done  8/6/ 2014  Lipid panel:  UTD, Dr. Melford Nielsen  Allergies  Allergen Reactions  . Minocycline   . Prednisone Swelling  . Sulfa Antibiotics      Patient does not remember the type of reaction, happened years ago.  . Zithromax [Azithromycin] Swelling  . Penicillins Rash    Current Outpatient Prescriptions  Medication Sig Dispense Refill  . aspirin 81 MG tablet Take 81 mg by mouth daily.        . bisoprolol-hydrochlorothiazide (ZIAC) 5-6.25 MG per tablet Take 1 tablet by mouth daily.  30 tablet  3  . Cholecalciferol (VITAMIN D-3) 5000 UNITS TABS Take 1 tablet by mouth daily.        . fluticasone (FLONASE) 50 MCG/ACT nasal spray Use 1 spray 2 times a day  16 g  PRN  . letrozole (FEMARA) 2.5 MG tablet Take 1 tablet (2.5 mg total) by mouth daily.  30 tablet  1  . quinapril (ACCUPRIL) 20 MG tablet Take 10 mg by mouth at bedtime.        Marland Kitchen  rosuvastatin (CRESTOR) 20 MG tablet Take 10 mg by mouth daily.        Marland Kitchen tetrahydrozoline (EYE DROPS) 0.05 % ophthalmic solution Place 1 drop into both eyes daily.       No current facility-administered medications for this visit.    OBJECTIVE: middle-aged African American woman in no acute distress Filed Vitals:   10/18/13 1403  BP: 125/70  Pulse: 71  Temp: 97.9 F (36.6 C)  Resp: 18     Body mass index is 23.73 kg/(m^2).    ECOG FS: 0 Filed Weights   10/18/13 1403  Weight: 148 lb 3.2 oz (67.223 kg)   HEENT PERRLA, sclerae anicteric, conjunctiva no pallor, neck supple no JVD no thyromegaly No cervical or supraclavicular adenopathy Lungs clear to auscultation, no rales or rhonchi Heart regular rate and rhythm Abdomen soft, nontender, positive bowel sounds.No hepatosplenomegaly. MSK no focal spinal tenderness, no peripheral edema Neuro: nonfocal, well oriented, pleasant affect Breasts: status post bilateral mastectomies. There is no evidence of local recurrence. Axillae are benign bilaterally There are no chest wall nodules along the mastectomy incisions.there is notable chest wall edema noted . No left upper extremity lymphedema.  LAB RESULTS: Lab Results  Component Value Date   WBC  4.0 08/18/2013   NEUTROABS 2.2 08/18/2013   HGB 14.2 08/18/2013   HCT 43.2 08/18/2013   MCV 93.1 08/18/2013   PLT 192 08/18/2013      Chemistry      Component Value Date/Time   NA 140 08/18/2013 1352   NA 141 05/27/2013 1708   NA 138 12/16/2011 1410   K 4.1 08/18/2013 1352   K 4.2 05/27/2013 1708   K 4.0 12/16/2011 1410   CL 101 05/27/2013 1708   CL 104 07/28/2012 1437   CL 100 12/16/2011 1410   CO2 29 08/18/2013 1352   CO2 30 05/27/2013 1708   CO2 31 12/16/2011 1410   BUN 12.4 08/18/2013 1352   BUN 18 05/27/2013 1708   BUN 14 12/16/2011 1410   CREATININE 1.1 08/18/2013 1352   CREATININE 1.08 05/27/2013 1708   CREATININE 1.10 09/29/2012 1139      Component Value Date/Time   CALCIUM 10.7* 08/18/2013 1352   CALCIUM 10.3 08/18/2013 1351   CALCIUM 10.8* 07/28/2012 1618   CALCIUM 9.7 12/16/2011 1410   ALKPHOS 127 08/18/2013 1352   ALKPHOS 121* 05/27/2013 1708   ALKPHOS 117* 12/16/2011 1410   AST 20 08/18/2013 1352   AST 19 05/27/2013 1708   AST 23 12/16/2011 1410   ALT 12 08/18/2013 1352   ALT 10 05/27/2013 1708   ALT 18 12/16/2011 1410   BILITOT 0.92 08/18/2013 1352   BILITOT 0.9 05/27/2013 1708   BILITOT 1.10 12/16/2011 1410       Lab Results  Component Value Date   LABCA2 17 07/01/2011    STUDIES: Dg Bone Density Peripheral Skeleton  01/13/2013   The Bone Mineral Densitometry hard-copy report (which includes all data, graphical display, and FRAX results when applicable) has been sent directly to the ordering physician.  This report can also be obtained electronically by viewing images for this exam through the performing facility's EMR, or by logging directly into [employer].   Original Report Authenticated By: Lavonia Dana, M.D.   ASSESSMENT: 68 y.o. BRCA 1/2 negative [place] woman   (1) status post left breast biopsy in March 2011 for a high-grade invasive ductal carcinoma, with biopsy-proven axillary lymph node involvement at presentation. ER/PR positive, HER2 negative, with MIB-1 of 56%.    (  2) received neoadjuvant chemotherapy with docetaxel, doxorubicin and cyclophosphamide x4, discontinued due to peripheral neuropathy.  (3) received 1 cycle of carboplatin and gemcitabine, also stopped due to worsening neuropathy.   (4) status post bilateral mastectomies and left axillary lymph node dissection in August 2011, the result showing a complete pathologic response in both the breast and the lymph nodes  (5) completed radiation therapy November 2011  (6) started letrozole November 2011; normal bone density 01/13/2013  (7) elevated PTH and calcium but no localizable adenoma, with history of remote parathyroidectomy  (8) bilateral solitary thyroid nodules, status post right thyroid nodule biopsy April 2014 consistent with benign goiter.  (9) left-sided chest wall lymphedema   PLAN:  Ms. Leetta is tolerating letrozole quite well with minimal side effects of arms or flashes. In view of her left-sided chest wall tightness and lymphedema we'll refer the patient to lymphedema clinic for treatment. We'll also obtain ultrasound of the left chest wall area to assess for any fluid accumulation/chest wall lesions. After review of sonogram, If needed we'll arrange for CT of the chest or bone scan. I have reviewed her labs from the primary care physician and those are acceptable except the slightly elevated calcium level related to parathyroid condition continue letrozole for total of 5 years until November 2016. We'll advance her office visits to once a year as recommended previously. Of course I have instructed the patient if she notices worsening of chest wall edema after physical therapy or if she notices any changes to call our office and visit [place] sooner.  She is not on Calcium due to elevated level,and she'll be decreasing her vitamin D intake to 1000iu/day Her  Bone density on 01/2013 that showed osteopenia , next bone density scan in 01/2015.  Her calcium is being closely followed by  Dr. Melford Nielsen and follow up with [facility] as scheduled for parathyroid adenoma   Follow up with Dr. Dalbert Batman as scheduled  Next followup visit in one year with CBC and differential and CMP to be performed in primary care physician's office prior to next visit   She knows to call for any problems that may develop before her next visit here.  Wilmon Arms  , M.D.   10/18/2013.  Oncology/Hematology

## 2013-10-18 NOTE — Telephone Encounter (Signed)
per pof to sch pt appts-sent fax to BI to sch [place] req for GI to call pt  [phone] cell# w/appt time & date

## 2013-10-20 ENCOUNTER — Other Ambulatory Visit: Payer: Self-pay

## 2013-10-20 ENCOUNTER — Telehealth: Payer: Self-pay | Admitting: Oncology

## 2013-10-20 DIAGNOSIS — C50919 Malignant neoplasm of unspecified site of unspecified female breast: Secondary | ICD-10-CM

## 2013-10-20 NOTE — Progress Notes (Signed)
Call rcvd from [facility] to clarify orders for pt.  Per GI - Ultrasound will have to be done at [facility] at [address].  Orders re-entered.   Appt made with Judeen Hammans at [facility] - 10/22/13 at 2 pm.  Let pt know d/t - pt voiced understanding.

## 2013-10-20 NOTE — Telephone Encounter (Signed)
returned pt call and confirmed 5.15 appt @ GI and gv # to call

## 2013-10-22 ENCOUNTER — Other Ambulatory Visit: Payer: Self-pay | Admitting: Hematology and Oncology

## 2013-10-22 ENCOUNTER — Ambulatory Visit
Admission: RE | Admit: 2013-10-22 | Discharge: 2013-10-22 | Disposition: A | Discharge status: Home / Self Care | Payer: 59 | Source: Ambulatory Visit | Attending: Hematology and Oncology | Admitting: Hematology and Oncology

## 2013-10-22 ENCOUNTER — Other Ambulatory Visit: Payer: 59

## 2013-10-22 DIAGNOSIS — C50919 Malignant neoplasm of unspecified site of unspecified female breast: Secondary | ICD-10-CM

## 2013-10-28 ENCOUNTER — Other Ambulatory Visit: Payer: Self-pay | Admitting: *Deleted

## 2013-10-28 DIAGNOSIS — C50919 Malignant neoplasm of unspecified site of unspecified female breast: Secondary | ICD-10-CM

## 2013-10-28 MED ORDER — LETROZOLE 2.5 MG PO TABS: 2.5000 mg | ORAL_TABLET | Freq: Every day | ORAL | Status: DC

## 2013-10-29 ENCOUNTER — Ambulatory Visit (INDEPENDENT_AMBULATORY_CARE_PROVIDER_SITE_OTHER): Payer: 59 | Admitting: Physician Assistant

## 2013-10-29 VITALS — BP 128/78 | HR 76 | Temp 98.2°F | Resp 16 | Wt 144.0 lb

## 2013-10-29 DIAGNOSIS — R5383 Other fatigue: Secondary | ICD-10-CM

## 2013-10-29 DIAGNOSIS — E538 Deficiency of other specified B group vitamins: Secondary | ICD-10-CM

## 2013-10-29 DIAGNOSIS — N3000 Acute cystitis without hematuria: Secondary | ICD-10-CM

## 2013-10-29 DIAGNOSIS — R5381 Other malaise: Secondary | ICD-10-CM

## 2013-10-29 DIAGNOSIS — R42 Dizziness and giddiness: Secondary | ICD-10-CM

## 2013-10-29 LAB — CBC WITH DIFFERENTIAL/PLATELET
Basophils Absolute: 0 10*3/uL (ref 0.0–0.1)
Basophils Relative: 1 % (ref 0–1)
Eosinophils Absolute: 0 10*3/uL (ref 0.0–0.7)
Eosinophils Relative: 0 % (ref 0–5)
HCT: 43.3 % (ref 36.0–46.0)
HEMOGLOBIN: 15.3 g/dL — AB (ref 12.0–15.0)
LYMPHS ABS: 1.3 10*3/uL (ref 0.7–4.0)
LYMPHS PCT: 31 % (ref 12–46)
MCH: 31.4 pg (ref 26.0–34.0)
MCHC: 35.3 g/dL (ref 30.0–36.0)
MCV: 88.7 fL (ref 78.0–100.0)
MONOS PCT: 9 % (ref 3–12)
Monocytes Absolute: 0.4 10*3/uL (ref 0.1–1.0)
NEUTROS ABS: 2.4 10*3/uL (ref 1.7–7.7)
NEUTROS PCT: 59 % (ref 43–77)
Platelets: 196 10*3/uL (ref 150–400)
RBC: 4.88 MIL/uL (ref 3.87–5.11)
RDW: 12.8 % (ref 11.5–15.5)
WBC: 4.1 10*3/uL (ref 4.0–10.5)

## 2013-10-29 NOTE — Patient Instructions (Signed)

## 2013-10-29 NOTE — Progress Notes (Signed)
Subjective:     Shannon Nielsen is a 68 y.o. female with history of breast cancer s/p mastectomy, radiation, and chemo who presents for evaluation of dizziness. She has felt dizzy intermittently for 5 months but it is happening more frequently.  She states it is worse when she does not eat for a long time in the morning or at night, she states eating a mint makes her feel better.  She does have GERD symptoms. She states she does have some sinus congestion and have some changes in her vision due to glaucoma, last visit was 1 month ago. Denies double vision, speech changes, falls, imbalance, popping, clicking in her ears, She has also had some mild swelling in her feet, better with elevation for the same amount of time. Separately she notices a sharp pain in her left breast, she has seen her oncologist and had a negative evaluation for this with a breast ultrasound.  The following portions of the patient's history were reviewed and updated as appropriate: allergies, current medications, past family history, past medical history, past social history, past surgical history and problem list. Current Outpatient Prescriptions on File Prior to Visit  Medication Sig Dispense Refill  . aspirin 81 MG tablet Take 81 mg by mouth daily.        . bisoprolol-hydrochlorothiazide (ZIAC) 5-6.25 MG per tablet Take 1 tablet by mouth daily.  30 tablet  3  . Cholecalciferol (VITAMIN D-3) 5000 UNITS TABS Take 1 tablet by mouth daily.        . fluticasone (FLONASE) 50 MCG/ACT nasal spray Use 1 spray 2 times a day  16 g  PRN  . letrozole (FEMARA) 2.5 MG tablet Take 1 tablet (2.5 mg total) by mouth daily.  30 tablet  11  . quinapril (ACCUPRIL) 20 MG tablet Take 10 mg by mouth at bedtime.        . rosuvastatin (CRESTOR) 20 MG tablet Take 10 mg by mouth daily.         No current facility-administered medications on file prior to visit.   Normal CXR 08/2013 Review of Systems Pertinent items are noted in HPI.    Objective:     BP 128/78  Pulse 76  Temp(Src) 98.2 F (36.8 C)  Resp 16  Wt 144 lb (65.318 kg) General appearance: alert and no distress Head: Normocephalic, without obvious abnormality, atraumatic Eyes: conjunctivae/corneas clear. PERRL, EOM's intact. Fundi benign. Ears: normal TM's and external ear canals both ears Throat: lips, mucosa, and tongue normal; teeth and gums normal Neck: no adenopathy, no carotid bruit, no JVD, supple, symmetrical, trachea midline and thyroid not enlarged, symmetric, no tenderness/mass/nodules Lungs: clear to auscultation bilaterally Heart: regular rate and rhythm, S1, S2 normal, no murmur, click, rub or gallop Abdomen: normal findings: aorta normal, bowel sounds normal, no masses palpable and no organomegaly and abnormal findings:  guarding and mild tenderness in the epigastrium Extremities: extremities normal, atraumatic, no cyanosis or edema and no edema, redness or tenderness in the calves or thighs Pulses: 2+ and symmetric Neurologic: Mental status: Alert, oriented, thought content appropriate Cranial nerves: normal Motor: grossly normal Gait: Normal  Coordination- normal finger to nose, heel to shin, no pronator drift, slightly abnormal romberg, good heel to toe walking.       Assessment and Plan:   Dizziness/Fatigue- normal neuro exam- if any changes will get MRI or she will go to the ER Possible sleep apnea- get at home sleep test Rule out anemia, infection, dehydration, low blood sugar-  she was taught to take her sugars at home GERD- nexium samples given Edema- No edema currently, weight stable,  no SOB, PND, RRR without murmurs- instructed to elevate feet and if worse let us know  If symptoms worse go to the ER OVER 30 minutes of exam, counseling, chart review, referral performed

## 2013-10-30 LAB — URINALYSIS, ROUTINE W REFLEX MICROSCOPIC
Bilirubin Urine: NEGATIVE
Glucose, UA: NEGATIVE mg/dL
Hgb urine dipstick: NEGATIVE
KETONES UR: NEGATIVE mg/dL
Leukocytes, UA: NEGATIVE
NITRITE: NEGATIVE
PH: 6 (ref 5.0–8.0)
Protein, ur: NEGATIVE mg/dL
SPECIFIC GRAVITY, URINE: 1.008 (ref 1.005–1.030)
Urobilinogen, UA: 0.2 mg/dL (ref 0.0–1.0)

## 2013-10-30 LAB — BASIC METABOLIC PANEL WITH GFR
BUN: 16 mg/dL (ref 6–23)
CO2: 29 meq/L (ref 19–32)
Calcium: 10.9 mg/dL — ABNORMAL HIGH (ref 8.4–10.5)
Chloride: 102 mEq/L (ref 96–112)
Creat: 1.05 mg/dL (ref 0.50–1.10)
GFR, EST AFRICAN AMERICAN: 64 mL/min
GFR, Est Non African American: 55 mL/min — ABNORMAL LOW
Glucose, Bld: 88 mg/dL (ref 70–99)
POTASSIUM: 4.4 meq/L (ref 3.5–5.3)
SODIUM: 140 meq/L (ref 135–145)

## 2013-10-30 LAB — TSH: TSH: 0.469 u[IU]/mL (ref 0.350–4.500)

## 2013-10-30 LAB — HEPATIC FUNCTION PANEL
ALT: 12 U/L (ref 0–35)
AST: 21 U/L (ref 0–37)
Albumin: 4.5 g/dL (ref 3.5–5.2)
Alkaline Phosphatase: 121 U/L — ABNORMAL HIGH (ref 39–117)
BILIRUBIN DIRECT: 0.2 mg/dL (ref 0.0–0.3)
BILIRUBIN INDIRECT: 0.6 mg/dL (ref 0.2–1.2)
Total Bilirubin: 0.8 mg/dL (ref 0.2–1.2)
Total Protein: 7.4 g/dL (ref 6.0–8.3)

## 2013-10-30 LAB — VITAMIN B12: VITAMIN B 12: 327 pg/mL (ref 211–911)

## 2013-10-30 LAB — INSULIN, FASTING: INSULIN FASTING, SERUM: 31 u[IU]/mL — AB (ref 3–28)

## 2013-10-31 LAB — URINE CULTURE
COLONY COUNT: NO GROWTH
ORGANISM ID, BACTERIA: NO GROWTH

## 2013-11-17 ENCOUNTER — Telehealth: Payer: Self-pay | Admitting: *Deleted

## 2013-11-17 NOTE — Telephone Encounter (Signed)
Patient called to inform Dr Melford Aase that Dr Delman Cheadle referred her to Dr Zadie Rhine due to hole a in macula of her right eye.  Dr Melford Aase aware.

## 2013-11-25 ENCOUNTER — Telehealth: Payer: Self-pay | Admitting: Internal Medicine

## 2013-11-25 NOTE — Telephone Encounter (Signed)
RESCHEDULING APPT WITH [facility]  Thank you, [facility] [phone] Fax [phone]

## 2013-12-01 ENCOUNTER — Ambulatory Visit: Payer: Self-pay | Admitting: Physician Assistant

## 2013-12-21 ENCOUNTER — Ambulatory Visit (INDEPENDENT_AMBULATORY_CARE_PROVIDER_SITE_OTHER): Payer: 59 | Admitting: Physician Assistant

## 2013-12-21 ENCOUNTER — Encounter: Payer: Self-pay | Admitting: Physician Assistant

## 2013-12-21 VITALS — BP 120/70 | HR 72 | Temp 98.1°F | Resp 16 | Wt 147.0 lb

## 2013-12-21 DIAGNOSIS — R7309 Other abnormal glucose: Secondary | ICD-10-CM

## 2013-12-21 DIAGNOSIS — I10 Essential (primary) hypertension: Secondary | ICD-10-CM

## 2013-12-21 DIAGNOSIS — R7303 Prediabetes: Secondary | ICD-10-CM

## 2013-12-21 DIAGNOSIS — Z79899 Other long term (current) drug therapy: Secondary | ICD-10-CM

## 2013-12-21 DIAGNOSIS — E210 Primary hyperparathyroidism: Secondary | ICD-10-CM

## 2013-12-21 DIAGNOSIS — E559 Vitamin D deficiency, unspecified: Secondary | ICD-10-CM

## 2013-12-21 DIAGNOSIS — E785 Hyperlipidemia, unspecified: Secondary | ICD-10-CM

## 2013-12-21 LAB — CBC WITH DIFFERENTIAL/PLATELET
BASOS ABS: 0 10*3/uL (ref 0.0–0.1)
Basophils Relative: 0 % (ref 0–1)
Eosinophils Absolute: 0 10*3/uL (ref 0.0–0.7)
Eosinophils Relative: 1 % (ref 0–5)
HCT: 39.8 % (ref 36.0–46.0)
HEMOGLOBIN: 14.1 g/dL (ref 12.0–15.0)
Lymphocytes Relative: 43 % (ref 12–46)
Lymphs Abs: 1.5 10*3/uL (ref 0.7–4.0)
MCH: 30.6 pg (ref 26.0–34.0)
MCHC: 35.4 g/dL (ref 30.0–36.0)
MCV: 86.3 fL (ref 78.0–100.0)
Monocytes Absolute: 0.4 10*3/uL (ref 0.1–1.0)
Monocytes Relative: 10 % (ref 3–12)
NEUTROS ABS: 1.6 10*3/uL — AB (ref 1.7–7.7)
NEUTROS PCT: 46 % (ref 43–77)
PLATELETS: 182 10*3/uL (ref 150–400)
RBC: 4.61 MIL/uL (ref 3.87–5.11)
RDW: 12.7 % (ref 11.5–15.5)
WBC: 3.5 10*3/uL — ABNORMAL LOW (ref 4.0–10.5)

## 2013-12-21 NOTE — Progress Notes (Signed)
Assessment and Plan:  Hypertension: Continue medication, monitor blood pressure at home. Continue DASH diet. Cholesterol: Continue diet and exercise. Check cholesterol.  Pre-diabetes-Continue diet and exercise. Check A1C Vitamin D Def- check level and continue medications.  Left leg pain with decreased reflexes, Xray in 2013 shows worse left side- with recent eye surgery can not do PT at this time, rest, heat, and will discuss another visit B12 def- get on sublingual B12  Continue diet and meds as discussed. Further disposition pending results of labs.  HPI 68 y.o. female  presents for 3 month follow up with hypertension, hyperlipidemia, prediabetes and vitamin D. Her blood pressure has been controlled at home, today their BP is BP: 120/70 mmHg She does not workout. She denies chest pain, shortness of breath, dizziness.  She is on cholesterol medication and denies myalgias. Her cholesterol is at goal. The cholesterol last visit was:   Lab Results  Component Value Date   CHOL 170 08/30/2013   HDL 66 08/30/2013   LDLCALC 81 08/30/2013   TRIG 113 08/30/2013   CHOLHDL 2.6 08/30/2013   She has been working on diet and exercise for prediabetes, and denies polydipsia, polyuria and visual disturbances. Last A1C in the office was:  Lab Results  Component Value Date   HGBA1C 5.9* 08/30/2013   Patient is on Vitamin D supplement.   Lab Results  Component Value Date   VD25OH 67 08/30/2013     Had recent surgery for right eye macular hole with Dr. Zadie Rhine, will follow up in August and is not suppose to do any physical activity.   Current Medications:  Current Outpatient Prescriptions on File Prior to Visit  Medication Sig Dispense Refill  . aspirin 81 MG tablet Take 81 mg by mouth daily.        . bisoprolol-hydrochlorothiazide (ZIAC) 5-6.25 MG per tablet Take 1 tablet by mouth daily.  30 tablet  3  . Cholecalciferol (VITAMIN D-3) 5000 UNITS TABS Take 1 tablet by mouth daily.        . fluticasone  (FLONASE) 50 MCG/ACT nasal spray Use 1 spray 2 times a day  16 g  PRN  . latanoprost (XALATAN) 0.005 % ophthalmic solution Place 1 drop into both eyes at bedtime.      Marland Kitchen letrozole (FEMARA) 2.5 MG tablet Take 1 tablet (2.5 mg total) by mouth daily.  30 tablet  11  . quinapril (ACCUPRIL) 20 MG tablet Take 10 mg by mouth at bedtime.        . rosuvastatin (CRESTOR) 20 MG tablet Take 10 mg by mouth daily.         No current facility-administered medications on file prior to visit.   Medical History:  Past Medical History  Diagnosis Date  . Hypertension   . Chest tightness   . Palpitations   . Hyperlipidemia   . Prediabetes   . Vitamin D deficiency   . GERD (gastroesophageal reflux disease)   . Breast cancer    Allergies:  Allergies  Allergen Reactions  . Minocycline   . Prednisone Swelling  . Sulfa Antibiotics     Patient does not remember the type of reaction, happened years ago.  . Zithromax [Azithromycin] Swelling  . Penicillins Rash    Review of Systems: [X]  = complains of  [ ]  = denies  General: Fatigue [ ]  Fever [ ]  Chills [ ]  Weakness [ ]   Insomnia [ ]  Eyes: Redness [ ]  Blurred vision [ ]  Diplopia [ ]   ENT:  Congestion [ ]  Sinus Pain [ ]  Post Nasal Drip [ ]  Sore Throat [ ]  Earache [ ]   Cardiac: Chest pain/pressure [ ]  SOB [ ]  Orthopnea [ ]   Palpitations [ ]   Paroxysmal nocturnal dyspnea[ ]  Claudication [ ]  Edema [ ]   Pulmonary: Cough [ ]  Wheezing[ ]   SOB [ ]   Snoring [ ]   GI: Nausea [ ]  Vomiting[ ]  Dysphagia[ ]  Heartburn[ ]  Abdominal pain [ ]  Constipation [ ] ; Diarrhea [ ] ; BRBPR [ ]  Melena[ ]  GU: Hematuria[ ]  Dysuria [ ]  Nocturia[ ]  Urgency [ ]   Hesitancy [ ]  Discharge [ ]  Neuro: Headaches[ ]  Vertigo[ ]  Paresthesias[ ]  Spasm [ ]  Speech changes [ ]  Incoordination [ ]   Ortho: Arthritis [ ]  Joint pain [ ]  Muscle pain [ ]  Joint swelling [ ]  Back Pain [ ]  Skin:  Rash [ ]   Pruritis [ ]  Change in skin lesion [ ]   Psych: Depression[ ]  Anxiety[ ]  Confusion [ ]  Memory loss [ ]    Heme/Lypmh: Bleeding [ ]  Bruising [ ]  Enlarged lymph nodes [ ]   Endocrine: Visual blurring [ ]  Paresthesia [ ]  Polyuria [ ]  Polydypsea [ ]    Heat/cold intolerance [ ]  Hypoglycemia [ ]   Family history- Review and unchanged Social history- Review and unchanged Physical Exam: BP 120/70  Pulse 72  Temp(Src) 98.1 F (36.7 C)  Resp 16  Wt 147 lb (66.679 kg) Wt Readings from Last 3 Encounters:  12/21/13 147 lb (66.679 kg)  10/29/13 144 lb (65.318 kg)  10/18/13 148 lb 3.2 oz (67.223 kg)   General Appearance: Well nourished, in no apparent distress. Eyes: PERRLA, EOMs, conjunctiva no swelling or erythema Sinuses: No Frontal/maxillary tenderness ENT/Mouth: Ext aud canals clear, TMs without erythema, bulging. No erythema, swelling, or exudate on post pharynx.  Tonsils not swollen or erythematous. Hearing normal.  Neck: Supple, thyroid normal.  Respiratory: Respiratory effort normal, BS equal bilaterally without rales, rhonchi, wheezing or stridor.  Cardio: RRR with no MRGs. Brisk peripheral pulses without edema.  Abdomen: Soft, + BS.  Non tender, no guarding, rebound, hernias, masses. Lymphatics: Non tender without lymphadenopathy.  Musculoskeletal: Full ROM, 5/5 strength, normal gait. + SI left pain, with some decreased reflexes left leg only, negative straight leg raise.  Skin: Warm, dry without rashes, lesions, ecchymosis.  Neuro: Cranial nerves intact. Normal muscle tone, no cerebellar symptoms. Sensation intact.  Psych: Awake and oriented X 3, normal affect, Insight and Judgment appropriate.    Vicie Mutters 4:38 PM

## 2013-12-21 NOTE — Patient Instructions (Addendum)
B12 is low end of normal, add sublingual B12. The sublingual is better than the pills because likely you are not absorbing in your intestines well so the sublingual gets absorbed through your mouth and ensures you get the amount you need. Will help with energy, memory/concentration, decrease nerve pain, and help with weight loss. B12 is water soluble vitamin so you can not over dose on it, and anything you do not use will be sent out in your urine.      Bad carbs also include fruit juice, alcohol, and sweet tea. These are empty calories that do not signal to your brain that you are full.   Please remember the good carbs are still carbs which convert into sugar. So please measure them out no more than 1/2-1 cup of rice, oatmeal, pasta, and beans.  Veggies are however free foods! Pile them on.   I like lean protein at every meal such as chicken, [place], pork chops, cottage cheese, etc. Just do not fry these meats and please center your meal around vegetable, the meats should be a side dish.   No all fruit is created equal. Please see the list below, the fruit at the bottom is higher in sugars than the fruit at the top   Take the nexium/protonix for 2-4 more weeks once a day, then switch to pepcid or zantac (generic is fine) 2 x a day for 2 weeks, then once a day for 2 weeks and then stop. Avoid alcohol, spicy foods, NSAIDS (aleve, ibuprofen) at this time. See foods below.   Food Choices for Gastroesophageal Reflux Disease When you have gastroesophageal reflux disease (GERD), the foods you eat and your eating habits are very important. Choosing the right foods can help ease the discomfort of GERD. WHAT GENERAL GUIDELINES DO I NEED TO FOLLOW?  Choose fruits, vegetables, whole grains, low-fat dairy products, and low-fat meat, fish, and poultry.  Limit fats such as oils, salad dressings, butter, nuts, and avocado.  Keep a food diary to identify foods that cause symptoms.  Avoid foods that cause  reflux. These may be different for different people.  Eat frequent small meals instead of three large meals each day.  Eat your meals slowly, in a relaxed setting.  Limit fried foods.  Cook foods using methods other than frying.  Avoid drinking alcohol.  Avoid drinking large amounts of liquids with your meals.  Avoid bending over or lying down until 2-3 hours after eating. WHAT FOODS ARE NOT RECOMMENDED? The following are some foods and drinks that may worsen your symptoms: Vegetables Tomatoes. Tomato juice. Tomato and spaghetti sauce. Chili peppers. Onion and garlic. Horseradish. Fruits Oranges, grapefruit, and lemon (fruit and juice). Meats High-fat meats, fish, and poultry. This includes hot dogs, ribs, ham, sausage, salami, and bacon. Dairy Whole milk and chocolate milk. Sour cream. Cream. Butter. Ice cream. Cream cheese.  Beverages Coffee and tea, with or without caffeine. Carbonated beverages or energy drinks. Condiments Hot sauce. Barbecue sauce.  Sweets/Desserts Chocolate and cocoa. Donuts. Peppermint and spearmint. Fats and Oils High-fat foods, including [place] fries and potato chips. Other Vinegar. Strong spices, such as black pepper, white pepper, red pepper, cayenne, curry powder, cloves, ginger, and chili powder.

## 2013-12-22 LAB — BASIC METABOLIC PANEL WITH GFR
BUN: 13 mg/dL (ref 6–23)
CALCIUM: 10.3 mg/dL (ref 8.4–10.5)
CO2: 31 mEq/L (ref 19–32)
Chloride: 101 mEq/L (ref 96–112)
Creat: 0.99 mg/dL (ref 0.50–1.10)
GFR, EST AFRICAN AMERICAN: 69 mL/min
GFR, EST NON AFRICAN AMERICAN: 60 mL/min
Glucose, Bld: 99 mg/dL (ref 70–99)
POTASSIUM: 4.6 meq/L (ref 3.5–5.3)
Sodium: 139 mEq/L (ref 135–145)

## 2013-12-22 LAB — INSULIN, FASTING: Insulin fasting, serum: 21 u[IU]/mL (ref 3–28)

## 2013-12-22 LAB — LIPID PANEL
Cholesterol: 146 mg/dL (ref 0–200)
HDL: 59 mg/dL (ref >39)
LDL Cholesterol: 71 mg/dL (ref 0–99)
Total CHOL/HDL Ratio: 2.5 Ratio
Triglycerides: 79 mg/dL (ref <150)
VLDL: 16 mg/dL (ref 0–40)

## 2013-12-22 LAB — HEPATIC FUNCTION PANEL
ALT: 14 U/L (ref 0–35)
AST: 19 U/L (ref 0–37)
Albumin: 4.7 g/dL (ref 3.5–5.2)
Alkaline Phosphatase: 124 U/L — ABNORMAL HIGH (ref 39–117)
BILIRUBIN INDIRECT: 0.6 mg/dL (ref 0.2–1.2)
Bilirubin, Direct: 0.2 mg/dL (ref 0.0–0.3)
Total Bilirubin: 0.8 mg/dL (ref 0.2–1.2)
Total Protein: 7 g/dL (ref 6.0–8.3)

## 2013-12-22 LAB — HEMOGLOBIN A1C
Hgb A1c MFr Bld: 5.8 % — ABNORMAL HIGH (ref <5.7)
Mean Plasma Glucose: 120 mg/dL — ABNORMAL HIGH (ref <117)

## 2013-12-22 LAB — MAGNESIUM: Magnesium: 2.1 mg/dL (ref 1.5–2.5)

## 2013-12-22 LAB — VITAMIN D 25 HYDROXY (VIT D DEFICIENCY, FRACTURES): VIT D 25 HYDROXY: 74 ng/mL (ref 30–89)

## 2014-01-11 ENCOUNTER — Encounter (INDEPENDENT_AMBULATORY_CARE_PROVIDER_SITE_OTHER): Payer: Self-pay | Admitting: General Surgery

## 2014-01-21 ENCOUNTER — Other Ambulatory Visit: Payer: Self-pay | Admitting: Obstetrics and Gynecology

## 2014-01-24 LAB — CYTOLOGY - PAP

## 2014-02-06 ENCOUNTER — Other Ambulatory Visit: Payer: Self-pay | Admitting: Internal Medicine

## 2014-02-18 ENCOUNTER — Ambulatory Visit (INDEPENDENT_AMBULATORY_CARE_PROVIDER_SITE_OTHER): Payer: 59 | Admitting: General Surgery

## 2014-03-07 ENCOUNTER — Encounter: Payer: Self-pay | Admitting: Internal Medicine

## 2014-03-07 ENCOUNTER — Ambulatory Visit (INDEPENDENT_AMBULATORY_CARE_PROVIDER_SITE_OTHER): Payer: 59 | Admitting: Internal Medicine

## 2014-03-07 VITALS — BP 122/80 | HR 64 | Temp 97.9°F | Resp 16 | Ht 66.0 in | Wt 147.0 lb

## 2014-03-07 DIAGNOSIS — I10 Essential (primary) hypertension: Secondary | ICD-10-CM

## 2014-03-07 DIAGNOSIS — Z79899 Other long term (current) drug therapy: Secondary | ICD-10-CM

## 2014-03-07 DIAGNOSIS — Z0000 Encounter for general adult medical examination without abnormal findings: Secondary | ICD-10-CM

## 2014-03-07 DIAGNOSIS — R7309 Other abnormal glucose: Secondary | ICD-10-CM

## 2014-03-07 DIAGNOSIS — Z23 Encounter for immunization: Secondary | ICD-10-CM

## 2014-03-07 DIAGNOSIS — E782 Mixed hyperlipidemia: Secondary | ICD-10-CM

## 2014-03-07 DIAGNOSIS — R7303 Prediabetes: Secondary | ICD-10-CM

## 2014-03-07 DIAGNOSIS — E559 Vitamin D deficiency, unspecified: Secondary | ICD-10-CM

## 2014-03-07 LAB — CBC WITH DIFFERENTIAL/PLATELET
BASOS ABS: 0 10*3/uL (ref 0.0–0.1)
BASOS PCT: 0 % (ref 0–1)
EOS ABS: 0 10*3/uL (ref 0.0–0.7)
Eosinophils Relative: 1 % (ref 0–5)
HEMATOCRIT: 43.4 % (ref 36.0–46.0)
HEMOGLOBIN: 14.8 g/dL (ref 12.0–15.0)
Lymphocytes Relative: 39 % (ref 12–46)
Lymphs Abs: 1.4 10*3/uL (ref 0.7–4.0)
MCH: 30.6 pg (ref 26.0–34.0)
MCHC: 34.1 g/dL (ref 30.0–36.0)
MCV: 89.9 fL (ref 78.0–100.0)
MONO ABS: 0.4 10*3/uL (ref 0.1–1.0)
MONOS PCT: 10 % (ref 3–12)
Neutro Abs: 1.8 10*3/uL (ref 1.7–7.7)
Neutrophils Relative %: 50 % (ref 43–77)
Platelets: 178 10*3/uL (ref 150–400)
RBC: 4.83 MIL/uL (ref 3.87–5.11)
RDW: 12.9 % (ref 11.5–15.5)
WBC: 3.6 10*3/uL — ABNORMAL LOW (ref 4.0–10.5)

## 2014-03-07 NOTE — Progress Notes (Signed)
Patient ID: Shannon Nielsen, female   DOB: 1945/08/02, 68 y.o.   MRN: [id number]   This very nice 68 y.o.female presents for 3 month follow up with Hypertension, Hyperlipidemia, Pre-Diabetes and Vitamin D Deficiency.    Patient is treated for HTN & BP has been controlled at home. Today's BP: 122/80 mmHg. Patient had a negative Myoview in 2012. Patient has had no complaints of any cardiac type chest pain, palpitations, dyspnea/orthopnea/PND, dizziness, claudication, or dependent edema.  Hyperlipidemia is controlled with diet & meds. Patient denies myalgias or other med SE's. Last Lipids were Total  Chol, 146; HDL  59; LDL  71; Trig 79 on 12/21/2013.   Also, the patient has history of  PreDiabetes (A1c 5.9% in 2011) and has had no symptoms of reactive hypoglycemia, diabetic polys, paresthesias or visual blurring.  Last A1c was  5.8%  On 12/21/2013.    Further, the patient also has history of Vitamin D Deficiency (13 in 2008)and supplements vitamin D without any suspected side-effects. Last vitamin D was  74 on  12/21/2013   Medication List   aspirin 81 MG tablet  Take 81 mg by mouth daily.     bisoprolol-hydrochlorothiazide 5-6.25 MG per tablet  Commonly known as:  ZIAC  take 1 tablet by mouth once daily     fluticasone 50 MCG/ACT nasal spray  Commonly known as:  FLONASE  Use 1 spray 2 times a day     latanoprost 0.005 % ophthalmic solution  Commonly known as:  XALATAN  Place 1 drop into both eyes at bedtime.     letrozole 2.5 MG tablet  Commonly known as:  FEMARA  Take 1 tablet (2.5 mg total) by mouth daily.     OVER THE COUNTER MEDICATION  daily. B-Complex with 1000 mcg Biotin     quinapril 20 MG tablet  Commonly known as:  ACCUPRIL  Take 10 mg by mouth at bedtime.     rosuvastatin 20 MG tablet  Commonly known as:  CRESTOR  Take 10 mg by mouth daily.     Vitamin D-3 5000 UNITS Tabs  Take 1 tablet by mouth daily.     Allergies  Allergen Reactions  . Minocycline   .  Prednisone Swelling  . Sulfa Antibiotics     Patient does not remember the type of reaction, happened years ago.  . Zithromax [Azithromycin] Swelling  . Penicillins Rash   PMHx:   Past Medical History  Diagnosis Date  . Hypertension   . Chest tightness   . Palpitations   . Hyperlipidemia   . Prediabetes   . Vitamin D deficiency   . GERD (gastroesophageal reflux disease)    Hypercalcemia    . Breast cancer, Left (2011)    FHx:    Reviewed / unchanged SHx:    Reviewed / unchanged   Systems Review:  Constitutional: Denies fever, chills, wt changes, headaches, insomnia, fatigue, night sweats, change in appetite. Eyes: Denies redness, blurred vision, diplopia, discharge, itchy, watery eyes.  ENT: Denies discharge, congestion, post nasal drip, epistaxis, sore throat, earache, hearing loss, dental pain, tinnitus, vertigo, sinus pain, snoring.  CV: Denies chest pain, palpitations, irregular heartbeat, syncope, dyspnea, diaphoresis, orthopnea, PND, claudication or edema. Respiratory: denies cough, dyspnea, DOE, pleurisy, hoarseness, laryngitis, wheezing.  Gastrointestinal: Denies dysphagia, odynophagia, heartburn, reflux, water brash, abdominal pain or cramps, nausea, vomiting, bloating, diarrhea, constipation, hematemesis, melena, hematochezia  or hemorrhoids. Genitourinary: Denies dysuria, frequency, urgency, nocturia, hesitancy, discharge, hematuria or flank pain. Musculoskeletal: Denies arthralgias, myalgias,  stiffness, jt. swelling, pain, limping or strain/sprain.  Skin: Denies pruritus, rash, hives, warts, acne, eczema or change in skin lesion(s). Neuro: No weakness, tremor, incoordination, spasms, paresthesia or pain. Psychiatric: Denies confusion, memory loss or sensory loss. Endo: Denies change in weight, skin or hair change.  Heme/Lymph: No excessive bleeding, bruising or enlarged lymph nodes.  Exam:  BP 122/80  Pulse 64  Temp(Src) 97.9 F (36.6 C) (Temporal)  Resp 16   Ht 5\' 6"  (1.676 m)  Wt 147 lb (66.679 kg)  BMI 23.74 kg/m2  Appears well nourished and in no distress. Eyes: PERRLA, EOMs, conjunctiva no swelling or erythema. Sinuses: No frontal/maxillary tenderness ENT/Mouth: EAC's clear, TM's nl w/o erythema, bulging. Nares clear w/o erythema, swelling, exudates. Oropharynx clear without erythema or exudates. Oral hygiene is good. Tongue normal, non obstructing. Hearing intact.  Neck: Supple. Thyroid nl. Car 2+/2+ without bruits, nodes or JVD. Chest: Respirations nl with BS clear & equal w/o rales, rhonchi, wheezing or stridor.  Cor: Heart sounds normal w/ regular rate and rhythm without sig. murmurs, gallops, clicks, or rubs. Peripheral pulses normal and equal  without edema.   Lymphatics: Unremarkable.  Musculoskeletal: Full ROM all peripheral extremities, joint stability, 5/5 strength, and normal gait.  Skin: Warm, dry without exposed rashes, lesions or ecchymosis apparent.  Neuro: Cranial nerves intact, reflexes equal bilaterally. Sensory-motor testing grossly intact. Tendon reflexes grossly intact.  Pysch: Alert & oriented x 3.  Insight and judgement nl & appropriate. No ideations.  Assessment and Plan:  1. Hypertension - Continue monitor blood pressure at home. Continue diet/meds same.  2. Hyperlipidemia - Continue diet/meds, exercise,& lifestyle modifications. Continue monitor periodic cholesterol/liver & renal functions   3. Pre-Diabetes - Continue diet, exercise, lifestyle modifications. Monitor appropriate labs.  4. Vitamin D Deficiency - Continue supplementation.  Recommended regular exercise, BP monitoring, weight control, and discussed med and SE's. Recommended labs to assess and monitor clinical status. Further disposition pending results of labs. HD flu Vacc today and will return in 1 month for Prevnar.

## 2014-03-07 NOTE — Patient Instructions (Signed)

## 2014-03-08 LAB — BASIC METABOLIC PANEL WITH GFR
BUN: 15 mg/dL (ref 6–23)
CALCIUM: 10.6 mg/dL — AB (ref 8.4–10.5)
CO2: 29 mEq/L (ref 19–32)
CREATININE: 0.99 mg/dL (ref 0.50–1.10)
Chloride: 104 mEq/L (ref 96–112)
GFR, EST AFRICAN AMERICAN: 69 mL/min
GFR, EST NON AFRICAN AMERICAN: 60 mL/min
GLUCOSE: 89 mg/dL (ref 70–99)
Potassium: 4.5 mEq/L (ref 3.5–5.3)
Sodium: 140 mEq/L (ref 135–145)

## 2014-03-08 LAB — HEPATIC FUNCTION PANEL
ALBUMIN: 4.6 g/dL (ref 3.5–5.2)
ALT: 14 U/L (ref 0–35)
AST: 23 U/L (ref 0–37)
Alkaline Phosphatase: 114 U/L (ref 39–117)
Bilirubin, Direct: 0.2 mg/dL (ref 0.0–0.3)
Indirect Bilirubin: 0.6 mg/dL (ref 0.2–1.2)
TOTAL PROTEIN: 7 g/dL (ref 6.0–8.3)
Total Bilirubin: 0.8 mg/dL (ref 0.2–1.2)

## 2014-03-08 LAB — HEMOGLOBIN A1C
Hgb A1c MFr Bld: 6.1 % — ABNORMAL HIGH (ref <5.7)
MEAN PLASMA GLUCOSE: 128 mg/dL — AB (ref <117)

## 2014-03-08 LAB — TSH: TSH: 0.768 u[IU]/mL (ref 0.350–4.500)

## 2014-03-08 LAB — LIPID PANEL
CHOLESTEROL: 149 mg/dL (ref 0–200)
HDL: 63 mg/dL (ref >39)
LDL Cholesterol: 70 mg/dL (ref 0–99)
TRIGLYCERIDES: 81 mg/dL (ref <150)
Total CHOL/HDL Ratio: 2.4 Ratio
VLDL: 16 mg/dL (ref 0–40)

## 2014-03-08 LAB — VITAMIN D 25 HYDROXY (VIT D DEFICIENCY, FRACTURES): Vit D, 25-Hydroxy: 68 ng/mL (ref 30–89)

## 2014-03-08 LAB — MAGNESIUM: Magnesium: 2 mg/dL (ref 1.5–2.5)

## 2014-03-08 LAB — INSULIN, FASTING: INSULIN FASTING, SERUM: 11.1 u[IU]/mL (ref 2.0–19.6)

## 2014-03-24 ENCOUNTER — Ambulatory Visit (INDEPENDENT_AMBULATORY_CARE_PROVIDER_SITE_OTHER): Payer: 59 | Admitting: *Deleted

## 2014-03-24 DIAGNOSIS — Z23 Encounter for immunization: Secondary | ICD-10-CM

## 2014-03-30 ENCOUNTER — Other Ambulatory Visit: Payer: Self-pay | Admitting: Internal Medicine

## 2014-03-31 ENCOUNTER — Other Ambulatory Visit: Payer: Self-pay | Admitting: Internal Medicine

## 2014-05-31 ENCOUNTER — Encounter: Payer: Self-pay | Admitting: Internal Medicine

## 2014-06-07 ENCOUNTER — Telehealth: Payer: Self-pay | Admitting: Internal Medicine

## 2014-06-07 NOTE — Telephone Encounter (Signed)
Patient just had eye surg, trying to get a ride to our OV, will call back to confirm  Thank you, [facility] [phone] Fax [phone]

## 2014-06-08 ENCOUNTER — Ambulatory Visit: Payer: Self-pay | Admitting: Physician Assistant

## 2014-06-27 ENCOUNTER — Encounter: Payer: Self-pay | Admitting: Physician Assistant

## 2014-06-27 ENCOUNTER — Ambulatory Visit (INDEPENDENT_AMBULATORY_CARE_PROVIDER_SITE_OTHER): Payer: 59 | Admitting: Physician Assistant

## 2014-06-27 VITALS — BP 130/82 | HR 76 | Temp 97.7°F | Resp 16 | Ht 66.0 in | Wt 140.0 lb

## 2014-06-27 DIAGNOSIS — R7303 Prediabetes: Secondary | ICD-10-CM

## 2014-06-27 DIAGNOSIS — I10 Essential (primary) hypertension: Secondary | ICD-10-CM

## 2014-06-27 DIAGNOSIS — E559 Vitamin D deficiency, unspecified: Secondary | ICD-10-CM

## 2014-06-27 DIAGNOSIS — E782 Mixed hyperlipidemia: Secondary | ICD-10-CM

## 2014-06-27 DIAGNOSIS — Z79899 Other long term (current) drug therapy: Secondary | ICD-10-CM

## 2014-06-27 DIAGNOSIS — E210 Primary hyperparathyroidism: Secondary | ICD-10-CM

## 2014-06-27 DIAGNOSIS — R7309 Other abnormal glucose: Secondary | ICD-10-CM

## 2014-06-27 LAB — HEPATIC FUNCTION PANEL
ALK PHOS: 117 U/L (ref 39–117)
ALT: 15 U/L (ref 0–35)
AST: 24 U/L (ref 0–37)
Albumin: 4.7 g/dL (ref 3.5–5.2)
BILIRUBIN INDIRECT: 1.2 mg/dL (ref 0.2–1.2)
BILIRUBIN TOTAL: 1.4 mg/dL — AB (ref 0.2–1.2)
Bilirubin, Direct: 0.2 mg/dL (ref 0.0–0.3)
Total Protein: 7.9 g/dL (ref 6.0–8.3)

## 2014-06-27 LAB — MAGNESIUM: Magnesium: 2.2 mg/dL (ref 1.5–2.5)

## 2014-06-27 LAB — CBC WITH DIFFERENTIAL/PLATELET
BASOS ABS: 0 10*3/uL (ref 0.0–0.1)
Basophils Relative: 0 % (ref 0–1)
Eosinophils Absolute: 0 10*3/uL (ref 0.0–0.7)
Eosinophils Relative: 0 % (ref 0–5)
HCT: 42 % (ref 36.0–46.0)
Hemoglobin: 14.4 g/dL (ref 12.0–15.0)
Lymphocytes Relative: 27 % (ref 12–46)
Lymphs Abs: 1.5 10*3/uL (ref 0.7–4.0)
MCH: 30.9 pg (ref 26.0–34.0)
MCHC: 34.3 g/dL (ref 30.0–36.0)
MCV: 90.1 fL (ref 78.0–100.0)
MPV: 9 fL (ref 8.6–12.4)
Monocytes Absolute: 0.4 10*3/uL (ref 0.1–1.0)
Monocytes Relative: 8 % (ref 3–12)
Neutro Abs: 3.6 10*3/uL (ref 1.7–7.7)
Neutrophils Relative %: 65 % (ref 43–77)
PLATELETS: 193 10*3/uL (ref 150–400)
RBC: 4.66 MIL/uL (ref 3.87–5.11)
RDW: 13.4 % (ref 11.5–15.5)
WBC: 5.5 10*3/uL (ref 4.0–10.5)

## 2014-06-27 LAB — BASIC METABOLIC PANEL WITH GFR
BUN: 17 mg/dL (ref 6–23)
CALCIUM: 11 mg/dL — AB (ref 8.4–10.5)
CO2: 29 meq/L (ref 19–32)
Chloride: 102 mEq/L (ref 96–112)
Creat: 1.19 mg/dL — ABNORMAL HIGH (ref 0.50–1.10)
GFR, Est African American: 55 mL/min — ABNORMAL LOW
GFR, Est Non African American: 48 mL/min — ABNORMAL LOW
Glucose, Bld: 93 mg/dL (ref 70–99)
POTASSIUM: 4.3 meq/L (ref 3.5–5.3)
Sodium: 140 mEq/L (ref 135–145)

## 2014-06-27 LAB — LIPID PANEL
CHOLESTEROL: 167 mg/dL (ref 0–200)
HDL: 62 mg/dL (ref >39)
LDL Cholesterol: 90 mg/dL (ref 0–99)
Total CHOL/HDL Ratio: 2.7 Ratio
Triglycerides: 73 mg/dL (ref <150)
VLDL: 15 mg/dL (ref 0–40)

## 2014-06-27 LAB — HEMOGLOBIN A1C
Hgb A1c MFr Bld: 5.9 % — ABNORMAL HIGH (ref <5.7)
Mean Plasma Glucose: 123 mg/dL — ABNORMAL HIGH (ref <117)

## 2014-06-27 LAB — TSH: TSH: 0.646 u[IU]/mL (ref 0.350–4.500)

## 2014-06-27 NOTE — Progress Notes (Signed)
Assessment and Plan:  Hypertension: Continue medication, monitor blood pressure at home. Continue DASH diet.  Reminder to go to the ER if any CP, SOB, nausea, dizziness, severe HA, changes vision/speech, left arm numbness and tingling, and jaw pain. Cholesterol: Continue diet and exercise. Check cholesterol.  Pre-diabetes-Continue diet and exercise. Check A1C Vitamin D Def- check level and continue medications.  Seb cyst with scabs- vaseline, warm wet compresses, follow up. Anxiety/chest tightness- normal stress test 2012, no accompaniments- declines meds- stress management techniques discussed, increase water, good sleep hygiene discussed, increase exercise, and increase veggies.  Continue diet and meds as discussed. Further disposition pending results of labs.  HPI 69 y.o. female  presents for 3 month follow up with hypertension, hyperlipidemia, prediabetes and vitamin D. Her blood pressure has been controlled at home, today their BP is BP: 130/82 mmHg She does not workout. She denies chest pain, shortness of breath, dizziness.  She is on cholesterol medication, crestor 20mg  1/2 pill daily and denies myalgias. Her cholesterol is at goal. The cholesterol last visit was:   Lab Results  Component Value Date   CHOL 149 03/07/2014   HDL 63 03/07/2014   LDLCALC 70 03/07/2014   TRIG 81 03/07/2014   CHOLHDL 2.4 03/07/2014  She has been working on diet and exercise for prediabetes, she has lost 7 lbs since her last visit, and denies paresthesia of the feet, polydipsia, polyuria and visual disturbances. Last A1C in the office was:  Lab Results  Component Value Date   HGBA1C 6.1* 03/07/2014  Patient is on Vitamin D supplement.   Lab Results  Component Value Date   VD25OH 68 03/07/2014  Patient had eye surgery with Dr. Delman Cheadle and has been on several eye drops, currently just on Lotemax and will stop Jan 28th.   Had cyst on right posterior shoulder, squeezed it and it started to hurt.  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   Current Medications:  Current Outpatient Prescriptions on File Prior to Visit  Medication Sig Dispense Refill  . aspirin 81 MG tablet Take 81 mg by mouth daily.      . bisoprolol-hydrochlorothiazide (ZIAC) 5-6.25 MG per tablet take 1 tablet by mouth once daily 90 tablet 99  . Cholecalciferol (VITAMIN D-3) 5000 UNITS TABS Take 1 tablet by mouth daily.      . CRESTOR 20 MG tablet take 1 tablet by mouth at bedtime for cholesterol 30 tablet PRN  . fluticasone (FLONASE) 50 MCG/ACT nasal spray Use 1 spray 2 times a day 16 g PRN  . latanoprost (XALATAN) 0.005 % ophthalmic solution Place 1 drop into both eyes at bedtime.    Marland Kitchen letrozole (FEMARA) 2.5 MG tablet Take 1 tablet (2.5 mg total) by mouth daily. 30 tablet 11  . OVER THE COUNTER MEDICATION daily. B-Complex with 1000 mcg Biotin    . quinapril (ACCUPRIL) 20 MG tablet take 1 tablet by mouth once daily 30 tablet PRN   No current facility-administered medications on file prior to visit.   Medical History:  Past Medical History  Diagnosis Date  . Hypertension   . Chest tightness   . Palpitations   . Hyperlipidemia   . Prediabetes   . Vitamin D deficiency   . GERD (gastroesophageal reflux disease)   . Breast cancer    Allergies:  Allergies  Allergen Reactions  . Minocycline   . Prednisone Swelling  .  Sulfa Antibiotics     Patient does not remember the type of reaction, happened years ago.  . Zithromax [Azithromycin] Swelling  . Penicillins Rash    Review of Systems:  Review of Systems  Constitutional: Negative.   HENT: Negative.   Respiratory: Negative.   Cardiovascular: Positive for chest pain. Negative for palpitations, orthopnea, claudication, leg swelling and PND.  Gastrointestinal: Negative.   Musculoskeletal: Negative.   Skin: Positive for rash. Negative for itching.   Neurological: Negative for dizziness, tingling, tremors, speech change, focal weakness, seizures and loss of consciousness. Sensory change:  neuropathy.  Psychiatric/Behavioral: Negative.     Family history- Review and unchanged Social history- Review and unchanged Physical Exam: BP 130/82 mmHg  Pulse 76  Temp(Src) 97.7 F (36.5 C)  Resp 16  Ht 5\' 6"  (1.676 m)  Wt 140 lb (63.504 kg)  BMI 22.61 kg/m2 Wt Readings from Last 3 Encounters:  06/27/14 140 lb (63.504 kg)  03/07/14 147 lb (66.679 kg)  12/21/13 147 lb (66.679 kg)   General Appearance: Well nourished, in no apparent distress. Eyes: PERRLA, EOMs, conjunctiva no swelling or erythema Sinuses: No Frontal/maxillary tenderness ENT/Mouth: Ext aud canals clear, TMs without erythema, bulging. No erythema, swelling, or exudate on post pharynx.  Tonsils not swollen or erythematous. Hearing normal.  Neck: Supple, thyroid normal.  Respiratory: Respiratory effort normal, BS equal bilaterally without rales, rhonchi, wheezing or stridor.  Cardio: RRR with no MRGs. Brisk peripheral pulses without edema.  Abdomen: Soft, + BS.  Non tender, no guarding, rebound, hernias, masses. Lymphatics: Non tender without lymphadenopathy.  Musculoskeletal: Full ROM, 5/5 strength, normal gait.  Skin: Warm, dry without rashes, lesions, ecchymosis. Right pos shoulder with scabbing nodule, no erythema, warmth, tenderness.  Neuro: Cranial nerves intact. Normal muscle tone, no cerebellar symptoms. Sensation intact.  Psych: Awake and oriented X 3, normal affect, Insight and Judgment appropriate.    Vicie Mutters, PA-C 12:21 PM [facility]

## 2014-06-27 NOTE — Patient Instructions (Addendum)
For your back do neosporin for 2-3 days and then vaseline, can do warm wet compresses, if not better/improving in 2 weeks make follow up appointment.   Can start zantac 150-300 mg at night for 2 weeks, then you can stop.  Avoid alcohol, spicy foods, NSAIDS (aleve, ibuprofen) at this time. See foods below.   Food Choices for Gastroesophageal Reflux Disease When you have gastroesophageal reflux disease (GERD), the foods you eat and your eating habits are very important. Choosing the right foods can help ease the discomfort of GERD. WHAT GENERAL GUIDELINES DO I NEED TO FOLLOW?  Choose fruits, vegetables, whole grains, low-fat dairy products, and low-fat meat, fish, and poultry.  Limit fats such as oils, salad dressings, butter, nuts, and avocado.  Keep a food diary to identify foods that cause symptoms.  Avoid foods that cause reflux. These may be different for different people.  Eat frequent small meals instead of three large meals each day.  Eat your meals slowly, in a relaxed setting.  Limit fried foods.  Cook foods using methods other than frying.  Avoid drinking alcohol.  Avoid drinking large amounts of liquids with your meals.  Avoid bending over or lying down until 2-3 hours after eating. WHAT FOODS ARE NOT RECOMMENDED? The following are some foods and drinks that may worsen your symptoms: Vegetables Tomatoes. Tomato juice. Tomato and spaghetti sauce. Chili peppers. Onion and garlic. Horseradish. Fruits Oranges, grapefruit, and lemon (fruit and juice). Meats High-fat meats, fish, and poultry. This includes hot dogs, ribs, ham, sausage, salami, and bacon. Dairy Whole milk and chocolate milk. Sour cream. Cream. Butter. Ice cream. Cream cheese.  Beverages Coffee and tea, with or without caffeine. Carbonated beverages or energy drinks. Condiments Hot sauce. Barbecue sauce.  Sweets/Desserts Chocolate and cocoa. Donuts. Peppermint and spearmint. Fats and Oils High-fat  foods, including [place] fries and potato chips. Other Vinegar. Strong spices, such as black pepper, white pepper, red pepper, cayenne, curry powder, cloves, ginger, and chili powder. Stress and Stress Management Stress is a normal reaction to life events. It is what you feel when life demands more than you are used to or more than you can handle. Some stress can be useful. For example, the stress reaction can help you catch the last bus of the day, study for a test, or meet a deadline at work. But stress that occurs too often or for too long can cause problems. It can affect your emotional health and interfere with relationships and normal daily activities. Too much stress can weaken your immune system and increase your risk for physical illness. If you already have a medical problem, stress can make it worse. CAUSES  All sorts of life events may cause stress. An event that causes stress for one person may not be stressful for another person. Major life events commonly cause stress. These may be positive or negative. Examples include losing your job, moving into a new home, getting married, having a baby, or losing a loved one. Less obvious life events may also cause stress, especially if they occur day after day or in combination. Examples include working long hours, driving in traffic, caring for children, being in debt, or being in a difficult relationship. SIGNS AND SYMPTOMS Stress may cause emotional symptoms including, the following:  Anxiety. This is feeling worried, afraid, on edge, overwhelmed, or out of control.  Anger. This is feeling irritated or impatient.  Depression. This is feeling sad, down, helpless, or guilty.  Difficulty focusing, remembering, or making  decisions. Stress may cause physical symptoms, including the following:   Aches and pains. These may affect your head, neck, back, stomach, or other areas of your body.  Tight muscles or clenched jaw.  Low energy or trouble  sleeping. Stress may cause unhealthy behaviors, including the following:   Eating to feel better (overeating) or skipping meals.  Sleeping too little, too much, or both.  Working too much or putting off tasks (procrastination).  Smoking, drinking alcohol, or using drugs to feel better. DIAGNOSIS  Stress is diagnosed through an assessment by your health care provider. Your health care provider will ask questions about your symptoms and any stressful life events.Your health care provider will also ask about your medical history and may order blood tests or other tests. Certain medical conditions and medicine can cause physical symptoms similar to stress. Mental illness can cause emotional symptoms and unhealthy behaviors similar to stress. Your health care provider may refer you to a mental health professional for further evaluation.  TREATMENT  Stress management is the recommended treatment for stress.The goals of stress management are reducing stressful life events and coping with stress in healthy ways.  Techniques for reducing stressful life events include the following:  Stress identification. Self-monitor for stress and identify what causes stress for you. These skills may help you to avoid some stressful events.  Time management. Set your priorities, keep a calendar of events, and learn to say "no." These tools can help you avoid making too many commitments. Techniques for coping with stress include the following:  Rethinking the problem. Try to think realistically about stressful events rather than ignoring them or overreacting. Try to find the positives in a stressful situation rather than focusing on the negatives.  Exercise. Physical exercise can release both physical and emotional tension. The key is to find a form of exercise you enjoy and do it regularly.  Relaxation techniques. These relax the body and mind. Examples include yoga, meditation, tai chi, biofeedback, deep  breathing, progressive muscle relaxation, listening to music, being out in nature, journaling, and other hobbies. Again, the key is to find one or more that you enjoy and can do regularly.  Healthy lifestyle. Eat a balanced diet, get plenty of sleep, and do not smoke. Avoid using alcohol or drugs to relax.  Strong support network. Spend time with family, friends, or other people you enjoy being around.Express your feelings and talk things over with someone you trust. Counseling or talktherapy with a mental health professional may be helpful if you are having difficulty managing stress on your own. Medicine is typically not recommended for the treatment of stress.Talk to your health care provider if you think you need medicine for symptoms of stress. HOME CARE INSTRUCTIONS  Keep all follow-up visits as directed by your health care provider.  Take all medicines as directed by your health care provider. SEEK MEDICAL CARE IF:  Your symptoms get worse or you start having new symptoms.  You feel overwhelmed by your problems and can no longer manage them on your own. SEEK IMMEDIATE MEDICAL CARE IF:  You feel like hurting yourself or someone else. Document Released: 11/20/2000 Document Revised: 10/11/2013 Document Reviewed: 01/19/2013 [facility] Patient Information 2015 [place], [place]. This information is not intended to replace advice given to you by your health care provider. Make sure you discuss any questions you have with your health care provider.

## 2014-08-10 ENCOUNTER — Ambulatory Visit (INDEPENDENT_AMBULATORY_CARE_PROVIDER_SITE_OTHER): Payer: 59 | Admitting: *Deleted

## 2014-08-10 VITALS — BP 148/82 | HR 64 | Temp 97.3°F | Resp 16 | Wt 137.6 lb

## 2014-08-10 DIAGNOSIS — R6889 Other general symptoms and signs: Secondary | ICD-10-CM

## 2014-08-10 NOTE — Progress Notes (Signed)
Patient ID: Shannon Nielsen, female   DOB: 1946-01-12, 69 y.o.   MRN: [id number] Patient here today to recheck abnormal kidney function.

## 2014-08-11 ENCOUNTER — Telehealth: Payer: Self-pay | Admitting: *Deleted

## 2014-08-11 LAB — BASIC METABOLIC PANEL WITH GFR
BUN: 16 mg/dL (ref 6–23)
CALCIUM: 10.5 mg/dL (ref 8.4–10.5)
CO2: 27 meq/L (ref 19–32)
Chloride: 101 mEq/L (ref 96–112)
Creat: 1.05 mg/dL (ref 0.50–1.10)
GFR, EST NON AFRICAN AMERICAN: 55 mL/min — AB
GFR, Est African American: 64 mL/min
Glucose, Bld: 100 mg/dL — ABNORMAL HIGH (ref 70–99)
Potassium: 4.2 mEq/L (ref 3.5–5.3)
SODIUM: 136 meq/L (ref 135–145)

## 2014-08-11 NOTE — Telephone Encounter (Signed)
Per Dr Melford Aase, patient should increase her Quinapril 20 mg from 1/2 tab to 1 tab daily and monitor BP.  Pt aware of lab results

## 2014-08-24 ENCOUNTER — Telehealth: Payer: Self-pay | Admitting: Oncology

## 2014-08-24 NOTE — Telephone Encounter (Signed)
pt cld & left a voicemail wanting next appt-cld & left pt a message to adv of next sch appt time & date

## 2014-08-31 ENCOUNTER — Encounter: Payer: Self-pay | Admitting: Internal Medicine

## 2014-10-04 ENCOUNTER — Other Ambulatory Visit: Payer: Self-pay

## 2014-10-04 ENCOUNTER — Encounter: Payer: Self-pay | Admitting: Internal Medicine

## 2014-10-04 ENCOUNTER — Ambulatory Visit (INDEPENDENT_AMBULATORY_CARE_PROVIDER_SITE_OTHER): Payer: 59 | Admitting: Internal Medicine

## 2014-10-04 ENCOUNTER — Telehealth: Payer: Self-pay

## 2014-10-04 VITALS — BP 142/86 | HR 80 | Temp 97.5°F | Resp 16 | Ht 66.5 in | Wt 132.6 lb

## 2014-10-04 DIAGNOSIS — K219 Gastro-esophageal reflux disease without esophagitis: Secondary | ICD-10-CM

## 2014-10-04 DIAGNOSIS — I10 Essential (primary) hypertension: Secondary | ICD-10-CM

## 2014-10-04 DIAGNOSIS — R5383 Other fatigue: Secondary | ICD-10-CM

## 2014-10-04 DIAGNOSIS — Z111 Encounter for screening for respiratory tuberculosis: Secondary | ICD-10-CM

## 2014-10-04 DIAGNOSIS — R7303 Prediabetes: Secondary | ICD-10-CM

## 2014-10-04 DIAGNOSIS — R7309 Other abnormal glucose: Secondary | ICD-10-CM

## 2014-10-04 DIAGNOSIS — Z79899 Other long term (current) drug therapy: Secondary | ICD-10-CM

## 2014-10-04 DIAGNOSIS — C50919 Malignant neoplasm of unspecified site of unspecified female breast: Secondary | ICD-10-CM

## 2014-10-04 DIAGNOSIS — E782 Mixed hyperlipidemia: Secondary | ICD-10-CM

## 2014-10-04 DIAGNOSIS — E559 Vitamin D deficiency, unspecified: Secondary | ICD-10-CM

## 2014-10-04 DIAGNOSIS — Z1212 Encounter for screening for malignant neoplasm of rectum: Secondary | ICD-10-CM

## 2014-10-04 LAB — CBC WITH DIFFERENTIAL/PLATELET
BASOS ABS: 0 10*3/uL (ref 0.0–0.1)
BASOS PCT: 0 % (ref 0–1)
Eosinophils Absolute: 0.1 10*3/uL (ref 0.0–0.7)
Eosinophils Relative: 1 % (ref 0–5)
HEMATOCRIT: 44.5 % (ref 36.0–46.0)
Hemoglobin: 15 g/dL (ref 12.0–15.0)
Lymphocytes Relative: 21 % (ref 12–46)
Lymphs Abs: 1.2 10*3/uL (ref 0.7–4.0)
MCH: 31 pg (ref 26.0–34.0)
MCHC: 33.7 g/dL (ref 30.0–36.0)
MCV: 91.9 fL (ref 78.0–100.0)
MONO ABS: 0.4 10*3/uL (ref 0.1–1.0)
MPV: 9.1 fL (ref 8.6–12.4)
Monocytes Relative: 7 % (ref 3–12)
Neutro Abs: 4.1 10*3/uL (ref 1.7–7.7)
Neutrophils Relative %: 71 % (ref 43–77)
Platelets: 210 10*3/uL (ref 150–400)
RBC: 4.84 MIL/uL (ref 3.87–5.11)
RDW: 13.1 % (ref 11.5–15.5)
WBC: 5.8 10*3/uL (ref 4.0–10.5)

## 2014-10-04 LAB — BASIC METABOLIC PANEL WITH GFR
BUN: 14 mg/dL (ref 6–23)
CHLORIDE: 104 meq/L (ref 96–112)
CO2: 28 mEq/L (ref 19–32)
Calcium: 10.7 mg/dL — ABNORMAL HIGH (ref 8.4–10.5)
Creat: 0.97 mg/dL (ref 0.50–1.10)
GFR, EST AFRICAN AMERICAN: 70 mL/min
GFR, EST NON AFRICAN AMERICAN: 61 mL/min
GLUCOSE: 104 mg/dL — AB (ref 70–99)
Potassium: 4.2 mEq/L (ref 3.5–5.3)
Sodium: 140 mEq/L (ref 135–145)

## 2014-10-04 LAB — LIPID PANEL
Cholesterol: 181 mg/dL (ref 0–200)
HDL: 71 mg/dL (ref >=46)
LDL CALC: 96 mg/dL (ref 0–99)
TRIGLYCERIDES: 71 mg/dL (ref <150)
Total CHOL/HDL Ratio: 2.5 Ratio
VLDL: 14 mg/dL (ref 0–40)

## 2014-10-04 LAB — IRON AND TIBC
%SAT: 20 % (ref 20–55)
Iron: 90 ug/dL (ref 42–145)
TIBC: 453 ug/dL (ref 250–470)
UIBC: 363 ug/dL (ref 125–400)

## 2014-10-04 LAB — HEMOGLOBIN A1C
Hgb A1c MFr Bld: 6.2 % — ABNORMAL HIGH (ref <5.7)
Mean Plasma Glucose: 131 mg/dL — ABNORMAL HIGH (ref <117)

## 2014-10-04 LAB — HEPATIC FUNCTION PANEL
ALBUMIN: 4.6 g/dL (ref 3.5–5.2)
ALT: 18 U/L (ref 0–35)
AST: 24 U/L (ref 0–37)
Alkaline Phosphatase: 108 U/L (ref 39–117)
Bilirubin, Direct: 0.2 mg/dL (ref 0.0–0.3)
Indirect Bilirubin: 0.7 mg/dL (ref 0.2–1.2)
TOTAL PROTEIN: 7.7 g/dL (ref 6.0–8.3)
Total Bilirubin: 0.9 mg/dL (ref 0.2–1.2)

## 2014-10-04 LAB — MAGNESIUM: MAGNESIUM: 2.1 mg/dL (ref 1.5–2.5)

## 2014-10-04 LAB — TSH: TSH: 0.946 u[IU]/mL (ref 0.350–4.500)

## 2014-10-04 LAB — VITAMIN B12: VITAMIN B 12: 826 pg/mL (ref 211–911)

## 2014-10-04 NOTE — Telephone Encounter (Signed)
Mailed Hemoccult cards to patient. Patient is advised to return hemoccults when complete.

## 2014-10-04 NOTE — Progress Notes (Signed)
Patient ID: Shannon Nielsen, female   DOB: January 12, 1946, 69 y.o.   MRN: [id number]  Annual Comprehensive Examination  This very nice 69 y.o. MWF presents for complete physical.  Patient has been followed for HTN, T2_NIDDM  Prediabetes, Hyperlipidemia, and Vitamin D Deficiency.    HTN predates since 28. Patient's BP has been controlled at home and patient denies any cardiac symptoms as chest pain, palpitations, shortness of breath, dizziness or ankle swelling. Today's BP was elevated at 142/86 and was rechecked at 149/89.    Patient underwent bilat mastectomies in Aug 2011 to Breast Cancer (left) and continues on Femera f/u by Dr Jana Hakim.     Patient's hyperlipidemia is controlled with diet and medications. Patient denies myalgias or other medication SE's. Last lipids were at goal - Total Chol 167; HDL 62; LDL  90; Trig 73 on 06/27/2014.   Patient has prediabetes predating since 2011 with A1c 5.9% and patient denies reactive hypoglycemic symptoms, visual blurring, diabetic polys, or paresthesias. Last A1c was 5.9% on 06/27/2014.       Patient is also being followed for occult hyperparathyroidism, hx/o osteopenia and sestamibi scan was negative in Mar 2014.   Finally, patient has history of Vitamin D Deficiency of 13 in 2008 and last Vitamin D was 68 on 03/07/2014.    Medication Sig  . aspirin 81 MG tablet Take 81 mg by mouth daily.    . bisoprolol-hydrochlorothiazide (ZIAC) 5-6.25 MG per tablet take 1 tablet by mouth once daily  . VITAMIN D 5000 UNITS TABS Take 1 tablet by mouth daily.    . CRESTOR 20 MG tablet take 1 tablet by mouth at bedtime for cholesterol  . Dorzolamide HCl-Timolol Mal PF 22.3-6.8 MG/ML SOLN Apply 1 drop to eye 2 (two) times daily. Left eye  . fluticasone (FLONASE)  nas spray Use 1 spray 2 times a day  . latanoprost (XALATAN) 0.005 % ophthalmic solution Place 1 drop into both eyes at bedtime.  Marland Kitchen letrozole (FEMARA) 2.5 MG tablet Take 1 tablet (2.5 mg total) by mouth daily.  Marland Kitchen  loteprednol (LOTEMAX) 0.2 % SUSP 1 drop 4 (four) times daily. Left eye  . B-Complex with 1000 mcg Biotin daily.   . quinapril  20 MG tablet take 1 tablet by mouth once daily   Allergies  Allergen Reactions  . Minocycline   . Prednisone Swelling  . Sulfa Antibiotics     Patient does not remember the type of reaction, happened years ago.  . Zithromax [Azithromycin] Swelling  . Penicillins Rash   Past Medical History  Diagnosis Date  . Hypertension   . Chest tightness   . Palpitations   . Hyperlipidemia   . Prediabetes   . Vitamin D deficiency   . GERD (gastroesophageal reflux disease)   . Breast cancer    Health Maintenance  Topic Date Due  . MAMMOGRAM  08/29/2011  . INFLUENZA VACCINE  01/09/2015  . PNA vac Low Risk Adult (2 of 2 - PPSV23) 03/25/2015  . TETANUS/TDAP  08/28/2022  . COLONOSCOPY  03/30/2023  . DEXA SCAN  Completed  . ZOSTAVAX  Completed   Immunization History  Administered Date(s) Administered  . Influenza, High Dose Seasonal PF 03/07/2014  . PPD Test 08/30/2013  . Pneumococcal Conjugate-13 03/24/2014  . Pneumococcal Polysaccharide-23 09/08/2006  . Tdap 08/27/2012  . Zoster 09/03/2012   Past Surgical History  Procedure Laterality Date  . Mastectomy  August 2011    Bilateral  . Parathyroidectomy    . Nasal sinus  surgery     Family History  Problem Relation Age of Onset  . Diabetes Mother   . Cancer Mother     breast   History  Substance Use Topics  . Smoking status: Never Smoker   . Smokeless tobacco: Never Used  . Alcohol Use: No    ROS Constitutional: Denies fever, chills, weight loss/gain, headaches, insomnia,  night sweats, and change in appetite. Does c/o fatigue. Eyes: Denies redness, blurred vision, diplopia, discharge, itchy, watery eyes.  ENT: Denies discharge, congestion, post nasal drip, epistaxis, sore throat, earache, hearing loss, dental pain, Tinnitus, Vertigo, Sinus pain, snoring.  Cardio: Denies chest pain, palpitations,  irregular heartbeat, syncope, dyspnea, diaphoresis, orthopnea, PND, claudication, edema Respiratory: denies cough, dyspnea, DOE, pleurisy, hoarseness, laryngitis, wheezing.  Gastrointestinal: Denies dysphagia, heartburn, reflux, water brash, pain, cramps, nausea, vomiting, bloating, diarrhea, constipation, hematemesis, melena, hematochezia, jaundice, hemorrhoids Genitourinary: Denies dysuria, frequency, urgency, nocturia, hesitancy, discharge, hematuria, flank pain Breast: Breast lumps, nipple discharge, bleeding.  Musculoskeletal: Denies arthralgia, myalgia, stiffness, Jt. Swelling, pain, limp, and strain/sprain. Denies falls. Skin: Denies puritis, rash, hives, warts, acne, eczema, changing in skin lesion Neuro: No weakness, tremor, incoordination, spasms, paresthesia, pain Psychiatric: Denies confusion, memory loss, sensory loss. Denies Depression. Endocrine: Denies change in weight, skin, hair change, nocturia, and paresthesia, diabetic polys, visual blurring, hyper / hypo glycemic episodes.  Heme/Lymph: No excessive bleeding, bruising, enlarged lymph nodes.  Physical Exam  BP 142/86  Pulse 80  Temp 97.5 F   Resp 16  Ht 5' 6.5"   Wt 132 lb 9.6 oz     BMI 21.08   General Appearance: Well nourished and in no apparent distress. Eyes: PERRLA, EOMs, conjunctiva no swelling or erythema, normal fundi and vessels. Sinuses: No frontal/maxillary tenderness ENT/Mouth: EACs patent / TMs  nl. Nares clear without erythema, swelling, mucoid exudates. Oral hygiene is good. No erythema, swelling, or exudate. Tongue normal, non-obstructing. Tonsils not swollen or erythematous. Hearing normal.  Neck: Supple, thyroid normal. No bruits, nodes or JVD. Respiratory: Respiratory effort normal.  BS equal and clear bilateral without rales, rhonci, wheezing or stridor. Cardio: Heart sounds are normal with regular rate and rhythm and no murmurs, rubs or gallops. Peripheral pulses are normal and equal bilaterally  without edema. No aortic or femoral bruits. Chest: symmetric with normal excursions and percussion. Breasts: Surgically absent bilat w/o signs of local recurrence.  Abdomen: Flat, soft, with bowl sounds. Nontender, no guarding, rebound, hernias, masses, or organomegaly.  Lymphatics: Non tender without lymphadenopathy.   Musculoskeletal: Full ROM all peripheral extremities, joint stability, 5/5 strength, and normal gait. Skin: Warm and dry without rashes, lesions, cyanosis, clubbing or  ecchymosis.  Neuro: Cranial nerves intact, reflexes equal bilaterally. Normal muscle tone, no cerebellar symptoms. Sensation intact.  Pysch: Awake and oriented X 3, normal affect, Insight and Judgment appropriate.   Assessment and Plan  1. Essential hypertension  - Microalbumin / creatinine urine ratio - EKG 12-Lead - [place], RETROPERITNL ABD,  LTD  2. Hyperlipidemia  - Lipid panel  3. Prediabetes  - Hemoglobin A1c - Insulin, random  4. Vitamin D deficiency  - Vit D  25 hydroxy (rtn osteoporosis monitoring)  5. Gastroesophageal reflux disease, esophagitis presence not specified   6. Breast cancer, unspecified laterality   7. Screening for rectal cancer  - POC Hemoccult Bld/Stl (3-Cd Home Screen); Future  8. Other fatigue  - Vitamin B12 - Iron and TIBC - TSH  9. Medication management  - Urine Microscopic - CBC with Differential/Platelet - BASIC  METABOLIC PANEL WITH GFR - Hepatic function panel - Magnesium   Continue prudent diet as discussed, weight control, BP monitoring, regular exercise, and medications. Discussed med's effects and SE's. Screening labs and tests as requested with regular follow-up as recommended. Over 40 minutes of exam, counseling, chart review was performed.

## 2014-10-04 NOTE — Progress Notes (Signed)
Mailed Hemoccult cards to patient.

## 2014-10-04 NOTE — Patient Instructions (Signed)
+++++++++++++++++++  Recommend Low dose or baby Aspirin 81 mg daily   To reduce risk of Colon Cancer 20 %, Skin Cancer 26 % , Melanoma 46% and   Pancreatic cancer 60%  ++++++++++++++++++++ Vitamin D goal is between 70-100.   Please make sure that you are taking your Vitamin D as directed.   It is very important as a natural antiinflammatory   helping hair, skin, and nails, as well as reducing stroke and heart attack risk.   It helps your bones and helps with mood.  It also decreases numerous cancer risks so please take it as directed.   Low Vit D is associated with a 200-300% higher risk for CANCER   and 200-300% higher risk for HEART   ATTACK  &  STROKE.    ................................................  It is also associated with higher death rate at younger ages,   autoimmune diseases like Rheumatoid arthritis, Lupus, Multiple Sclerosis.     Also many other serious conditions, like depression, Alzheimer's  Dementia, infertility, muscle aches, fatigue, fibromyalgia - just to name a few.  +++++++++++++++ Recommend the book "The END of DIETING" by Dr Joel Fuhrman   & the book "The END of DIABETES " by Dr Joel Fuhrman  At [employer] - get book & Audio CD's     Being diabetic has a  300% increased risk for heart attack, stroke, cancer, and alzheimer- type vascular dementia. It is very important that you work harder with diet by avoiding all foods that are white. Avoid white rice (brown & wild rice is OK), white potatoes (sweetpotatoes in moderation is OK), White bread or wheat bread or anything made out of white flour like bagels, donuts, rolls, buns, biscuits, cakes, pastries, cookies, pizza crust, and pasta (made from white flour & egg whites) - vegetarian pasta or spinach or wheat pasta is OK. Multigrain breads like Arnold's or [employer], or multigrain sandwich thins or flatbreads.  Diet, exercise and weight loss can reverse and cure diabetes in the early stages.   Diet, exercise and weight loss is very important in the control and prevention of complications of diabetes which affects every system in your body, ie. Brain - dementia/stroke, eyes - glaucoma/blindness, heart - heart attack/heart failure, kidneys - dialysis, stomach - gastric paralysis, intestines - malabsorption, nerves - severe painful neuritis, circulation - gangrene & loss of a leg(s), and finally cancer and Alzheimers.    I recommend avoid fried & greasy foods,  sweets/candy, white rice (brown or wild rice or Quinoa is OK), white potatoes (sweet potatoes are OK) - anything made from white flour - bagels, doughnuts, rolls, buns, biscuits,white and wheat breads, pizza crust and traditional pasta made of white flour & egg white(vegetarian pasta or spinach or wheat pasta is OK).  Multi-grain bread is OK - like multi-grain flat bread or sandwich thins. Avoid alcohol in excess. Exercise is also important.    Eat all the vegetables you want - avoid meat, especially red meat and dairy - especially cheese.  Cheese is the most concentrated form of trans-fats which is the worst thing to clog up our arteries. Veggie cheese is OK which can be found in the fresh produce section at [employer] or [employer] or [employer]  ++++++++++++++++++++++++++++++++++ Preventive Care for Adults  A healthy lifestyle and preventive care can promote health and wellness. Preventive health guidelines for women include the following key practices.  A routine yearly physical is a good way to check with your health care provider about   your health and preventive screening. It is a chance to share any concerns and updates on your health and to receive a thorough exam.  Visit your dentist for a routine exam and preventive care every 6 months. Brush your teeth twice a day and floss once a day. Good oral hygiene prevents tooth decay and gum disease.  The frequency of eye exams is based on your age, health, family medical history,  use of contact lenses, and other factors. Follow your health care provider's recommendations for frequency of eye exams.  Eat a healthy diet. Foods like vegetables, fruits, whole grains, low-fat dairy products, and lean protein foods contain the nutrients you need without too many calories. Decrease your intake of foods high in solid fats, added sugars, and salt. Eat the right amount of calories for you.Get information about a proper diet from your health care provider, if necessary.  Regular physical exercise is one of the most important things you can do for your health. Most adults should get at least 150 minutes of moderate-intensity exercise (any activity that increases your heart rate and causes you to sweat) each week. In addition, most adults need muscle-strengthening exercises on 2 or more days a week.  Maintain a healthy weight. The body mass index (BMI) is a screening tool to identify possible weight problems. It provides an estimate of body fat based on height and weight. Your health care provider can find your BMI and can help you achieve or maintain a healthy weight.For adults 20 years and older:  A BMI below 18.5 is considered underweight.  A BMI of 18.5 to 24.9 is normal.  A BMI of 25 to 29.9 is considered overweight.  A BMI of 30 and above is considered obese.  Maintain normal blood lipids and cholesterol levels by exercising and minimizing your intake of saturated fat. Eat a balanced diet with plenty of fruit and vegetables. If your lipid or cholesterol levels are high, you are over 50, or you are at high risk for heart disease, you may need your cholesterol levels checked more frequently.Ongoing high lipid and cholesterol levels should be treated with medicines if diet and exercise are not working.  If you smoke, find out from your health care provider how to quit. If you do not use tobacco, do not start.  Lung cancer screening is recommended for adults aged 55-80 years who  are at high risk for developing lung cancer because of a history of smoking. A yearly low-dose CT scan of the lungs is recommended for people who have at least a 30-pack-year history of smoking and are a current smoker or have quit within the past 15 years. A pack year of smoking is smoking an average of 1 pack of cigarettes a day for 1 year (for example: 1 pack a day for 30 years or 2 packs a day for 15 years). Yearly screening should continue until the smoker has stopped smoking for at least 15 years. Yearly screening should be stopped for people who develop a health problem that would prevent them from having lung cancer treatment.  Avoid use of street drugs. Do not share needles with anyone. Ask for help if you need support or instructions about stopping the use of drugs.  High blood pressure causes heart disease and increases the risk of stroke.  Ongoing high blood pressure should be treated with medicines if weight loss and exercise do not work.  If you are 55-79 years old, ask your health care provider if   you should take aspirin to prevent strokes.  Diabetes screening involves taking a blood sample to check your fasting blood sugar level. This should be done once every 3 years, after age 45, if you are within normal weight and without risk factors for diabetes. Testing should be considered at a younger age or be carried out more frequently if you are overweight and have at least 1 risk factor for diabetes.  Breast cancer screening is essential preventive care for women. You should practice "breast self-awareness." This means understanding the normal appearance and feel of your breasts and may include breast self-examination. Any changes detected, no matter how small, should be reported to a health care provider. Women in their 20s and 30s should have a clinical breast exam (CBE) by a health care provider as part of a regular health exam every 1 to 3 years. After age 40, women should have a CBE every  year. Starting at age 40, women should consider having a mammogram (breast X-ray test) every year. Women who have a family history of breast cancer should talk to their health care provider about genetic screening. Women at a high risk of breast cancer should talk to their health care providers about having an MRI and a mammogram every year.  Breast cancer gene (BRCA)-related cancer risk assessment is recommended for women who have family members with BRCA-related cancers. BRCA-related cancers include breast, ovarian, tubal, and peritoneal cancers. Having family members with these cancers may be associated with an increased risk for harmful changes (mutations) in the breast cancer genes BRCA1 and BRCA2. Results of the assessment will determine the need for genetic counseling and BRCA1 and BRCA2 testing.  Routine pelvic exams to screen for cancer are no longer recommended for nonpregnant women who are considered low risk for cancer of the pelvic organs (ovaries, uterus, and vagina) and who do not have symptoms. Ask your health care provider if a screening pelvic exam is right for you.  If you have had past treatment for cervical cancer or a condition that could lead to cancer, you need Pap tests and screening for cancer for at least 20 years after your treatment. If Pap tests have been discontinued, your risk factors (such as having a new sexual partner) need to be reassessed to determine if screening should be resumed. Some women have medical problems that increase the chance of getting cervical cancer. In these cases, your health care provider may recommend more frequent screening and Pap tests.    Colorectal cancer can be detected and often prevented. Most routine colorectal cancer screening begins at the age of 50 years and continues through age 75 years. However, your health care provider may recommend screening at an earlier age if you have risk factors for colon cancer. On a yearly basis, your health  care provider may provide home test kits to check for hidden blood in the stool. Use of a small camera at the end of a tube, to directly examine the colon (sigmoidoscopy or colonoscopy), can detect the earliest forms of colorectal cancer. Talk to your health care provider about this at age 50, when routine screening begins. Direct exam of the colon should be repeated every 5-10 years through age 75 years, unless early forms of pre-cancerous polyps or small growths are found.  Osteoporosis is a disease in which the bones lose minerals and strength with aging. This can result in serious bone fractures or breaks. The risk of osteoporosis can be identified using a bone density scan.   Women ages 65 years and over and women at risk for fractures or osteoporosis should discuss screening with their health care providers. Ask your health care provider whether you should take a calcium supplement or vitamin D to reduce the rate of osteoporosis.  Menopause can be associated with physical symptoms and risks. Hormone replacement therapy is available to decrease symptoms and risks. You should talk to your health care provider about whether hormone replacement therapy is right for you.  Use sunscreen. Apply sunscreen liberally and repeatedly throughout the day. You should seek shade when your shadow is shorter than you. Protect yourself by wearing long sleeves, pants, a wide-brimmed hat, and sunglasses year round, whenever you are outdoors.  Once a month, do a whole body skin exam, using a mirror to look at the skin on your back. Tell your health care provider of new moles, moles that have irregular borders, moles that are larger than a pencil eraser, or moles that have changed in shape or color.  Stay current with required vaccines (immunizations).  Influenza vaccine. All adults should be immunized every year.  Tetanus, diphtheria, and acellular pertussis (Td, Tdap) vaccine. Pregnant women should receive 1 dose of  Tdap vaccine during each pregnancy. The dose should be obtained regardless of the length of time since the last dose. Immunization is preferred during the 27th-36th week of gestation. An adult who has not previously received Tdap or who does not know her vaccine status should receive 1 dose of Tdap. This initial dose should be followed by tetanus and diphtheria toxoids (Td) booster doses every 10 years. Adults with an unknown or incomplete history of completing a 3-dose immunization series with Td-containing vaccines should begin or complete a primary immunization series including a Tdap dose. Adults should receive a Td booster every 10 years.    Zoster vaccine. One dose is recommended for adults aged 60 years or older unless certain conditions are present.    Pneumococcal 13-valent conjugate (PCV13) vaccine. When indicated, a person who is uncertain of her immunization history and has no record of immunization should receive the PCV13 vaccine. An adult aged 19 years or older who has certain medical conditions and has not been previously immunized should receive 1 dose of PCV13 vaccine. This PCV13 should be followed with a dose of pneumococcal polysaccharide (PPSV23) vaccine. The PPSV23 vaccine dose should be obtained at least 8 weeks after the dose of PCV13 vaccine. An adult aged 19 years or older who has certain medical conditions and previously received 1 or more doses of PPSV23 vaccine should receive 1 dose of PCV13. The PCV13 vaccine dose should be obtained 1 or more years after the last PPSV23 vaccine dose.    Pneumococcal polysaccharide (PPSV23) vaccine. When PCV13 is also indicated, PCV13 should be obtained first. All adults aged 65 years and older should be immunized. An adult younger than age 65 years who has certain medical conditions should be immunized. Any person who resides in a nursing home or long-term care facility should be immunized. An adult smoker should be immunized. People with an  immunocompromised condition and certain other conditions should receive both PCV13 and PPSV23 vaccines. People with human immunodeficiency virus (HIV) infection should be immunized as soon as possible after diagnosis. Immunization during chemotherapy or radiation therapy should be avoided. Routine use of PPSV23 vaccine is not recommended for American Indians, [place] Natives, or people younger than 65 years unless there are medical conditions that require PPSV23 vaccine. When indicated, people who have unknown   immunization and have no record of immunization should receive PPSV23 vaccine. One-time revaccination 5 years after the first dose of PPSV23 is recommended for people aged 19-64 years who have chronic kidney failure, nephrotic syndrome, asplenia, or immunocompromised conditions. People who received 1-2 doses of PPSV23 before age 65 years should receive another dose of PPSV23 vaccine at age 65 years or later if at least 5 years have passed since the previous dose. Doses of PPSV23 are not needed for people immunized with PPSV23 at or after age 65 years.   Preventive Services / Frequency  Ages 65 years and over  Blood pressure check.  Lipid and cholesterol check.  Lung cancer screening. / Every year if you are aged 55-80 years and have a 30-pack-year history of smoking and currently smoke or have quit within the past 15 years. Yearly screening is stopped once you have quit smoking for at least 15 years or develop a health problem that would prevent you from having lung cancer treatment.  Clinical breast exam.** / Every year after age 40 years.  BRCA-related cancer risk assessment.** / For women who have family members with a BRCA-related cancer (breast, ovarian, tubal, or peritoneal cancers).  Mammogram.** / Every year beginning at age 40 years and continuing for as long as you are in good health. Consult with your health care provider.  Pap test.** / Every 3 years starting at age 30 years  through age 65 or 70 years with 3 consecutive normal Pap tests. Testing can be stopped between 65 and 70 years with 3 consecutive normal Pap tests and no abnormal Pap or HPV tests in the past 10 years.  Fecal occult blood test (FOBT) of stool. / Every year beginning at age 50 years and continuing until age 75 years. You may not need to do this test if you get a colonoscopy every 10 years.  Flexible sigmoidoscopy or colonoscopy.** / Every 5 years for a flexible sigmoidoscopy or every 10 years for a colonoscopy beginning at age 50 years and continuing until age 75 years.  Hepatitis C blood test.** / For all people born from 1945 through 1965 and any individual with known risks for hepatitis C.  Osteoporosis screening.** / A one-time screening for women ages 65 years and over and women at risk for fractures or osteoporosis.  Skin self-exam. / Monthly.  Influenza vaccine. / Every year.  Tetanus, diphtheria, and acellular pertussis (Tdap/Td) vaccine.** / 1 dose of Td every 10 years.  Zoster vaccine.** / 1 dose for adults aged 60 years or older.  Pneumococcal 13-valent conjugate (PCV13) vaccine.** / Consult your health care provider.  Pneumococcal polysaccharide (PPSV23) vaccine.** / 1 dose for all adults aged 65 years and older. Screening for abdominal aortic aneurysm (AAA)  by ultrasound is recommended for people who have history of high blood pressure or who are current or former smokers. 

## 2014-10-05 ENCOUNTER — Encounter: Payer: Self-pay | Admitting: Internal Medicine

## 2014-10-05 LAB — URINALYSIS, MICROSCOPIC ONLY
BACTERIA UA: NONE SEEN
CASTS: NONE SEEN
Crystals: NONE SEEN
SQUAMOUS EPITHELIAL / LPF: NONE SEEN

## 2014-10-05 LAB — MICROALBUMIN / CREATININE URINE RATIO
Creatinine, Urine: 230 mg/dL
MICROALB UR: 1 mg/dL (ref <2.0)
MICROALB/CREAT RATIO: 4.3 mg/g (ref 0.0–30.0)

## 2014-10-05 LAB — INSULIN, RANDOM: Insulin: 6.1 u[IU]/mL (ref 2.0–19.6)

## 2014-10-05 LAB — VITAMIN D 25 HYDROXY (VIT D DEFICIENCY, FRACTURES): Vit D, 25-Hydroxy: 48 ng/mL (ref 30–100)

## 2014-10-05 NOTE — Addendum Note (Signed)
Addended by: Melbourne Abts C on: 10/05/2014 09:42 AM   Modules accepted: Orders

## 2014-10-06 ENCOUNTER — Other Ambulatory Visit: Payer: Self-pay | Admitting: Internal Medicine

## 2014-10-06 DIAGNOSIS — I10 Essential (primary) hypertension: Secondary | ICD-10-CM

## 2014-10-06 LAB — TB SKIN TEST
Induration: 0 mm
TB Skin Test: NEGATIVE

## 2014-10-06 MED ORDER — BISOPROLOL-HYDROCHLOROTHIAZIDE 10-6.25 MG PO TABS: ORAL_TABLET | ORAL | Status: DC

## 2014-10-24 ENCOUNTER — Other Ambulatory Visit: Payer: Self-pay | Admitting: *Deleted

## 2014-10-24 MED ORDER — FLUTICASONE PROPIONATE 50 MCG/ACT NA SUSP: NASAL | Status: DC

## 2014-10-25 ENCOUNTER — Telehealth: Payer: Self-pay | Admitting: Oncology

## 2014-10-25 ENCOUNTER — Ambulatory Visit (HOSPITAL_BASED_OUTPATIENT_CLINIC_OR_DEPARTMENT_OTHER): Payer: 59 | Admitting: Oncology

## 2014-10-25 VITALS — BP 149/85 | HR 79 | Temp 97.4°F | Resp 18 | Ht 66.5 in | Wt 135.5 lb

## 2014-10-25 DIAGNOSIS — C773 Secondary and unspecified malignant neoplasm of axilla and upper limb lymph nodes: Secondary | ICD-10-CM

## 2014-10-25 DIAGNOSIS — Z803 Family history of malignant neoplasm of breast: Secondary | ICD-10-CM

## 2014-10-25 DIAGNOSIS — C50919 Malignant neoplasm of unspecified site of unspecified female breast: Secondary | ICD-10-CM

## 2014-10-25 DIAGNOSIS — C50912 Malignant neoplasm of unspecified site of left female breast: Secondary | ICD-10-CM

## 2014-10-25 DIAGNOSIS — I890 Lymphedema, not elsewhere classified: Secondary | ICD-10-CM

## 2014-10-25 DIAGNOSIS — C50212 Malignant neoplasm of upper-inner quadrant of left female breast: Secondary | ICD-10-CM

## 2014-10-25 MED ORDER — LETROZOLE 2.5 MG PO TABS: 2.5000 mg | ORAL_TABLET | Freq: Every day | ORAL | Status: DC

## 2014-10-25 NOTE — Progress Notes (Signed)
. [name]   DOB: 1946-05-16  MR#: [id number]  GYK#:[id number]  PCP: Shannon Richards, MD GYN: Shannon Nielsen OTHER MD: Shannon Nielsen   Chief complaint: Estrogen receptor positive breast cancer  Current treatment: Letrozole   HISTORY OF PRESENT ILLNESS: As per previously documented note:  Shannon Nielsen had screening mammography October 03, 2008 which was unremarkable.  Towards the end of that year or early the next year, she noticed a little bit of discomfort and itching in both breasts, particularly around the nipple.  She brought this to Shannon Nielsen attention and was referred for a diagnostic mammography on August 28, 2009.  On exam, Shannon Nielsen found diffuse firmness of the entire left breast with skin thickening.  By mammography, the left breast showed increased trabecular thickening and an ill-defined mass with developing calcifications in the upper outer quadrant.  The ultrasound on the same day showed in the left breast an irregular hypoechoic mass measuring 2.0 cm.  In the axilla on the left side there was at least one enlarged lymph node which measured 2 cm.  Biopsy was performed the same day of both the breast and the axilla and this showed ([id number]), an invasive ductal carcinoma, high grade.  The lymph node was also involved.  The tumor was estrogen receptor positive at 84%, progesterone receptor positive at 71% with an elevated MIB-1 of 56%.  There was no amplification of HER2/neu with a ratio of 1.32 by [facility].  Bilateral breast MRIs were obtained on March 24.  This showed a symmetric edema throughout the left breast with skin thickening, most prominently anteriorly near the nipple.  Otherwise non-mass like enhancement involving the majority of the left breast including all four quadrants, in the upper inner left breast, there was an ill defined more mass like area which measured up to 2.5 cm, corresponding to the abnormality seen by ultrasound.  In total the  abnormal area of enhancement measuring 7.3 cm in largest dimension.  There was also abnormal enhancement of the thickened skin in the anterior left breast.  The pectoralis muscle did not appear to be involved.  On the right side there were some cysts and in the axilla there was one enlarged left axillary lymph node.  In addition, an incidental finding in the right lobe of the liver was a T2 bright lesion measuring approximately 1 cm.  Her subsequent history is as detailed below. . INTERVAL HISTORY: Shannon Nielsen returns today for follow-up of her breast cancer. The interim history is stable. She continues on letrozole with good tolerance. She sees her primary care physician frequently for evaluation of prediabetes and other problems.  REVIEW OF SYSTEMS: She has lost a little weight, which concerns her. She has some seasonal sinus problems. Sometimes she has pain in the middle of the chest. Some of this is related to her peripheral neuropathy. Both flareup at about the same time. She bruises easily. She gets occasional headaches. She gets anxious but not depressed. She has moderate hot flashes. She had right cataract surgery and she had a macular hole repaired by Shannon Nielsen. A detailed review of systems today was otherwise non-contributory  PAST MEDICAL HISTORY: Past Medical History  Diagnosis Date  . Hypertension   . Chest tightness   . Palpitations   . Hyperlipidemia   . Prediabetes   . Vitamin D deficiency   . GERD (gastroesophageal reflux disease)   . Breast cancer   history of chronic sinusitis, history of hemorrhoids, history  of bilateral tubal ligation,  history of prior multiple breast biopsies.  PAST Shannon HISTORY: Past Shannon History  Procedure Laterality Date  . Mastectomy  August 2011    Bilateral  . Parathyroidectomy    . Nasal sinus surgery      FAMILY HISTORY Family History  Problem Relation Age of Onset  . Diabetes Mother   . Cancer Mother     breast  The patient's  father died at the age of 75 from complications of atherosclerotic coronary vascular disease.  The patient's mother is alive at 16 and the patient is engaged on a daily basis in her care, although the patient's mother does have a four-hour nurse who comes daily, and a Careers adviser who stays with her at night.  The patient's mother had breast cancer diagnosed at age 70.  The patient has no sisters.  She has two brothers, one of them has sickle cell trait, the other one has a history of prostate cancer.  The only breast cancers in the family that she is aware of are two cousins on the father's side, one of whom may have been diagnosed before the age of 29.  There was also a maternal aunt who may have been diagnosed with ovarian cancer at the age of 5, but this is not clear.  GYNECOLOGIC HISTORY: She is Gx, P1, first pregnancy to term age 51.  She went through the change of life at 76.  She did not take hormone replacement therapy.  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  Her husband of >20 years, Shannon Nielsen, retired from [employer] and currently works as a Paediatric nurse (commutes).  I should add that I saw Shannon Nielsen remotely for what proved to be a history of glomerulonephritis.  Shannon Nielsen's first wife was my patient, Shannon Nielsen, and Shannon Nielsen and Shannon Nielsen, Shannon Nielsen, is Shannon Nielsen's stepdaughter.  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  Shannon Nielsen is Shannon Nielsen; he works in Insurance underwriter and part time as a Chief Operating Officer.  Shannon Nielsen.  She is a Horticulturist, commercial in place  HEALTH MAINTENANCE: History  Substance Use Topics  . Smoking status: Never Smoker   . Smokeless tobacco: Never Used  . Alcohol Use: No     Colonoscopy:  PAP:  Bone density: Done  8/6/ 2014  Lipid panel:  UTD, Dr. Melford Aase  Allergies  Allergen Reactions  . Minocycline   . Prednisone Swelling  . Sulfa Antibiotics     Patient does not  remember the type of reaction, happened years ago.  . Zithromax [Azithromycin] Swelling  . Penicillins Rash    Current Outpatient Prescriptions  Medication Sig Dispense Refill  . aspirin 81 MG tablet Take 81 mg by mouth daily.      . bisoprolol-hydrochlorothiazide (ZIAC) 10-6.25 MG per tablet Take 1 tablet daily for BP 90 tablet 99  . Cholecalciferol (VITAMIN D-3) 5000 UNITS TABS Take 1 tablet by mouth daily.      . CRESTOR 20 MG tablet take 1 tablet by mouth at bedtime for cholesterol 30 tablet PRN  . Dorzolamide HCl-Timolol Mal PF 22.3-6.8 MG/ML SOLN Apply 1 drop to eye 2 (two) times daily. Left eye    . dorzolamide-timolol (COSOPT) 22.3-6.8 MG/ML ophthalmic solution   0  . fluticasone (FLONASE) 50 MCG/ACT nasal spray Use 1 spray 2 times a day 16 g PRN  . latanoprost (XALATAN) 0.005 % ophthalmic solution Place 1  drop into both eyes at bedtime.    Marland Kitchen letrozole (FEMARA) 2.5 MG tablet Take 1 tablet (2.5 mg total) by mouth daily. 30 tablet 11  . loteprednol (LOTEMAX) 0.2 % SUSP 1 drop 4 (four) times daily. Left eye    . OVER THE COUNTER MEDICATION daily. B-Complex with 1000 mcg Biotin    . quinapril (ACCUPRIL) 20 MG tablet take 1 tablet by mouth once daily 30 tablet PRN   No current facility-administered medications for this visit.    OBJECTIVE: middle-aged African American woman who appears well Filed Vitals:   10/25/14 1418  BP: 149/85  Pulse: 79  Temp: 97.4 F (36.3 C)  Resp: 18     Body mass index is 21.55 kg/(m^2).    ECOG FS: 0 Filed Weights   10/25/14 1418  Weight: 135 lb 8 oz (61.462 kg)   Sclerae unicteric, pupils round and equal Oropharynx clear and moist No cervical or supraclavicular adenopathy Lungs no rales or rhonchi Heart regular rate and rhythm Abd soft, nontender, positive bowel sounds MSK no focal spinal tenderness, no upper extremity lymphedema Neuro: nonfocal, well oriented, appropriate affect Breasts: Status post bilateral mastectomies. I do not palpate  any abnormality or find any abnormality by inspection in the middle of the chest area with the patient sometimes feels a little fullness superficially. There is no evidence of local recurrence. Both axillae are benign.   LAB RESULTS: Outside labs reviewed  Lab Results  Component Value Date   WBC 5.8 10/04/2014   NEUTROABS 4.1 10/04/2014   HGB 15.0 10/04/2014   HCT 44.5 10/04/2014   MCV 91.9 10/04/2014   PLT 210 10/04/2014      Chemistry      Component Value Date/Time   NA 140 10/04/2014 1123   NA 140 08/18/2013 1352   NA 138 12/16/2011 1410   K 4.2 10/04/2014 1123   K 4.1 08/18/2013 1352   K 4.0 12/16/2011 1410   CL 104 10/04/2014 1123   CL 104 07/28/2012 1437   CL 100 12/16/2011 1410   CO2 28 10/04/2014 1123   CO2 29 08/18/2013 1352   CO2 31 12/16/2011 1410   BUN 14 10/04/2014 1123   BUN 12.4 08/18/2013 1352   BUN 14 12/16/2011 1410   CREATININE 0.97 10/04/2014 1123   CREATININE 1.1 08/18/2013 1352   CREATININE 1.10 09/29/2012 1139      Component Value Date/Time   CALCIUM 10.7* 10/04/2014 1123   CALCIUM 10.7* 08/18/2013 1352   CALCIUM 10.8* 07/28/2012 1618   CALCIUM 9.7 12/16/2011 1410   ALKPHOS 108 10/04/2014 1123   ALKPHOS 127 08/18/2013 1352   ALKPHOS 117* 12/16/2011 1410   AST 24 10/04/2014 1123   AST 20 08/18/2013 1352   AST 23 12/16/2011 1410   ALT 18 10/04/2014 1123   ALT 12 08/18/2013 1352   ALT 18 12/16/2011 1410   BILITOT 0.9 10/04/2014 1123   BILITOT 0.92 08/18/2013 1352   BILITOT 1.10 12/16/2011 1410       Lab Results  Component Value Date   LABCA2 17 07/01/2011    STUDIES: .No results found.  ASSESSMENT: 69 y.o. BRCA 1/2 negative [place] woman   (1) status post left breast biopsy in March 2011 for a high-grade invasive ductal carcinoma, with biopsy-proven axillary lymph node involvement at presentation. ER/PR positive, HER2 negative, with MIB-1 of 56%.   (2) received neoadjuvant chemotherapy with docetaxel, doxorubicin and  cyclophosphamide x4, discontinued due to peripheral neuropathy.  (3) received 1 cycle of carboplatin  and gemcitabine, also stopped due to worsening neuropathy.   (4) status post bilateral mastectomies and left axillary lymph node dissection in August 2011, the result showing a complete pathologic response in both the breast and the lymph nodes  (5) completed radiation therapy November 2011  (6) started letrozole November 2011; normal bone density 01/13/2013  (7) elevated PTH and calcium but no localizable adenoma, with history of remote parathyroidectomy  (8) bilateral solitary thyroid nodules, status post right thyroid nodule biopsy April 2014 consistent with benign goiter.  (9) left-sided chest wall lymphedema   PLAN:  Shannon Nielsen is doing great as far as breast cancer is concerned. Were going to continue letrozole another 6 months and then when she sees me at the next visit likely she will "graduate" from follow-up here.  Even though she underwent partial parathyroidectomy she still has calcium running on the high side. She might benefit from rechecking a PTH level. She tells me she would prefer to have that done through her primary care physician whom she sees more frequently because of all her other medical problems.  She remains concerned about multiple minor symptoms which she fears may indicate recurrent breast cancer. If she wishes we will offer her the opportunity of continuing yearly follow-up here through our survivorship clinic.  Shannon Nielsen has a good understanding of the overall plan. She agrees with it. She will call with any problems that may develop before next visit here.    Chauncey Cruel, MD    10/25/2014.

## 2014-10-25 NOTE — Telephone Encounter (Signed)
Gave avs & calendar for November.  °

## 2014-11-13 IMAGING — NM NM PARATHYROID W/ SPECT
1 series · 6 of 6 positions shown · non-contrast
Comparison: Chest CT 12/16/2011.

CLINICAL DATA: Hyperparathyroidism.  Serum PTH 75.4.

NM PARATHYROID SCINTIGRAPHY AND SPECT IMAGING
TECHNIQUE: Following intravenous administration of
radiopharmaceutical, early and 2-hour delayed planar images were
obtained in the anterior projection.  Delayed triplanar SPECT
images were also obtained at 2 hours.
Radiopharmaceutical: 24.9  mCi Lc-WWm Sestamibi IV

[Series 1: pa parathyroid · 4.75mm/px · 6 of 128 frames shown]
[frame 11/128]
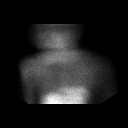
[frame 32/128]
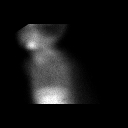
[frame 54/128]
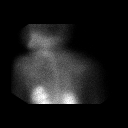
[frame 75/128]
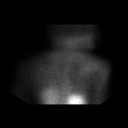
[frame 96/128]
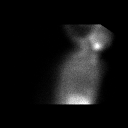
[frame 118/128]
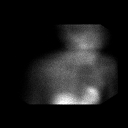

[6 of 6 positions shown; findings below may reference images not displayed]

FINDINGS: Initial planar images demonstrate fairly symmetric
thyroid activity bilaterally.  There is incomplete washout of the
thyroid activity on the delayed planar images, although no residual
focal activity is seen within the neck or superior mediastinum.

Delayed SPECT imaging demonstrates no focal peritracheal activity
to suggest the presence of a parathyroid adenoma.  There is no
abnormal focal activity elsewhere in the neck or superior
mediastinum.  Prior CT does demonstrate surgical clips posterior to
the right thyroid lobe.
IMPRESSION: No evidence of parathyroid adenoma within the neck or superior
mediastinum.

## 2014-12-20 ENCOUNTER — Encounter: Payer: Self-pay | Admitting: Genetic Counselor

## 2015-01-05 ENCOUNTER — Ambulatory Visit: Payer: Self-pay | Admitting: Internal Medicine

## 2015-01-12 ENCOUNTER — Encounter: Payer: Self-pay | Admitting: Internal Medicine

## 2015-01-12 ENCOUNTER — Ambulatory Visit (INDEPENDENT_AMBULATORY_CARE_PROVIDER_SITE_OTHER): Payer: 59 | Admitting: Internal Medicine

## 2015-01-12 VITALS — BP 138/82 | HR 72 | Temp 98.0°F | Resp 16 | Ht 66.5 in | Wt 135.0 lb

## 2015-01-12 DIAGNOSIS — E559 Vitamin D deficiency, unspecified: Secondary | ICD-10-CM | POA: Diagnosis not present

## 2015-01-12 DIAGNOSIS — R7303 Prediabetes: Secondary | ICD-10-CM

## 2015-01-12 DIAGNOSIS — I10 Essential (primary) hypertension: Secondary | ICD-10-CM

## 2015-01-12 DIAGNOSIS — R7309 Other abnormal glucose: Secondary | ICD-10-CM | POA: Diagnosis not present

## 2015-01-12 DIAGNOSIS — E782 Mixed hyperlipidemia: Secondary | ICD-10-CM

## 2015-01-12 DIAGNOSIS — Z79899 Other long term (current) drug therapy: Secondary | ICD-10-CM

## 2015-01-12 LAB — CBC WITH DIFFERENTIAL/PLATELET
BASOS PCT: 1 % (ref 0–1)
Basophils Absolute: 0 10*3/uL (ref 0.0–0.1)
Eosinophils Absolute: 0 10*3/uL (ref 0.0–0.7)
Eosinophils Relative: 1 % (ref 0–5)
HCT: 44 % (ref 36.0–46.0)
HEMOGLOBIN: 14.7 g/dL (ref 12.0–15.0)
LYMPHS PCT: 40 % (ref 12–46)
Lymphs Abs: 1.5 10*3/uL (ref 0.7–4.0)
MCH: 30.6 pg (ref 26.0–34.0)
MCHC: 33.4 g/dL (ref 30.0–36.0)
MCV: 91.7 fL (ref 78.0–100.0)
MONOS PCT: 11 % (ref 3–12)
MPV: 9 fL (ref 8.6–12.4)
Monocytes Absolute: 0.4 10*3/uL (ref 0.1–1.0)
NEUTROS PCT: 47 % (ref 43–77)
Neutro Abs: 1.7 10*3/uL (ref 1.7–7.7)
PLATELETS: 198 10*3/uL (ref 150–400)
RBC: 4.8 MIL/uL (ref 3.87–5.11)
RDW: 13 % (ref 11.5–15.5)
WBC: 3.7 10*3/uL — ABNORMAL LOW (ref 4.0–10.5)

## 2015-01-12 LAB — TSH: TSH: 0.649 u[IU]/mL (ref 0.350–4.500)

## 2015-01-12 LAB — HEPATIC FUNCTION PANEL
ALT: 16 U/L (ref 6–29)
AST: 24 U/L (ref 10–35)
Albumin: 4.3 g/dL (ref 3.6–5.1)
Alkaline Phosphatase: 92 U/L (ref 33–130)
Bilirubin, Direct: 0.2 mg/dL (ref <=0.2)
Indirect Bilirubin: 0.7 mg/dL (ref 0.2–1.2)
Total Bilirubin: 0.9 mg/dL (ref 0.2–1.2)
Total Protein: 7.2 g/dL (ref 6.1–8.1)

## 2015-01-12 LAB — BASIC METABOLIC PANEL WITH GFR
BUN: 18 mg/dL (ref 7–25)
CALCIUM: 10.5 mg/dL — AB (ref 8.6–10.4)
CO2: 27 mmol/L (ref 20–31)
CREATININE: 1.08 mg/dL — AB (ref 0.50–0.99)
Chloride: 103 mmol/L (ref 98–110)
GFR, EST NON AFRICAN AMERICAN: 53 mL/min — AB (ref >=60)
GFR, Est African American: 61 mL/min (ref >=60)
GLUCOSE: 95 mg/dL (ref 65–99)
POTASSIUM: 4.3 mmol/L (ref 3.5–5.3)
Sodium: 140 mmol/L (ref 135–146)

## 2015-01-12 LAB — LIPID PANEL
CHOL/HDL RATIO: 2.1 ratio (ref <=5.0)
Cholesterol: 145 mg/dL (ref 125–200)
HDL: 68 mg/dL (ref >=46)
LDL CALC: 62 mg/dL (ref <130)
Triglycerides: 76 mg/dL (ref <150)
VLDL: 15 mg/dL (ref <30)

## 2015-01-12 LAB — HEMOGLOBIN A1C
Hgb A1c MFr Bld: 6.1 % — ABNORMAL HIGH (ref <5.7)
MEAN PLASMA GLUCOSE: 128 mg/dL — AB (ref <117)

## 2015-01-12 LAB — MAGNESIUM: Magnesium: 2.1 mg/dL (ref 1.5–2.5)

## 2015-01-12 NOTE — Patient Instructions (Signed)
Please keep a diary of your chest pains and let me know how long it lasts, what you did prior to it and any concerning features.    Continue your blood pressure medications as they are currently doing a good job.    Please take claritin or zyrtec to see if this is your sinuses causing the sore throat. If this does not help that we can try another medication.

## 2015-01-12 NOTE — Progress Notes (Signed)
Patient ID: Shannon Nielsen, female   DOB: 03-03-1946, 69 y.o.   MRN: [id number]  Assessment and Plan:  Hypertension:  -Continue medication,  -monitor blood pressure at home.  -Continue DASH diet.   -Reminder to go to the ER if any CP, SOB, nausea, dizziness, severe HA, changes vision/speech, left arm numbness and tingling, and jaw pain.  Cholesterol: -Continue diet and exercise.  -Check cholesterol.   Pre-diabetes: -Continue diet and exercise.  -Check A1C  Vitamin D Def: -check level -continue medications.   Chest pain -does not sound like ACS -patient does not want EKG today -she will keep a diary of chest pain or contact us as needed if pain increases   Continue diet and meds as discussed. Further disposition pending results of labs.  HPI 69 y.o. female  presents for 3 month follow up with hypertension, hyperlipidemia, prediabetes and vitamin D.   Her blood pressure has been controlled at home, today their BP is BP: 138/82 mmHg.   She does not workout. She denies chest pain, shortness of breath, dizziness.  She does work in her property which they are restoring.     She is on cholesterol medication and denies myalgias. Her cholesterol is at goal. The cholesterol last visit was:   Lab Results  Component Value Date   CHOL 181 10/04/2014   HDL 71 10/04/2014   LDLCALC 96 10/04/2014   TRIG 71 10/04/2014   CHOLHDL 2.5 10/04/2014     She has been working on diet and exercise for prediabetes, and denies foot ulcerations, hyperglycemia, hypoglycemia , increased appetite, nausea, paresthesia of the feet, polydipsia, polyuria, visual disturbances, vomiting and weight loss. Last A1C in the office was:  Lab Results  Component Value Date   HGBA1C 6.2* 10/04/2014  She has been watching her sugar intake and has been working on drinking more water.    Patient is on Vitamin D supplement.  Lab Results  Component Value Date   VD25OH 48 10/04/2014     She reports that she has been  having some sore throat.  She is concerned that this may be due to her thyroid levels.  She reports that she has been having some drawing up of her hands which she is concerned about.  She worries about the fact that she had some chest pains that started today that were sharp and intermittent.  She reports that this happened at rest.      Current Medications:  Current Outpatient Prescriptions on File Prior to Visit  Medication Sig Dispense Refill  . aspirin 81 MG tablet Take 81 mg by mouth daily.      . bisoprolol-hydrochlorothiazide (ZIAC) 10-6.25 MG per tablet Take 1 tablet daily for BP 90 tablet 99  . Cholecalciferol (VITAMIN D-3) 5000 UNITS TABS Take 1 tablet by mouth daily.      . CRESTOR 20 MG tablet take 1 tablet by mouth at bedtime for cholesterol 30 tablet PRN  . Dorzolamide HCl-Timolol Mal PF 22.3-6.8 MG/ML SOLN Apply 1 drop to eye 2 (two) times daily. Left eye    . fluticasone (FLONASE) 50 MCG/ACT nasal spray Use 1 spray 2 times a day 16 g PRN  . latanoprost (XALATAN) 0.005 % ophthalmic solution Place 1 drop into both eyes at bedtime.    Marland Kitchen letrozole (FEMARA) 2.5 MG tablet Take 1 tablet (2.5 mg total) by mouth daily. 30 tablet 11  . OVER THE COUNTER MEDICATION daily. B-Complex with 1000 mcg Biotin    . quinapril (ACCUPRIL)  20 MG tablet take 1 tablet by mouth once daily 30 tablet PRN   No current facility-administered medications on file prior to visit.    Medical History:  Past Medical History  Diagnosis Date  . Hypertension   . Chest tightness   . Palpitations   . Hyperlipidemia   . Prediabetes   . Vitamin D deficiency   . GERD (gastroesophageal reflux disease)   . Breast cancer     Allergies:  Allergies  Allergen Reactions  . Minocycline   . Prednisone Swelling  . Sulfa Antibiotics     Patient does not remember the type of reaction, happened years ago.  . Zithromax [Azithromycin] Swelling  . Penicillins Rash     Review of Systems:  Review of Systems   Constitutional: Negative for fever, chills and malaise/fatigue.  HENT: Positive for congestion and sore throat. Negative for ear pain.   Eyes: Negative.   Respiratory: Negative for cough, shortness of breath and wheezing.   Cardiovascular: Positive for chest pain. Negative for palpitations and leg swelling.  Gastrointestinal: Negative for heartburn, diarrhea, constipation, blood in stool and melena.  Genitourinary: Negative.   Skin: Negative.   Neurological: Positive for dizziness. Negative for sensory change and headaches.  Psychiatric/Behavioral: Negative for depression. The patient is not nervous/anxious and does not have insomnia.     Family history- Review and unchanged  Social history- Review and unchanged  Physical Exam: BP 138/82 mmHg  Pulse 72  Temp(Src) 98 F (36.7 C) (Temporal)  Resp 16  Ht 5' 6.5" (1.689 m)  Wt 135 lb (61.236 kg)  BMI 21.47 kg/m2 Wt Readings from Last 3 Encounters:  01/12/15 135 lb (61.236 kg)  10/25/14 135 lb 8 oz (61.462 kg)  10/04/14 132 lb 9.6 oz (60.147 kg)    General Appearance: Well nourished well developed, in no apparent distress. Eyes: PERRLA, EOMs, conjunctiva no swelling or erythema ENT/Mouth: Ear canals normal without obstruction, swelling, erythma, discharge.  TMs normal bilaterally.  Oropharynx moist, clear, without exudate, or postoropharyngeal swelling. Neck: Supple, thyroid normal,no cervical adenopathy  Respiratory: Respiratory effort normal, Breath sounds clear A&P without rhonchi, wheeze, or rale.  No retractions, no accessory usage. Cardio: RRR with no MRGs. Brisk peripheral pulses without edema.  Abdomen: Soft, + BS,  Non tender, no guarding, rebound, hernias, masses. Musculoskeletal: Full ROM, 5/5 strength, Normal gait Skin: Warm, dry without rashes, lesions, ecchymosis.  Neuro: Awake and oriented X 3, Cranial nerves intact. Normal muscle tone, no cerebellar symptoms. Psych: Normal affect, Insight and Judgment  appropriate.    Starlyn Skeans, PA-C 5:08 PM [facility]

## 2015-01-13 LAB — INSULIN, RANDOM: INSULIN: 9 u[IU]/mL (ref 2.0–19.6)

## 2015-01-13 LAB — VITAMIN D 25 HYDROXY (VIT D DEFICIENCY, FRACTURES): VIT D 25 HYDROXY: 62 ng/mL (ref 30–100)

## 2015-04-08 ENCOUNTER — Other Ambulatory Visit: Payer: Self-pay | Admitting: Internal Medicine

## 2015-04-13 ENCOUNTER — Ambulatory Visit: Payer: Self-pay | Admitting: Internal Medicine

## 2015-04-24 ENCOUNTER — Other Ambulatory Visit (HOSPITAL_BASED_OUTPATIENT_CLINIC_OR_DEPARTMENT_OTHER): Payer: 59

## 2015-04-24 DIAGNOSIS — C50919 Malignant neoplasm of unspecified site of unspecified female breast: Secondary | ICD-10-CM

## 2015-04-24 DIAGNOSIS — C50912 Malignant neoplasm of unspecified site of left female breast: Secondary | ICD-10-CM

## 2015-04-24 DIAGNOSIS — C50212 Malignant neoplasm of upper-inner quadrant of left female breast: Secondary | ICD-10-CM

## 2015-04-24 DIAGNOSIS — C773 Secondary and unspecified malignant neoplasm of axilla and upper limb lymph nodes: Secondary | ICD-10-CM | POA: Diagnosis not present

## 2015-04-24 LAB — CBC WITH DIFFERENTIAL/PLATELET
BASO%: 0.8 % (ref 0.0–2.0)
Basophils Absolute: 0 10*3/uL (ref 0.0–0.1)
EOS ABS: 0 10*3/uL (ref 0.0–0.5)
EOS%: 1 % (ref 0.0–7.0)
HCT: 43.3 % (ref 34.8–46.6)
HGB: 14.2 g/dL (ref 11.6–15.9)
LYMPH%: 36.8 % (ref 14.0–49.7)
MCH: 30.7 pg (ref 25.1–34.0)
MCHC: 32.7 g/dL (ref 31.5–36.0)
MCV: 93.7 fL (ref 79.5–101.0)
MONO#: 0.4 10*3/uL (ref 0.1–0.9)
MONO%: 9 % (ref 0.0–14.0)
NEUT%: 52.4 % (ref 38.4–76.8)
NEUTROS ABS: 2.3 10*3/uL (ref 1.5–6.5)
Platelets: 185 10*3/uL (ref 145–400)
RBC: 4.62 10*6/uL (ref 3.70–5.45)
RDW: 12.7 % (ref 11.2–14.5)
WBC: 4.5 10*3/uL (ref 3.9–10.3)
lymph#: 1.6 10*3/uL (ref 0.9–3.3)

## 2015-04-24 LAB — COMPREHENSIVE METABOLIC PANEL (CC13)
ALBUMIN: 4 g/dL (ref 3.5–5.0)
ALK PHOS: 105 U/L (ref 40–150)
ALT: 17 U/L (ref 0–55)
AST: 22 U/L (ref 5–34)
Anion Gap: 8 mEq/L (ref 3–11)
BILIRUBIN TOTAL: 0.9 mg/dL (ref 0.20–1.20)
BUN: 19.3 mg/dL (ref 7.0–26.0)
CALCIUM: 10.3 mg/dL (ref 8.4–10.4)
CO2: 28 mEq/L (ref 22–29)
Chloride: 104 mEq/L (ref 98–109)
Creatinine: 1.2 mg/dL — ABNORMAL HIGH (ref 0.6–1.1)
EGFR: 56 mL/min/{1.73_m2} — AB (ref >90)
Glucose: 95 mg/dl (ref 70–140)
Potassium: 3.7 mEq/L (ref 3.5–5.1)
SODIUM: 140 meq/L (ref 136–145)
Total Protein: 7 g/dL (ref 6.4–8.3)

## 2015-04-25 LAB — PTH, INTACT AND CALCIUM
CALCIUM: 10.2 mg/dL (ref 8.4–10.5)
PTH: 104 pg/mL — ABNORMAL HIGH (ref 14–64)

## 2015-05-01 ENCOUNTER — Ambulatory Visit (HOSPITAL_BASED_OUTPATIENT_CLINIC_OR_DEPARTMENT_OTHER): Payer: 59 | Admitting: Oncology

## 2015-05-01 VITALS — BP 153/93 | HR 75 | Temp 98.2°F | Resp 18 | Ht 66.5 in | Wt 136.1 lb

## 2015-05-01 DIAGNOSIS — Z853 Personal history of malignant neoplasm of breast: Secondary | ICD-10-CM | POA: Diagnosis not present

## 2015-05-01 DIAGNOSIS — E215 Disorder of parathyroid gland, unspecified: Secondary | ICD-10-CM

## 2015-05-01 DIAGNOSIS — C50412 Malignant neoplasm of upper-outer quadrant of left female breast: Secondary | ICD-10-CM

## 2015-05-01 NOTE — Progress Notes (Signed)
. IDKenyon Eshleman Camberos   DOB: Jul 28, 1945  MR#: [id number]  CSN#:[id number]  PCP: [name], MD GYN: SUFanny Skates OTHER MD: Camie Patience   Chief complaint: Estrogen receptor positive breast cancer  Current treatment:  completing 5 years of letrozole   HISTORY OF PRESENT ILLNESS: As per previously documented note:  Shannon Nielsen had screening mammography October 03, 2008 which was unremarkable.  Towards the end of that year or early the next year, she noticed a little bit of discomfort and itching in both breasts, particularly around the nipple.  She brought this to Dr. Idell Pickles attention and was referred for a diagnostic mammography on August 28, 2009.  On exam, Dr. Miquel Dunn found diffuse firmness of the entire left breast with skin thickening.  By mammography, the left breast showed increased trabecular thickening and an ill-defined mass with developing calcifications in the upper outer quadrant.  The ultrasound on the same day showed in the left breast an irregular hypoechoic mass measuring 2.0 cm.  In the axilla on the left side there was at least one enlarged lymph node which measured 2 cm.  Biopsy was performed the same day of both the breast and the axilla and this showed ([id number]), an invasive ductal carcinoma, high grade.  The lymph node was also involved.  The tumor was estrogen receptor positive at 84%, progesterone receptor positive at 71% with an elevated MIB-1 of 56%.  There was no amplification of HER2/neu with a ratio of 1.32 by [facility].  Bilateral breast MRIs were obtained on March 24.  This showed a symmetric edema throughout the left breast with skin thickening, most prominently anteriorly near the nipple.  Otherwise non-mass like enhancement involving the majority of the left breast including all four quadrants, in the upper inner left breast, there was an ill defined more mass like area which measured up to 2.5 cm, corresponding to the abnormality seen by  ultrasound.  In total the abnormal area of enhancement measuring 7.3 cm in largest dimension.  There was also abnormal enhancement of the thickened skin in the anterior left breast.  The pectoralis muscle did not appear to be involved.  On the right side there were some cysts and in the axilla there was one enlarged left axillary lymph node.  In addition, an incidental finding in the right lobe of the liver was a T2 bright lesion measuring approximately 1 cm.  Her subsequent history is as detailed below. . INTERVAL HISTORY: Shannon Nielsen returns today for follow-up of her estrogen receptor positive breast cancer. She is completing 5 years of letrozole, which she has tolerated well. She has some night sweats, which she prefers not to take any medication for.   REVIEW OF SYSTEMS: She has occasional chest wall pains, which she describes this chest pains. This is clearly related to her prior surgery area mostly it is an occasional sensation of soreness, sometimes a little bit of physical chest wall tightness particularly when she tries to extend both shoulders. There are also some sharp pains that patient's call "stinging" or "stabbing" pains. All of these are very common and only indicate that the patient has had bilateral mastectomies now that her breast cancer is coming back, but nevertheless this remains a source of concern and worry too TM. She also has some seasonal allergies at present. She has back and joint pain which is not more intense or persistent than before. She feels forgetful. A detailed review of systems today was otherwise noncontributory  PAST  MEDICAL HISTORY: Past Medical History  Diagnosis Date  . Hypertension   . Chest tightness   . Palpitations   . Hyperlipidemia   . Prediabetes   . Vitamin D deficiency   . GERD (gastroesophageal reflux disease)   . Breast cancer   history of chronic sinusitis, history of hemorrhoids, history of bilateral tubal ligation,  history of prior multiple  breast biopsies.  PAST SURGICAL HISTORY: Past Surgical History  Procedure Laterality Date  . Mastectomy  August 2011    Bilateral  . Parathyroidectomy    . Nasal sinus surgery      FAMILY HISTORY Family History  Problem Relation Age of Onset  . Diabetes Mother   . Cancer Mother     breast  The patient's father died at the age of 31 from complications of atherosclerotic coronary vascular disease.  The patient's mother is alive at 17 and the patient is engaged on a daily basis in her care, although the patient's mother does have a four-hour nurse who comes daily, and a Careers adviser who stays with her at night.  The patient's mother had breast cancer diagnosed at age 20.  The patient has no sisters.  She has two brothers, one of them has sickle cell trait, the other one has a history of prostate cancer.  The only breast cancers in the family that she is aware of are two cousins on the father's side, one of whom may have been diagnosed before the age of 21.  There was also a maternal aunt who may have been diagnosed with ovarian cancer at the age of 35, but this is not clear.  GYNECOLOGIC HISTORY: She is Gx, P1, first pregnancy to term age 69.  She went through the change of life at 69.  She did not take hormone replacement therapy.  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  Her husband of >20 years, [name], retired from [employer] and currently works as a Paediatric nurse (commutes).  I should add that I saw Shannon Nielsen remotely for what proved to be a history of glomerulonephritis.  Shannon Nielsen's first wife was my patient, Shannon Nielsen, and [name] and [name]'s daughter, Shannon Nielsen, is Shannon Nielsen's stepdaughter.  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  [name];y's biological son is Shannon Nielsen; he works in Insurance underwriter and part time as a Chief Operating Officer.  Shannon Nielsen has two biological and three step-grandchildren.  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 Smoking status: Never Smoker   . Smokeless tobacco: Never Used  . Alcohol Use: No     Colonoscopy:  PAP:  Bone density: Done  8/6/ 2014  Lipid panel:  UTD, Dr. Melford Aase  Allergies  Allergen Reactions  . Minocycline   . Prednisone Swelling  . Sulfa Antibiotics     Patient does not remember the type of reaction, happened years ago.  . Zithromax [Azithromycin] Swelling  . Penicillins Rash    Current Outpatient Prescriptions  Medication Sig Dispense Refill  . aspirin 81 MG tablet Take 81 mg by mouth daily.      . bisoprolol-hydrochlorothiazide (ZIAC) 10-6.25 MG per tablet Take 1 tablet daily for BP 90 tablet 99  . Cholecalciferol (VITAMIN D-3) 5000 UNITS TABS Take 1 tablet by mouth daily.      . CRESTOR 20 MG tablet take 1 tablet by mouth at bedtime for cholesterol 30 tablet PRN  . Dorzolamide HCl-Timolol  Mal PF 22.3-6.8 MG/ML SOLN Apply 1 drop to eye 2 (two) times daily. Left eye    . fluticasone (FLONASE) 50 MCG/ACT nasal spray Use 1 spray 2 times a day 16 g PRN  . latanoprost (XALATAN) 0.005 % ophthalmic solution Place 1 drop into both eyes at bedtime.    Marland Kitchen OVER THE COUNTER MEDICATION daily. B-Complex with 1000 mcg Biotin    . quinapril (ACCUPRIL) 20 MG tablet take 1 tablet by mouth once daily 90 tablet 1   No current facility-administered medications for this visit.    OBJECTIVE: middle-aged African American woman in no acute distress Filed Vitals:   05/01/15 1601  BP: 153/93  Pulse: 75  Temp: 98.2 F (36.8 C)  Resp: 18     Body mass index is 21.64 kg/(m^2).    ECOG FS: 0 Filed Weights   05/01/15 1601  Weight: 136 lb 1.6 oz (61.735 kg)   Sclerae unicteric, pupils round and equal Oropharynx clear and moist-- no thrush or other lesions No cervical or supraclavicular adenopathy Lungs no rales or rhonchi Heart regular rate and rhythm Abd soft, nontender, positive bowel sounds MSK no focal spinal tenderness, no upper extremity  lymphedema Neuro: nonfocal, well oriented, appropriate affect Breasts: Status post bilateral mastectomies. There is no evidence of chest wall recurrence. Both axillae are benign.   LAB RESULTS:  Lab Results  Component Value Date   WBC 4.5 04/24/2015   NEUTROABS 2.3 04/24/2015   HGB 14.2 04/24/2015   HCT 43.3 04/24/2015   MCV 93.7 04/24/2015   PLT 185 04/24/2015      Chemistry      Component Value Date/Time   NA 140 04/24/2015 1556   NA 140 01/12/2015 1736   NA 138 12/16/2011 1410   K 3.7 04/24/2015 1556   K 4.3 01/12/2015 1736   K 4.0 12/16/2011 1410   CL 103 01/12/2015 1736   CL 104 07/28/2012 1437   CL 100 12/16/2011 1410   CO2 28 04/24/2015 1556   CO2 27 01/12/2015 1736   CO2 31 12/16/2011 1410   BUN 19.3 04/24/2015 1556   BUN 18 01/12/2015 1736   BUN 14 12/16/2011 1410   CREATININE 1.2* 04/24/2015 1556   CREATININE 1.08* 01/12/2015 1736   CREATININE 1.10 09/29/2012 1139      Component Value Date/Time   CALCIUM 10.3 04/24/2015 1556   CALCIUM 10.2 04/24/2015 1556   CALCIUM 10.8* 07/28/2012 1618   CALCIUM 9.7 12/16/2011 1410   ALKPHOS 105 04/24/2015 1556   ALKPHOS 92 01/12/2015 1736   ALKPHOS 117* 12/16/2011 1410   AST 22 04/24/2015 1556   AST 24 01/12/2015 1736   AST 23 12/16/2011 1410   ALT 17 04/24/2015 1556   ALT 16 01/12/2015 1736   ALT 18 12/16/2011 1410   BILITOT 0.90 04/24/2015 1556   BILITOT 0.9 01/12/2015 1736   BILITOT 1.10 12/16/2011 1410       Lab Results  Component Value Date   LABCA2 17 07/01/2011    STUDIES: No results found.  ASSESSMENT: 69 y.o. BRCA 1/2 negative [place] woman   (1) status post left breast biopsy in March 2011 for a high-grade invasive ductal carcinoma, with biopsy-proven axillary lymph node involvement at presentation. ER/PR positive, HER2 negative, with MIB-1 of 56%.   (2) received neoadjuvant chemotherapy with docetaxel, doxorubicin and cyclophosphamide x4, discontinued due to peripheral  neuropathy.  (3) received 1 cycle of carboplatin and gemcitabine, also stopped due to worsening neuropathy.   (4) status post bilateral  mastectomies and left axillary lymph node dissection in August 2011, the result showing a complete pathologic response in both the breast and the lymph nodes  (5) completed radiation therapy November 2011  (6) started letrozole November 2011; normal bone density 01/13/2013  (7) elevated PTH and calcium but no localizable adenoma, with history of remote parathyroidectomy  (a)   (8) bilateral solitary thyroid nodules, status post right thyroid nodule biopsy April 2014 consistent with benign goiter.  (9) left-sided chest wall lymphedema   PLAN:  Shannon Nielsen is now little over 5 years from definitive surgery for her breast cancer, with no evidence of disease recurrence. This is very favorable.  She has completed 5 years of letrozole. We discussed possibly continuing another 5 years. She understands this would reduce the risk of distant recurrence by approximately 1%. This is not motivating to her, understandably so. Accordingly she is going off letrozole at this point.  All she needs as far as breast cancer follow-up is concerned is a yearly physician breast exam.  She does have an elevated PTH in the setting of mild hypercalcemia. I think she will need parathyroidectomy and we are putting a surgical referral with regards to that.  Kim needed a refill on her prosthesis prescription which I was glad to provide her with. She also needed reassurance regarding various pains that some of the symptoms she may be experiencing are not going to be due to breast cancer. For this reason I think she would be a good candidate for our survivorship clinic. She is agreeable to giving this a try. I've gone ahead and put her in for a visit in May. If she is comfortable with this arrangement she can be seen this survivorship nurse practitioner indefinitely.  I will be glad to see her  at any point in the future as needed of course, but as of now we are making no further routine appointments for her with me here  Chauncey Cruel, MD    05/01/2015.

## 2015-05-03 ENCOUNTER — Telehealth: Payer: Self-pay | Admitting: Oncology

## 2015-05-03 NOTE — Telephone Encounter (Signed)
lvm fo rpt regarding to Aug 2017 appt °

## 2015-05-08 ENCOUNTER — Encounter: Payer: Self-pay | Admitting: Internal Medicine

## 2015-05-08 ENCOUNTER — Ambulatory Visit (INDEPENDENT_AMBULATORY_CARE_PROVIDER_SITE_OTHER): Payer: 59 | Admitting: Internal Medicine

## 2015-05-08 VITALS — BP 132/80 | HR 68 | Temp 97.7°F | Resp 16 | Ht 66.5 in | Wt 137.2 lb

## 2015-05-08 DIAGNOSIS — Z6821 Body mass index (BMI) 21.0-21.9, adult: Secondary | ICD-10-CM | POA: Diagnosis not present

## 2015-05-08 DIAGNOSIS — I10 Essential (primary) hypertension: Secondary | ICD-10-CM | POA: Diagnosis not present

## 2015-05-08 DIAGNOSIS — E559 Vitamin D deficiency, unspecified: Secondary | ICD-10-CM | POA: Diagnosis not present

## 2015-05-08 DIAGNOSIS — E782 Mixed hyperlipidemia: Secondary | ICD-10-CM | POA: Diagnosis not present

## 2015-05-08 DIAGNOSIS — Z6820 Body mass index (BMI) 20.0-20.9, adult: Secondary | ICD-10-CM | POA: Insufficient documentation

## 2015-05-08 DIAGNOSIS — Z681 Body mass index (BMI) 19 or less, adult: Secondary | ICD-10-CM | POA: Insufficient documentation

## 2015-05-08 DIAGNOSIS — R7303 Prediabetes: Secondary | ICD-10-CM

## 2015-05-08 DIAGNOSIS — Z79899 Other long term (current) drug therapy: Secondary | ICD-10-CM

## 2015-05-08 DIAGNOSIS — Z23 Encounter for immunization: Secondary | ICD-10-CM

## 2015-05-08 DIAGNOSIS — Z6822 Body mass index (BMI) 22.0-22.9, adult: Secondary | ICD-10-CM | POA: Insufficient documentation

## 2015-05-08 DIAGNOSIS — K219 Gastro-esophageal reflux disease without esophagitis: Secondary | ICD-10-CM

## 2015-05-08 DIAGNOSIS — H4010X0 Unspecified open-angle glaucoma, stage unspecified: Secondary | ICD-10-CM | POA: Insufficient documentation

## 2015-05-08 NOTE — Progress Notes (Signed)
Patient ID: Shannon Nielsen, female   DOB: 12/30/1945, 69 y.o.   MRN: [id number]     This very nice 69 y.o. MBF presents for 6 month follow up with Hypertension, Hyperlipidemia, Pre-Diabetes and Vitamin D Deficiency. Patient relates that she was just d/c'd Femara and released from Breast Cancer (01/2010) f/u by Dr Jana Hakim.      Patient is treated for HTN since 1990 & BP has been controlled at home. Today's BP: 132/80 mmHg. Patient has had no complaints of any cardiac type chest pain, palpitations, dyspnea/orthopnea/PND, dizziness, claudication, or dependent edema.     Hyperlipidemia is controlled with diet & meds. Patient denies myalgias or other med SE's. Last Lipids were at goal Cholesterol 145; HDL 68; LDL 62; Triglycerides 76 on 01/12/2015.      Also, the patient has history of PreDiabetes with A1c 5.9% in 2011 and has had no symptoms of reactive hypoglycemia, diabetic polys, paresthesias or visual blurring.  Last A1c was  6.1% on 01/12/2015.      Further, the patient also has history of Vitamin D Deficiency of 13 in 2008 and supplements vitamin D without any suspected side-effects. Last vitamin D was 62 on 01/12/2015.  Medication Sig  . aspirin 81 MG tablet Take 81 mg by mouth daily.    . bisoprolol-hctz(ZIAC) 10-6.25 MG per tablet Take 1 tablet daily for BP  . VITAMIN D 5000 UNITS TABS Take 1 tablet by mouth daily.    . CRESTOR 20 MG tablet take 1 tablet by mouth at bedtime for cholesterol  . Dorzolamide HCl-Timolol Mal PF 22.3-6.8 MG/ML SOLN Apply 1 drop to eye 2 (two) times daily. Left eye  . FLONASE nasal spray Use 1 spray 2 times a day  . XALATAN 0.005 % ophth soln Place 1 drop into both eyes at bedtime.  Marland Kitchen B-Complex with 1000 mcg Biotin daily.   . quinapril 20 MG tablet take 1 tablet by mouth once daily   Allergies  Allergen Reactions  . Minocycline   . Prednisone Swelling  . Sulfa Antibiotics     Patient does not remember the type of reaction, happened years ago.  . Zithromax  [Azithromycin] Swelling  . Penicillins Rash   PMHx:   Past Medical History  Diagnosis Date  . Hypertension   . Chest tightness   . Palpitations   . Hyperlipidemia   . Prediabetes   . Vitamin D deficiency   . GERD (gastroesophageal reflux disease)   . Breast cancer ([place])    Immunization History  Administered Date(s) Administered  . Influenza, High Dose Seasonal PF 03/07/2014, 05/08/2015  . PPD Test 08/30/2013, 10/04/2014  . Pneumococcal Conjugate-13 03/24/2014  . Pneumococcal Polysaccharide-23 09/08/2006  . Tdap 08/27/2012  . Zoster 09/03/2012   Past Surgical History  Procedure Laterality Date  . Mastectomy  August 2011    Bilateral  . Parathyroidectomy    . Nasal sinus surgery     FHx:    Reviewed / unchanged  SHx:    Reviewed / unchanged  Systems Review:  Constitutional: Denies fever, chills, wt changes, headaches, insomnia, fatigue, night sweats, change in appetite. Eyes: Denies redness, blurred vision, diplopia, discharge, itchy, watery eyes.  ENT: Denies discharge, congestion, post nasal drip, epistaxis, sore throat, earache, hearing loss, dental pain, tinnitus, vertigo, sinus pain, snoring.  CV: Denies chest pain, palpitations, irregular heartbeat, syncope, dyspnea, diaphoresis, orthopnea, PND, claudication or edema. Respiratory: denies cough, dyspnea, DOE, pleurisy, hoarseness, laryngitis, wheezing.  Gastrointestinal: Denies dysphagia, odynophagia, heartburn, reflux, water brash,  abdominal pain or cramps, nausea, vomiting, bloating, diarrhea, constipation, hematemesis, melena, hematochezia  or hemorrhoids. Genitourinary: Denies dysuria, frequency, urgency, nocturia, hesitancy, discharge, hematuria or flank pain. Musculoskeletal: Denies arthralgias, myalgias, stiffness, jt. swelling, pain, limping or strain/sprain.  Skin: Denies pruritus, rash, hives, warts, acne, eczema or change in skin lesion(s). Neuro: No weakness, tremor, incoordination, spasms, paresthesia or  pain. Psychiatric: Denies confusion, memory loss or sensory loss. Endo: Denies change in weight, skin or hair change.  Heme/Lymph: No excessive bleeding, bruising or enlarged lymph nodes.  Physical Exam  BP 132/80 mmHg  Pulse 68  Temp(Src) 97.7 F (36.5 C)  Resp 16  Ht 5' 6.5" (1.689 m)  Wt 137 lb 3.2 oz (62.234 kg)  BMI 21.82 kg/m2  Appears well nourished and in no distress.  Eyes: PERRLA, EOMs, conjunctiva no swelling or erythema. Sinuses: No frontal/maxillary tenderness ENT/Mouth: EAC's clear, TM's nl w/o erythema, bulging. Nares clear w/o erythema, swelling, exudates. Oropharynx clear without erythema or exudates. Oral hygiene is good. Tongue normal, non obstructing. Hearing intact.  Neck: Supple. Thyroid nl. Car 2+/2+ without bruits, nodes or JVD. Chest: Respirations nl with BS clear & equal w/o rales, rhonchi, wheezing or stridor.  Cor: Heart sounds normal w/ regular rate and rhythm without sig. murmurs, gallops, clicks, or rubs. Peripheral pulses normal and equal  without edema.  Abdomen: Soft & bowel sounds normal. Non-tender w/o guarding, rebound, hernias, masses, or organomegaly.  Lymphatics: Unremarkable.  Musculoskeletal: Full ROM all peripheral extremities, joint stability, 5/5 strength, and normal gait.  Skin: Warm, dry without exposed rashes, lesions or ecchymosis apparent.  Neuro: Cranial nerves intact, reflexes equal bilaterally. Sensory-motor testing grossly intact. Tendon reflexes grossly intact.  Pysch: Alert & oriented x 3.  Insight and judgement nl & appropriate. No ideations.  Assessment and Plan:  1. Essential hypertension  - TSH  2. Hyperlipidemia  - Lipid panel - TSH  3. Prediabetes  - Hemoglobin A1c - Insulin, random  4. Vitamin D deficiency  - VITAMIN D 25 Hydroxy   5. Gastroesophageal reflux disease, esophagitis presence not specified   6. Body mass index (BMI) of 21.0-21.9 in adult   7. Need for prophylactic vaccination and  inoculation against influenza  - Flu vaccine HIGH DOSE PF (Fluzone High Dose)  8. Medication management  - Magnesium   Recommended regular exercise, BP monitoring, weight control, and discussed med and SE's. Recommended labs to assess and monitor clinical status. Further disposition pending results of labs. Over 30 minutes of exam, counseling, chart review was performed

## 2015-05-08 NOTE — Patient Instructions (Signed)

## 2015-05-09 LAB — LIPID PANEL
CHOL/HDL RATIO: 2.2 ratio (ref <=5.0)
CHOLESTEROL: 146 mg/dL (ref 125–200)
HDL: 67 mg/dL (ref >=46)
LDL Cholesterol: 62 mg/dL (ref <130)
TRIGLYCERIDES: 86 mg/dL (ref <150)
VLDL: 17 mg/dL (ref <30)

## 2015-05-09 LAB — INSULIN, RANDOM: Insulin: 6.6 u[IU]/mL (ref 2.0–19.6)

## 2015-05-09 LAB — VITAMIN D 25 HYDROXY (VIT D DEFICIENCY, FRACTURES): Vit D, 25-Hydroxy: 49 ng/mL (ref 30–100)

## 2015-05-09 LAB — MAGNESIUM: Magnesium: 2.2 mg/dL (ref 1.5–2.5)

## 2015-05-09 LAB — HEMOGLOBIN A1C
Hgb A1c MFr Bld: 6 % — ABNORMAL HIGH (ref <5.7)
Mean Plasma Glucose: 126 mg/dL — ABNORMAL HIGH (ref <117)

## 2015-05-09 LAB — TSH: TSH: 0.785 u[IU]/mL (ref 0.350–4.500)

## 2015-05-12 ENCOUNTER — Telehealth: Payer: Self-pay | Admitting: Oncology

## 2015-05-12 NOTE — Telephone Encounter (Signed)
Patient was scheduled with heather boelter not survivorship but will leave this visit as is.  She has also been scheduled to see dr gerkin 12/21 at 2:15

## 2015-05-17 ENCOUNTER — Other Ambulatory Visit: Payer: Self-pay | Admitting: Internal Medicine

## 2015-06-06 ENCOUNTER — Other Ambulatory Visit: Payer: Self-pay | Admitting: *Deleted

## 2015-06-06 MED ORDER — FLUTICASONE PROPIONATE 50 MCG/ACT NA SUSP: NASAL | Status: DC

## 2015-07-16 ENCOUNTER — Other Ambulatory Visit: Payer: Self-pay | Admitting: Internal Medicine

## 2015-07-16 DIAGNOSIS — N390 Urinary tract infection, site not specified: Secondary | ICD-10-CM

## 2015-07-16 MED ORDER — CIPROFLOXACIN HCL 250 MG PO TABS: ORAL_TABLET | ORAL | Status: DC

## 2015-07-17 ENCOUNTER — Other Ambulatory Visit: Payer: Self-pay | Admitting: Internal Medicine

## 2015-07-17 ENCOUNTER — Other Ambulatory Visit (HOSPITAL_COMMUNITY): Payer: Self-pay | Admitting: Surgery

## 2015-07-17 ENCOUNTER — Other Ambulatory Visit: Payer: Self-pay | Admitting: Surgery

## 2015-07-17 DIAGNOSIS — E213 Hyperparathyroidism, unspecified: Secondary | ICD-10-CM

## 2015-07-17 DIAGNOSIS — E0590 Thyrotoxicosis, unspecified without thyrotoxic crisis or storm: Secondary | ICD-10-CM

## 2015-07-17 DIAGNOSIS — E210 Primary hyperparathyroidism: Secondary | ICD-10-CM

## 2015-07-18 ENCOUNTER — Ambulatory Visit (INDEPENDENT_AMBULATORY_CARE_PROVIDER_SITE_OTHER): Payer: 59 | Admitting: Internal Medicine

## 2015-07-18 VITALS — BP 130/78 | HR 98 | Temp 97.7°F | Wt 135.0 lb

## 2015-07-18 DIAGNOSIS — R300 Dysuria: Secondary | ICD-10-CM

## 2015-07-18 DIAGNOSIS — K649 Unspecified hemorrhoids: Secondary | ICD-10-CM | POA: Diagnosis not present

## 2015-07-18 MED ORDER — HYDROCORTISONE ACETATE 25 MG RE SUPP: 25.0000 mg | Freq: Two times a day (BID) | RECTAL | Status: DC

## 2015-07-18 MED ORDER — NITROFURANTOIN MONOHYD MACRO 100 MG PO CAPS: 100.0000 mg | ORAL_CAPSULE | Freq: Two times a day (BID) | ORAL | Status: AC

## 2015-07-18 NOTE — Progress Notes (Signed)
   Subjective:    Patient ID: Shannon Nielsen, female    DOB: November 11, 1945, 70 y.o.   MRN: [id number]  HPI  Patient presents to the office for evaluation of urinary issues.  She reports that she has been having some problems with hemorrhoids and this has been giving her diarrhea.  She reports that she did try taking some cipro which Dr. Melford Aase had prescribed for her starting on Sunday.  She had taken 4 tablets she had a rash that was red and itchy on her arms.  She stopped taking the medication.  She is still having some urinary frequency and also some mild burning with urinating.    Review of Systems  Constitutional: Negative for fever, chills and fatigue.  Gastrointestinal: Negative for nausea, vomiting, diarrhea, constipation and anal bleeding.  Genitourinary: Positive for dysuria, urgency and frequency. Negative for hematuria.       Objective:   Physical Exam  Constitutional: She is oriented to person, place, and time. She appears well-developed and well-nourished. No distress.  HENT:  Head: Normocephalic.  Mouth/Throat: Oropharynx is clear and moist. No oropharyngeal exudate.  Eyes: Conjunctivae are normal. No scleral icterus.  Neck: Normal range of motion. Neck supple. No JVD present. No thyromegaly present.  Cardiovascular: Normal rate, regular rhythm, normal heart sounds and intact distal pulses.  Exam reveals no gallop and no friction rub.   No murmur heard. Pulmonary/Chest: Effort normal and breath sounds normal. No respiratory distress. She has no wheezes. She has no rales. She exhibits no tenderness.  Abdominal: Soft. Normal appearance and bowel sounds are normal. She exhibits no distension and no mass. There is no tenderness. There is no rebound, no guarding and no CVA tenderness.  Musculoskeletal: Normal range of motion.  Lymphadenopathy:    She has no cervical adenopathy.  Neurological: She is alert and oriented to person, place, and time.  Skin: Skin is warm and dry. She  is not diaphoretic.  Psychiatric: She has a normal mood and affect. Her behavior is normal. Judgment and thought content normal.  Nursing note and vitals reviewed.   Filed Vitals:   07/18/15 1522  Pulse: 98  Temp: 97.7 F (36.5 C)          Assessment & Plan:    1. Dysuria -macrobid x 7 day, BID - Urinalysis, Routine w reflex microscopic (not at [facility]) - Culture, Urine  2. Hemorrhoids, unspecified hemorrhoid type -annusol -miralax prn -avoid straining

## 2015-07-18 NOTE — Patient Instructions (Signed)

## 2015-07-19 LAB — URINALYSIS, MICROSCOPIC ONLY
BACTERIA UA: NONE SEEN [HPF]
CASTS: NONE SEEN [LPF]
Crystals: NONE SEEN [HPF]
RBC / HPF: NONE SEEN RBC/HPF (ref <=2)
YEAST: NONE SEEN [HPF]

## 2015-07-19 LAB — URINALYSIS, ROUTINE W REFLEX MICROSCOPIC
BILIRUBIN URINE: NEGATIVE
Glucose, UA: NEGATIVE
Hgb urine dipstick: NEGATIVE
KETONES UR: NEGATIVE
Nitrite: NEGATIVE
PH: 5 (ref 5.0–8.0)
Protein, ur: NEGATIVE
SPECIFIC GRAVITY, URINE: 1.017 (ref 1.001–1.035)

## 2015-07-19 LAB — URINE CULTURE
COLONY COUNT: NO GROWTH
ORGANISM ID, BACTERIA: NO GROWTH

## 2015-07-27 ENCOUNTER — Ambulatory Visit (HOSPITAL_COMMUNITY): Payer: 59

## 2015-07-27 ENCOUNTER — Ambulatory Visit (HOSPITAL_COMMUNITY)
Admission: RE | Admit: 2015-07-27 | Discharge: 2015-07-27 | Disposition: A | Discharge status: Home / Self Care | Payer: 59 | Source: Ambulatory Visit | Attending: Surgery | Admitting: Surgery

## 2015-07-27 DIAGNOSIS — E210 Primary hyperparathyroidism: Secondary | ICD-10-CM

## 2015-07-27 MED ORDER — TECHNETIUM TC 99M SESTAMIBI GENERIC - CARDIOLITE
26.6000 | Freq: Once | INTRAVENOUS | Status: AC | PRN
  Administered 2015-07-27: 27 via INTRAVENOUS

## 2015-08-04 ENCOUNTER — Ambulatory Visit (HOSPITAL_COMMUNITY)
Admission: RE | Admit: 2015-08-04 | Discharge: 2015-08-04 | Disposition: A | Discharge status: Home / Self Care | Payer: 59 | Source: Ambulatory Visit | Attending: Surgery | Admitting: Surgery

## 2015-08-04 DIAGNOSIS — E042 Nontoxic multinodular goiter: Secondary | ICD-10-CM | POA: Diagnosis not present

## 2015-08-04 DIAGNOSIS — E213 Hyperparathyroidism, unspecified: Secondary | ICD-10-CM | POA: Insufficient documentation

## 2015-08-09 ENCOUNTER — Ambulatory Visit: Payer: Self-pay | Admitting: Surgery

## 2015-10-05 ENCOUNTER — Other Ambulatory Visit: Payer: Self-pay | Admitting: Internal Medicine

## 2015-10-17 ENCOUNTER — Other Ambulatory Visit: Payer: Self-pay | Admitting: Nurse Practitioner

## 2015-10-19 ENCOUNTER — Encounter: Payer: Self-pay | Admitting: Internal Medicine

## 2015-10-24 ENCOUNTER — Encounter: Payer: Self-pay | Admitting: Internal Medicine

## 2015-10-24 ENCOUNTER — Ambulatory Visit (INDEPENDENT_AMBULATORY_CARE_PROVIDER_SITE_OTHER): Payer: 59 | Admitting: Internal Medicine

## 2015-10-24 VITALS — BP 108/74 | HR 72 | Temp 97.0°F | Resp 16 | Ht 66.0 in | Wt 134.6 lb

## 2015-10-24 DIAGNOSIS — E559 Vitamin D deficiency, unspecified: Secondary | ICD-10-CM

## 2015-10-24 DIAGNOSIS — I10 Essential (primary) hypertension: Secondary | ICD-10-CM

## 2015-10-24 DIAGNOSIS — R7303 Prediabetes: Secondary | ICD-10-CM

## 2015-10-24 DIAGNOSIS — Z136 Encounter for screening for cardiovascular disorders: Secondary | ICD-10-CM

## 2015-10-24 DIAGNOSIS — Z79899 Other long term (current) drug therapy: Secondary | ICD-10-CM

## 2015-10-24 DIAGNOSIS — Z0001 Encounter for general adult medical examination with abnormal findings: Secondary | ICD-10-CM

## 2015-10-24 DIAGNOSIS — R5383 Other fatigue: Secondary | ICD-10-CM

## 2015-10-24 DIAGNOSIS — Z111 Encounter for screening for respiratory tuberculosis: Secondary | ICD-10-CM

## 2015-10-24 DIAGNOSIS — Z0000 Encounter for general adult medical examination without abnormal findings: Secondary | ICD-10-CM | POA: Diagnosis not present

## 2015-10-24 DIAGNOSIS — E782 Mixed hyperlipidemia: Secondary | ICD-10-CM

## 2015-10-24 DIAGNOSIS — K6289 Other specified diseases of anus and rectum: Secondary | ICD-10-CM

## 2015-10-24 DIAGNOSIS — E210 Primary hyperparathyroidism: Secondary | ICD-10-CM

## 2015-10-24 DIAGNOSIS — Z1212 Encounter for screening for malignant neoplasm of rectum: Secondary | ICD-10-CM

## 2015-10-24 DIAGNOSIS — K219 Gastro-esophageal reflux disease without esophagitis: Secondary | ICD-10-CM

## 2015-10-24 LAB — BASIC METABOLIC PANEL WITH GFR
BUN: 11 mg/dL (ref 7–25)
CHLORIDE: 105 mmol/L (ref 98–110)
CO2: 26 mmol/L (ref 20–31)
CREATININE: 1.01 mg/dL — AB (ref 0.50–0.99)
Calcium: 10.6 mg/dL — ABNORMAL HIGH (ref 8.6–10.4)
GFR, Est African American: 66 mL/min (ref >=60)
GFR, Est Non African American: 57 mL/min — ABNORMAL LOW (ref >=60)
Glucose, Bld: 96 mg/dL (ref 65–99)
POTASSIUM: 4.4 mmol/L (ref 3.5–5.3)
SODIUM: 141 mmol/L (ref 135–146)

## 2015-10-24 LAB — HEPATIC FUNCTION PANEL
ALK PHOS: 112 U/L (ref 33–130)
ALT: 16 U/L (ref 6–29)
AST: 22 U/L (ref 10–35)
Albumin: 4.6 g/dL (ref 3.6–5.1)
BILIRUBIN DIRECT: 0.3 mg/dL — AB (ref <=0.2)
BILIRUBIN INDIRECT: 1.1 mg/dL (ref 0.2–1.2)
BILIRUBIN TOTAL: 1.4 mg/dL — AB (ref 0.2–1.2)
Total Protein: 7.1 g/dL (ref 6.1–8.1)

## 2015-10-24 LAB — IRON AND TIBC
%SAT: 30 % (ref 11–50)
IRON: 129 ug/dL (ref 45–160)
TIBC: 432 ug/dL (ref 250–450)
UIBC: 303 ug/dL (ref 125–400)

## 2015-10-24 LAB — MAGNESIUM: MAGNESIUM: 2.2 mg/dL (ref 1.5–2.5)

## 2015-10-24 LAB — LIPID PANEL
CHOL/HDL RATIO: 2.1 ratio (ref <=5.0)
Cholesterol: 158 mg/dL (ref 125–200)
HDL: 75 mg/dL (ref >=46)
LDL CALC: 72 mg/dL (ref <130)
Triglycerides: 57 mg/dL (ref <150)
VLDL: 11 mg/dL (ref <30)

## 2015-10-24 LAB — TSH: TSH: 0.5 m[IU]/L

## 2015-10-24 LAB — VITAMIN B12: VITAMIN B 12: 936 pg/mL (ref 200–1100)

## 2015-10-24 NOTE — Patient Instructions (Signed)

## 2015-10-24 NOTE — Progress Notes (Signed)
Patient ID: Shannon Nielsen, female   DOB: [birth year], 70 y.o.   MRN: [id number]  [facility] ADULT & ADOLESCENT INTERNAL MEDICINE                   Unk Pinto, M.D.    Uvaldo Bristle. Silverio Lay, P.A.-C      Starlyn Skeans, P.A.-C   [facility]                [address] [place], [place] [id number] Telephone [phone] Telefax [phone]  __________________________________________________________________________  Annual Screening/Preventative Visit And Comprehensive Evaluation &  Examination     This very nice 70 y.o. MBF presents for a Wellness/Preventative Visit & comprehensive evaluation and management of multiple medical co-morbidities.  Patient has been followed for HTN, Prediabetes, Hyperlipidemia, Hyperparathyroidism and Vitamin D Deficiency. Patient has hx/o hyperparathyroidism and is s/p excision of Rt inferior parathyroid adenoma in 2001. She has again been found to have elevated PTH levels in the range of 100+ and fluctuating sl elevated serum calciums and as her Sestimibi scan was indeterminate, she has been referred to [facility] for a 2sd opinion by Dr Harlow Asa. Patient is also s/p Bilat  Mastectomies in 2011 for Left Breast Cancer followed by Dr Jana Hakim.       HTN predates since 70. Patient's BP has been controlled at home and patient denies any cardiac symptoms as chest pain, palpitations, shortness of breath, dizziness or ankle swelling. Today's BP: 108/74 mmHg      Patient's hyperlipidemia is controlled with diet and medications. Patient denies myalgias or other medication SE's. Last lipids were 05/08/2015: Cholesterol 146; HDL 67; LDL Cholesterol 62; Triglycerides 86 on      Patient has prediabetes predating since 2011 with A1c 6.0% and patient denies reactive hypoglycemic symptoms, visual blurring, diabetic polys, or paresthesias. Last A1c was  6.0% on 05/08/2015.     Finally, patient has history of Vitamin D  Deficiency of "13" in 2008 and last Vitamin D was still relatively low at 49 on 07/07/2014.   Medication Sig  . aspirin 81 MG tablet Take 81 mg by mouth daily.    . Dorzolamide HCl-Timolol  SOLN Apply 1 drop to eye 2 (two) times daily. Left eye  . FLONASE nasal spray Use 1 spray 2 times a day  . XALATAN ophth soln Place 1 drop into both eyes at bedtime.  Marland Kitchen B-Complex with 1000 mcg Biotin daily.   . quinapril  20 MG tablet take 1 tablet by mouth once daily  . rosuvastatin  20 MG tablet take 1 tablet by mouth at bedtime for cholesterol   Allergies  Allergen Reactions  . Minocycline   . Prednisone Swelling  . Sulfa Antibiotics     Patient does not remember the type of reaction, happened years ago.  . Zithromax [Azithromycin] Swelling  . Ciprofloxacin Hcl Rash  . Penicillins Rash   Past Medical History  Diagnosis Date  . Hypertension   . Chest tightness   . Palpitations   . Hyperlipidemia   . Prediabetes   . Vitamin D deficiency   . GERD (gastroesophageal reflux disease)   . Breast cancer [facility])    Health Maintenance  Topic Date Due  . Hepatitis C Screening  Jan 09, 1946  . MAMMOGRAM  08/29/2011  . PNA vac Low Risk Adult (2 of 2 - PPSV23) 03/25/2015  . INFLUENZA VACCINE  01/09/2016  .  TETANUS/TDAP  08/28/2022  . COLONOSCOPY  (last in 2014)  03/30/2023  . DEXA SCAN  Completed  . ZOSTAVAX  Completed   Immunization History  Administered Date(s) Administered  . Influenza, High Dose Seasonal PF 03/07/2014, 05/08/2015  . PPD Test 08/30/2013, 10/04/2014  . Pneumococcal Conjugate-13 03/24/2014  . Pneumococcal Polysaccharide-23 09/08/2006  . Tdap 08/27/2012  . Zoster 09/03/2012   Past Surgical History  Procedure Laterality Date  . Mastectomy  August 2011    Bilateral  . Parathyroidectomy    . Nasal sinus surgery     Family History  Problem Relation Age of Onset  . Diabetes Mother   . Cancer Mother     breast   Social History  Substance Use Topics  . Smoking status:  Never Smoker   . Smokeless tobacco: Never Used  . Alcohol Use: No    ROS Constitutional: Denies fever, chills, weight loss/gain, headaches, insomnia,  night sweats, and change in appetite. Does c/o fatigue. Eyes: Denies redness, blurred vision, diplopia, discharge, itchy, watery eyes.  ENT: Denies discharge, congestion, post nasal drip, epistaxis, sore throat, earache, hearing loss, dental pain, Tinnitus, Vertigo, Sinus pain, snoring.  Cardio: Denies chest pain, palpitations, irregular heartbeat, syncope, dyspnea, diaphoresis, orthopnea, PND, claudication, edema Respiratory: denies cough, dyspnea, DOE, pleurisy, hoarseness, laryngitis, wheezing.  Gastrointestinal: Denies dysphagia, heartburn, reflux, water brash, pain, cramps, nausea, vomiting, bloating, diarrhea, constipation, hematemesis, melena, hematochezia, jaundice, hemorrhoids Genitourinary: Denies dysuria, frequency, urgency, nocturia, hesitancy, discharge, hematuria, flank pain Breast: No Chest wall lumps at mastectomy sites. Musculoskeletal: Denies arthralgia, myalgia, stiffness, Jt. Swelling, pain, limp, and strain/sprain. Denies falls. Skin: Denies puritis, rash, hives, warts, acne, eczema, changing in skin lesion Neuro: No weakness, tremor, incoordination, spasms, paresthesia, pain Psychiatric: Denies confusion, memory loss, sensory loss. Denies Depression. Endocrine: Denies change in weight, skin, hair change, nocturia, and paresthesia, diabetic polys, visual blurring, hyper / hypo glycemic episodes.  Heme/Lymph: No excessive bleeding, bruising, enlarged lymph nodes.  Physical Exam  BP 108/74 mmHg  Pulse 72  Temp(Src) 97 F (36.1 C)  Resp 16  Ht 5\' 6"  (1.676 m)  Wt 134 lb 9.6 oz (61.054 kg)  BMI 21.74 kg/m2  General Appearance: Well nourished and in no apparent distress. Eyes: PERRLA, EOMs, conjunctiva no swelling or erythema, normal fundi and vessels. Sinuses: No frontal/maxillary tenderness ENT/Mouth: EACs patent /  TMs  nl. Nares clear without erythema, swelling, mucoid exudates. Oral hygiene is good. No erythema, swelling, or exudate. Tongue normal, non-obstructing. Tonsils not swollen or erythematous. Hearing normal.  Neck: Supple, thyroid normal. No bruits, nodes or JVD. Respiratory: Respiratory effort normal.  BS equal and clear bilateral without rales, rhonci, wheezing or stridor. Cardio: Heart sounds are normal with regular rate and rhythm and no murmurs, rubs or gallops. Peripheral pulses are normal and equal bilaterally without edema. No aortic or femoral bruits. Chest: symmetric with normal excursions and percussion. Breasts: Bilat mastectomy sites appear benign w/o any evident signs of recurrence and no palpable subcut abnormalities. Abdomen: Flat, soft with bowel sounds active. Nontender, no guarding, rebound, hernias, masses, or organomegaly.  Lymphatics: Non tender without lymphadenopathy.  Musculoskeletal: Full ROM all peripheral extremities, joint stability, 5/5 strength, and normal gait. Skin: Warm and dry without rashes, lesions, cyanosis, clubbing or  ecchymosis.  Neuro: Cranial nerves intact, reflexes equal bilaterally. Normal muscle tone, no cerebellar symptoms. Sensation intact.  Pysch: Alert and oriented X 3, normal affect, Insight and Judgment appropriate.   Assessment and Plan  1. Annual Preventative Screening Examination   -  Microalbumin / creatinine urine ratio - EKG 12-Lead - [place], RETROPERITNL ABD,  LTD - POC Hemoccult Bld/Stl  - Urinalysis, Routine w reflex microscopic  - Vitamin B12 - Iron and TIBC - CBC with Differential/Platelet - BASIC METABOLIC PANEL WITH GFR - Hepatic function panel - Magnesium - Lipid panel - TSH - Hemoglobin A1c - Insulin, random - VITAMIN D 25 Hydroxy  - Parathyroid Hormone, Intact w/Ca  2. Essential hypertension  - Microalbumin / creatinine urine ratio - EKG 12-Lead - [place], RETROPERITNL ABD,  LTD - TSH  3. Hyperlipidemia  - Lipid  panel - TSH  4. Prediabetes  - Hemoglobin A1c - Insulin, random  5. Vitamin D deficiency  - VITAMIN D 25 Hydroxy  6. Hyperparathyroidism, primary ([place])  - Parathyroid Hormone, Intact w/Ca  7. Gastroesophageal reflux disease   8. Screening for rectal cancer  - POC Hemoccult Bld/Stl   9. Other fatigue  - Vitamin B12 - Iron and TIBC - CBC with Differential/Platelet - TSH  10. Medication management  - Urinalysis, Routine w reflex microscopic  - CBC with Differential/Platelet - BASIC METABOLIC PANEL WITH GFR - Hepatic function panel - Magnesium   Continue prudent diet as discussed, weight control, BP monitoring, regular exercise, and medications. Discussed med's effects and SE's. Screening labs and tests as requested with regular follow-up as recommended. Over 40 minutes of exam, counseling, chart review and high complex critical decision making was performed.

## 2015-10-25 LAB — URINALYSIS, MICROSCOPIC ONLY
Bacteria, UA: NONE SEEN [HPF]
Casts: NONE SEEN [LPF]
Crystals: NONE SEEN [HPF]
RBC / HPF: NONE SEEN RBC/HPF (ref <=2)
YEAST: NONE SEEN [HPF]

## 2015-10-25 LAB — CBC WITH DIFFERENTIAL/PLATELET
BASOS ABS: 0 {cells}/uL (ref 0–200)
Basophils Relative: 0 %
EOS PCT: 1 %
Eosinophils Absolute: 36 cells/uL (ref 15–500)
HCT: 45.3 % — ABNORMAL HIGH (ref 35.0–45.0)
HEMOGLOBIN: 15.2 g/dL (ref 11.7–15.5)
LYMPHS ABS: 1080 {cells}/uL (ref 850–3900)
LYMPHS PCT: 30 %
MCH: 31.1 pg (ref 27.0–33.0)
MCHC: 33.6 g/dL (ref 32.0–36.0)
MCV: 92.8 fL (ref 80.0–100.0)
MONOS PCT: 5 %
MPV: 9.3 fL (ref 7.5–12.5)
Monocytes Absolute: 180 cells/uL — ABNORMAL LOW (ref 200–950)
NEUTROS PCT: 64 %
Neutro Abs: 2304 cells/uL (ref 1500–7800)
Platelets: 190 10*3/uL (ref 140–400)
RBC: 4.88 MIL/uL (ref 3.80–5.10)
RDW: 13.3 % (ref 11.0–15.0)
WBC: 3.6 10*3/uL — ABNORMAL LOW (ref 3.8–10.8)

## 2015-10-25 LAB — URINALYSIS, ROUTINE W REFLEX MICROSCOPIC
Bilirubin Urine: NEGATIVE
Glucose, UA: NEGATIVE
Hgb urine dipstick: NEGATIVE
Ketones, ur: NEGATIVE
NITRITE: NEGATIVE
Protein, ur: NEGATIVE
SPECIFIC GRAVITY, URINE: 1.01 (ref 1.001–1.035)
pH: 5 (ref 5.0–8.0)

## 2015-10-25 LAB — MICROALBUMIN / CREATININE URINE RATIO
Creatinine, Urine: 85 mg/dL (ref 20–320)
Microalb Creat Ratio: 2 mcg/mg creat (ref <30)
Microalb, Ur: 0.2 mg/dL

## 2015-10-25 LAB — PTH, INTACT AND CALCIUM
Calcium: 10.6 mg/dL — ABNORMAL HIGH (ref 8.4–10.5)
PTH: 101 pg/mL — ABNORMAL HIGH (ref 14–64)

## 2015-10-25 LAB — HEMOGLOBIN A1C
Hgb A1c MFr Bld: 5.8 % — ABNORMAL HIGH (ref <5.7)
Mean Plasma Glucose: 120 mg/dL

## 2015-10-25 LAB — VITAMIN D 25 HYDROXY (VIT D DEFICIENCY, FRACTURES): Vit D, 25-Hydroxy: 55 ng/mL (ref 30–100)

## 2015-10-25 LAB — INSULIN, RANDOM: Insulin: 4.4 u[IU]/mL (ref 2.0–19.6)

## 2015-10-26 LAB — TB SKIN TEST
Induration: 0 mm
TB Skin Test: NEGATIVE

## 2015-10-28 ENCOUNTER — Encounter: Payer: Self-pay | Admitting: Internal Medicine

## 2015-11-06 ENCOUNTER — Other Ambulatory Visit: Payer: Self-pay | Admitting: Internal Medicine

## 2016-01-25 ENCOUNTER — Other Ambulatory Visit (HOSPITAL_BASED_OUTPATIENT_CLINIC_OR_DEPARTMENT_OTHER): Payer: 59

## 2016-01-25 DIAGNOSIS — Z853 Personal history of malignant neoplasm of breast: Secondary | ICD-10-CM

## 2016-01-25 DIAGNOSIS — C50412 Malignant neoplasm of upper-outer quadrant of left female breast: Secondary | ICD-10-CM

## 2016-01-25 LAB — CBC WITH DIFFERENTIAL/PLATELET
BASO%: 0.6 % (ref 0.0–2.0)
BASOS ABS: 0 10*3/uL (ref 0.0–0.1)
EOS ABS: 0 10*3/uL (ref 0.0–0.5)
EOS%: 0.6 % (ref 0.0–7.0)
HEMATOCRIT: 44.5 % (ref 34.8–46.6)
HGB: 14.5 g/dL (ref 11.6–15.9)
LYMPH#: 1.4 10*3/uL (ref 0.9–3.3)
LYMPH%: 38.9 % (ref 14.0–49.7)
MCH: 30.8 pg (ref 25.1–34.0)
MCHC: 32.6 g/dL (ref 31.5–36.0)
MCV: 94.3 fL (ref 79.5–101.0)
MONO#: 0.4 10*3/uL (ref 0.1–0.9)
MONO%: 10.7 % (ref 0.0–14.0)
NEUT#: 1.7 10*3/uL (ref 1.5–6.5)
NEUT%: 49.2 % (ref 38.4–76.8)
PLATELETS: 187 10*3/uL (ref 145–400)
RBC: 4.72 10*6/uL (ref 3.70–5.45)
RDW: 12.9 % (ref 11.2–14.5)
WBC: 3.5 10*3/uL — ABNORMAL LOW (ref 3.9–10.3)

## 2016-01-25 LAB — COMPREHENSIVE METABOLIC PANEL
ALBUMIN: 4 g/dL (ref 3.5–5.0)
ALK PHOS: 108 U/L (ref 40–150)
ALT: 16 U/L (ref 0–55)
ANION GAP: 6 meq/L (ref 3–11)
AST: 23 U/L (ref 5–34)
BUN: 14.9 mg/dL (ref 7.0–26.0)
CALCIUM: 10.5 mg/dL — AB (ref 8.4–10.4)
CHLORIDE: 106 meq/L (ref 98–109)
CO2: 28 mEq/L (ref 22–29)
Creatinine: 1 mg/dL (ref 0.6–1.1)
EGFR: 64 mL/min/{1.73_m2} — AB (ref >90)
Glucose: 96 mg/dl (ref 70–140)
POTASSIUM: 4.2 meq/L (ref 3.5–5.1)
Sodium: 140 mEq/L (ref 136–145)
Total Bilirubin: 0.88 mg/dL (ref 0.20–1.20)
Total Protein: 7.2 g/dL (ref 6.4–8.3)

## 2016-01-26 LAB — PTH, INTACT AND CALCIUM
Calcium, Ser: 10.2 mg/dL (ref 8.7–10.3)
PTH, Intact: 42 pg/mL (ref 15–65)

## 2016-01-29 ENCOUNTER — Encounter: Payer: Self-pay | Admitting: Internal Medicine

## 2016-01-29 ENCOUNTER — Ambulatory Visit (INDEPENDENT_AMBULATORY_CARE_PROVIDER_SITE_OTHER): Payer: 59 | Admitting: Internal Medicine

## 2016-01-29 VITALS — BP 136/64 | HR 72 | Temp 98.2°F | Resp 16 | Ht 66.0 in | Wt 132.0 lb

## 2016-01-29 DIAGNOSIS — R7303 Prediabetes: Secondary | ICD-10-CM | POA: Diagnosis not present

## 2016-01-29 DIAGNOSIS — Z6821 Body mass index (BMI) 21.0-21.9, adult: Secondary | ICD-10-CM

## 2016-01-29 DIAGNOSIS — E559 Vitamin D deficiency, unspecified: Secondary | ICD-10-CM | POA: Diagnosis not present

## 2016-01-29 DIAGNOSIS — Z79899 Other long term (current) drug therapy: Secondary | ICD-10-CM

## 2016-01-29 DIAGNOSIS — C50412 Malignant neoplasm of upper-outer quadrant of left female breast: Secondary | ICD-10-CM

## 2016-01-29 DIAGNOSIS — I10 Essential (primary) hypertension: Secondary | ICD-10-CM

## 2016-01-29 DIAGNOSIS — E782 Mixed hyperlipidemia: Secondary | ICD-10-CM | POA: Diagnosis not present

## 2016-01-29 DIAGNOSIS — E210 Primary hyperparathyroidism: Secondary | ICD-10-CM

## 2016-01-29 LAB — CBC WITH DIFFERENTIAL/PLATELET
BASOS ABS: 0 {cells}/uL (ref 0–200)
Basophils Relative: 0 %
EOS PCT: 1 %
Eosinophils Absolute: 36 cells/uL (ref 15–500)
HCT: 42.7 % (ref 35.0–45.0)
HEMOGLOBIN: 14.2 g/dL (ref 11.7–15.5)
LYMPHS ABS: 1476 {cells}/uL (ref 850–3900)
Lymphocytes Relative: 41 %
MCH: 30.7 pg (ref 27.0–33.0)
MCHC: 33.3 g/dL (ref 32.0–36.0)
MCV: 92.4 fL (ref 80.0–100.0)
MONOS PCT: 11 %
MPV: 9.5 fL (ref 7.5–12.5)
Monocytes Absolute: 396 cells/uL (ref 200–950)
NEUTROS PCT: 47 %
Neutro Abs: 1692 cells/uL (ref 1500–7800)
Platelets: 183 10*3/uL (ref 140–400)
RBC: 4.62 MIL/uL (ref 3.80–5.10)
RDW: 13.1 % (ref 11.0–15.0)
WBC: 3.6 10*3/uL — ABNORMAL LOW (ref 3.8–10.8)

## 2016-01-29 LAB — BASIC METABOLIC PANEL WITH GFR
BUN: 15 mg/dL (ref 7–25)
CALCIUM: 10.2 mg/dL (ref 8.6–10.4)
CO2: 29 mmol/L (ref 20–31)
CREATININE: 1.18 mg/dL — AB (ref 0.50–0.99)
Chloride: 104 mmol/L (ref 98–110)
GFR, Est African American: 55 mL/min — ABNORMAL LOW (ref >=60)
GFR, Est Non African American: 48 mL/min — ABNORMAL LOW (ref >=60)
GLUCOSE: 103 mg/dL — AB (ref 65–99)
Potassium: 4.1 mmol/L (ref 3.5–5.3)
SODIUM: 140 mmol/L (ref 135–146)

## 2016-01-29 LAB — LIPID PANEL
CHOLESTEROL: 144 mg/dL (ref 125–200)
HDL: 68 mg/dL (ref >=46)
LDL Cholesterol: 64 mg/dL (ref <130)
TRIGLYCERIDES: 59 mg/dL (ref <150)
Total CHOL/HDL Ratio: 2.1 Ratio (ref <=5.0)
VLDL: 12 mg/dL (ref <30)

## 2016-01-29 LAB — HEPATIC FUNCTION PANEL
ALBUMIN: 4.1 g/dL (ref 3.6–5.1)
ALT: 11 U/L (ref 6–29)
AST: 17 U/L (ref 10–35)
Alkaline Phosphatase: 84 U/L (ref 33–130)
BILIRUBIN DIRECT: 0.3 mg/dL — AB (ref <=0.2)
Indirect Bilirubin: 0.9 mg/dL (ref 0.2–1.2)
TOTAL PROTEIN: 6.5 g/dL (ref 6.1–8.1)
Total Bilirubin: 1.2 mg/dL (ref 0.2–1.2)

## 2016-01-29 NOTE — Progress Notes (Signed)
Assessment and Plan:  Hypertension:  -Continue medication,  -monitor blood pressure at home.  -Continue DASH diet.   -Reminder to go to the ER if any CP, SOB, nausea, dizziness, severe HA, changes vision/speech, left arm numbness and tingling, and jaw pain.  Cholesterol: -Continue diet and exercise.  -Check cholesterol.   Pre-diabetes: -Continue diet and exercise.  -Check A1C  Vitamin D Def: -check level -continue medications.    Hyperparathryoidism -is being followed by [facility] -recommended calling Dr. Harlow Asa -will try to send email to Dr. Cena Benton to ask him to contact patient  Breast Cancer  -graduated from Dr.  Jana Hakim  BMI -recommended Boost to go home with.   Continue diet and meds as discussed. Further disposition pending results of labs.  HPI 70 y.o. female  presents for 3 month follow up with hypertension, hyperlipidemia, prediabetes and vitamin D.   Her blood pressure has been controlled at home, today their BP is BP: 136/64.   She does workout. She denies chest pain, shortness of breath, dizziness.   She is on cholesterol medication and denies myalgias. Her cholesterol is at goal. The cholesterol last visit was:   Lab Results  Component Value Date   CHOL 158 10/24/2015   HDL 75 10/24/2015   LDLCALC 72 10/24/2015   TRIG 57 10/24/2015   CHOLHDL 2.1 10/24/2015     She has been working on diet and exercise for prediabetes, and denies foot ulcerations, hyperglycemia, hypoglycemia , increased appetite, nausea, paresthesia of the feet, polydipsia, polyuria, visual disturbances, vomiting and weight loss. Last A1C in the office was:  Lab Results  Component Value Date   HGBA1C 5.8 (H) 10/24/2015    Patient is on Vitamin D supplement.  Lab Results  Component Value Date   VD25OH 55 10/24/2015     Patient reports that she was sent from Dr. Jana Hakim and was then referred to Dr. Harlow Asa who then consequently sent her to [facility].  She reports that she had not heard  anything.  She reports that she was told that she need an appointment.  She reports that [facility] told her they couldn't find her records, but they send them back to her.  She reports that she wants somebody to call over and figure out what is going on.    Current Medications:  Current Outpatient Prescriptions on File Prior to Visit  Medication Sig Dispense Refill  . aspirin 81 MG tablet Take 81 mg by mouth daily.      . bisoprolol-hydrochlorothiazide (ZIAC) 10-6.25 MG tablet TAKE  1 TABLET BY MOUTH ONCE DAILY FOR BLOOD PRESSURE 90 tablet 1  . Cholecalciferol (VITAMIN D PO) Take 2,000 Units by mouth 2 (two) times daily.    . Dorzolamide HCl-Timolol Mal PF 22.3-6.8 MG/ML SOLN Apply 1 drop to eye 2 (two) times daily. Left eye    . fluticasone (FLONASE) 50 MCG/ACT nasal spray Use 1 spray 2 times a day 16 g PRN  . latanoprost (XALATAN) 0.005 % ophthalmic solution Place 1 drop into both eyes at bedtime.    Marland Kitchen OVER THE COUNTER MEDICATION daily. B-Complex with 1000 mcg Biotin    . quinapril (ACCUPRIL) 20 MG tablet take 1 tablet by mouth once daily 90 tablet 1  . rosuvastatin (CRESTOR) 20 MG tablet take 1 tablet by mouth at bedtime for cholesterol 30 tablet 3   No current facility-administered medications on file prior to visit.     Medical History:  Past Medical History:  Diagnosis Date  . Breast cancer (  HCC)   . Chest tightness   . GERD (gastroesophageal reflux disease)   . Hyperlipidemia   . Hypertension   . Palpitations   . Prediabetes   . Vitamin D deficiency     Allergies:  Allergies  Allergen Reactions  . Minocycline   . Prednisone Swelling  . Sulfa Antibiotics     Patient does not remember the type of reaction, happened years ago.  . Zithromax [Azithromycin] Swelling  . Ciprofloxacin Hcl Rash  . Penicillins Rash     Review of Systems:  Review of Systems  Constitutional: Negative for chills, fever and malaise/fatigue.  HENT: Negative for congestion, ear pain and sore throat.    Eyes: Negative.   Respiratory: Negative for cough, shortness of breath and wheezing.   Cardiovascular: Negative for chest pain, palpitations and leg swelling.  Gastrointestinal: Negative for abdominal pain, blood in stool, constipation, diarrhea, heartburn and melena.  Genitourinary: Negative.   Skin: Negative.   Neurological: Negative for dizziness, sensory change, loss of consciousness and headaches.  Psychiatric/Behavioral: Negative for depression. The patient is not nervous/anxious and does not have insomnia.     Family history- Review and unchanged  Social history- Review and unchanged  Physical Exam: BP 136/64   Pulse 72   Temp 98.2 F (36.8 C) (Temporal)   Resp 16   Ht 5\' 6"  (1.676 m)   Wt 132 lb (59.9 kg)   BMI 21.31 kg/m  Wt Readings from Last 3 Encounters:  01/29/16 132 lb (59.9 kg)  10/24/15 134 lb 9.6 oz (61.1 kg)  07/18/15 135 lb (61.2 kg)    General Appearance: Well nourished well developed, in no apparent distress. Eyes: PERRLA, EOMs, conjunctiva no swelling or erythema ENT/Mouth: Ear canals normal without obstruction, swelling, erythma, discharge.  TMs normal bilaterally.  Oropharynx moist, clear, without exudate, or postoropharyngeal swelling. Neck: Supple, thyroid normal,no cervical adenopathy  Respiratory: Respiratory effort normal, Breath sounds clear A&P without rhonchi, wheeze, or rale.  No retractions, no accessory usage. Cardio: RRR with no MRGs. Brisk peripheral pulses without edema.  Abdomen: Soft, + BS,  Non tender, no guarding, rebound, hernias, masses. Musculoskeletal: Full ROM, 5/5 strength, Normal gait Skin: Warm, dry without rashes, lesions, ecchymosis.  Neuro: Awake and oriented X 3, Cranial nerves intact. Normal muscle tone, no cerebellar symptoms. Psych: Normal affect, Insight and Judgment appropriate.    Starlyn Skeans, PA-C 4:36 PM [facility] Adult & Adolescent Internal Medicine

## 2016-01-30 LAB — HEMOGLOBIN A1C
Hgb A1c MFr Bld: 5.6 % (ref <5.7)
MEAN PLASMA GLUCOSE: 114 mg/dL

## 2016-01-30 LAB — TSH: TSH: 0.68 mIU/L

## 2016-02-01 ENCOUNTER — Ambulatory Visit: Payer: 59 | Admitting: Nurse Practitioner

## 2016-02-05 ENCOUNTER — Telehealth: Payer: Self-pay | Admitting: Adult Health

## 2016-02-05 ENCOUNTER — Ambulatory Visit (HOSPITAL_BASED_OUTPATIENT_CLINIC_OR_DEPARTMENT_OTHER): Payer: 59 | Admitting: Adult Health

## 2016-02-05 VITALS — BP 139/86 | HR 78 | Temp 98.2°F | Resp 18 | Ht 66.0 in | Wt 134.7 lb

## 2016-02-05 DIAGNOSIS — K625 Hemorrhage of anus and rectum: Secondary | ICD-10-CM

## 2016-02-05 DIAGNOSIS — Z853 Personal history of malignant neoplasm of breast: Secondary | ICD-10-CM

## 2016-02-05 DIAGNOSIS — C50412 Malignant neoplasm of upper-outer quadrant of left female breast: Secondary | ICD-10-CM

## 2016-02-05 DIAGNOSIS — E213 Hyperparathyroidism, unspecified: Secondary | ICD-10-CM

## 2016-02-05 NOTE — Telephone Encounter (Signed)
appt made and avs printed °

## 2016-02-06 ENCOUNTER — Encounter: Payer: Self-pay | Admitting: Adult Health

## 2016-02-06 NOTE — Progress Notes (Signed)
CLINIC:  Survivorship   REASON FOR VISIT:  Routine follow-up for history of breast cancer.   BRIEF ONCOLOGIC HISTORY:  (from Dr. Virgie Dad recent visit on 05/01/15)    INTERVAL HISTORY:  Shannon Nielsen presents to the [facility] today for routine follow-up for her history of breast cancer.   Since her last visit to the cancer center, she was seen at [facility] for her hyperparathyroidism/hypercalcemia.  She states that she saw Dr. Harland German there, but was not given any return appointments or any information on her plan moving forward.  She has some pain with swallowing at times, but otherwise is largely without complaints from this.  She has some hot flashes/night sweats, but they are chronic and not terribly bothersome for her.  She has noted some rectal bleeding; stated that she has hemorrhoids.  She has occasional anxiety and has some forgetfulness. . She does not exercise regularly.    She endorses some (L) chest wall tenderness/tightness since her mastectomy and subsequent radiation therapy.     REVIEW OF SYSTEMS:  Review of Systems  Constitutional: Negative.   HENT:       Sinus issues/runny nose; chronic  Eyes: Negative.   Respiratory: Negative.   Cardiovascular: Negative.   Gastrointestinal: Positive for constipation.       Occasional rectal bleeding; does feel constipated at times  Genitourinary: Negative.   Musculoskeletal: Negative.   Skin: Negative.   Neurological: Negative.   Endo/Heme/Allergies:       Hot flashes periodically  Psychiatric/Behavioral: Positive for memory loss. The patient is nervous/anxious.   GU: Denies vaginal bleeding, discharge, or dryness.  Chest wall: Denies any new nodularity, masses. (+) tenderness to left chest wall.    A 14-point review of systems was completed and was negative, except as noted above.    PAST MEDICAL/SURGICAL HISTORY:  Past Medical History:  Diagnosis Date  . Breast cancer ([place])   . Chest tightness   . GERD  (gastroesophageal reflux disease)   . Hyperlipidemia   . Hypertension   . Palpitations   . Prediabetes   . Vitamin D deficiency    Past Surgical History:  Procedure Laterality Date  . MASTECTOMY  August 2011   Bilateral  . NASAL SINUS SURGERY    . PARATHYROIDECTOMY       ALLERGIES:  Allergies  Allergen Reactions  . Minocycline   . Prednisone Swelling  . Sulfa Antibiotics     Patient does not remember the type of reaction, happened years ago.  . Zithromax [Azithromycin] Swelling  . Ciprofloxacin Hcl Rash  . Penicillins Rash     CURRENT MEDICATIONS:  Outpatient Encounter Prescriptions as of 02/05/2016  Medication Sig  . aspirin 81 MG tablet Take 81 mg by mouth. Pt takes ASA every other day  . bisoprolol-hydrochlorothiazide (ZIAC) 10-6.25 MG tablet TAKE  1 TABLET BY MOUTH ONCE DAILY FOR BLOOD PRESSURE  . Cholecalciferol (VITAMIN D PO) Take 2,000 Units by mouth daily.   . Dorzolamide HCl-Timolol Mal PF 22.3-6.8 MG/ML SOLN Apply 1 drop to eye 2 (two) times daily. Left eye  . fluticasone (FLONASE) 50 MCG/ACT nasal spray Use 1 spray 2 times a day  . latanoprost (XALATAN) 0.005 % ophthalmic solution Place 1 drop into both eyes at bedtime.  Marland Kitchen OVER THE COUNTER MEDICATION daily. B-Complex with 1000 mcg Biotin  . quinapril (ACCUPRIL) 20 MG tablet take 1 tablet by mouth once daily  . rosuvastatin (CRESTOR) 20 MG tablet take 1 tablet by mouth at bedtime for  cholesterol   No facility-administered encounter medications on file as of 02/05/2016.      ONCOLOGIC FAMILY HISTORY:  Family History  Problem Relation Age of Onset  . Diabetes Mother   . Cancer Mother     breast    GENETIC COUNSELING/TESTING: No records available for review.   SOCIAL HISTORY:  Shannon Nielsen is married to her husband of over 59 years.  She has 1 son and 1 stepdaughter.  She denies any current or history of tobacco, alcohol, or illicit drug use.     PHYSICAL EXAMINATION:  Vital Signs: Vitals:    02/05/16 1546  BP: 139/86  Pulse: 78  Resp: 18  Temp: 98.2 F (36.8 C)   Filed Weights   02/05/16 1546  Weight: 134 lb 11.2 oz (61.1 kg)   General: Well-nourished, well-appearing female in no acute distress.  Accompanied by her daughter today.   HEENT: Head is normocephalic.  Pupils equal and reactive to light. Conjunctivae clear without exudate.  Sclerae anicteric. Oral mucosa is pink, moist.  Oropharynx is pink without lesions or erythema.  Lymph: No cervical, supraclavicular, or infraclavicular lymphadenopathy noted on palpation.  Cardiovascular: Regular rate and rhythm.Marland Kitchen Respiratory: Clear to auscultation bilaterally. Chest expansion symmetric; breathing non-labored.  Breast Exam:  -Left chest wall: s/p mastectomy. No nodularity or palpable masses.  Mild tenderness to palpation in areas previously treated with radiation; healed scar without erythema or nodularity.  -Right chest wall: s/p mastectomy. No nodularity or palpable masses; non-tender; healed scar without erythema or nodularity. -Axilla: No axillary adenopathy bilaterally.  GI: Abdomen soft and round; non-tender, non-distended. Bowel sounds normoactive. No hepatosplenomegaly.   GU: Deferred.  Neuro: No focal deficits. Steady gait.  Psych: Mood and affect normal and appropriate for situation.  Extremities: No edema. Skin: Warm and dry.  LABORATORY DATA:  CBC    Component Value Date/Time   WBC 3.6 (L) 01/29/2016 1704   RBC 4.62 01/29/2016 1704   HGB 14.2 01/29/2016 1704   HGB 14.5 01/25/2016 1522   HCT 42.7 01/29/2016 1704   HCT 44.5 01/25/2016 1522   PLT 183 01/29/2016 1704   PLT 187 01/25/2016 1522   MCV 92.4 01/29/2016 1704   MCV 94.3 01/25/2016 1522   MCH 30.7 01/29/2016 1704   MCHC 33.3 01/29/2016 1704   RDW 13.1 01/29/2016 1704   RDW 12.9 01/25/2016 1522   LYMPHSABS 1,476 01/29/2016 1704   LYMPHSABS 1.4 01/25/2016 1522   MONOABS 396 01/29/2016 1704   MONOABS 0.4 01/25/2016 1522   EOSABS 36 01/29/2016  1704   EOSABS 0.0 01/25/2016 1522   BASOSABS 0 01/29/2016 1704   BASOSABS 0.0 01/25/2016 1522   CMP Latest Ref Rng & Units 01/29/2016 01/25/2016 01/25/2016  Glucose 65 - 99 mg/dL 103(H) 96 -  BUN 7 - 25 mg/dL 15 14.9 -  Creatinine 0.50 - 0.99 mg/dL 1.18(H) 1.0 -  Sodium 135 - 146 mmol/L 140 140 -  Potassium 3.5 - 5.3 mmol/L 4.1 4.2 -  Chloride 98 - 110 mmol/L 104 - -  CO2 20 - 31 mmol/L 29 28 -  Calcium 8.6 - 10.4 mg/dL 10.2 10.2 10.5(H)  Total Protein 6.1 - 8.1 g/dL 6.5 7.2 -  Total Bilirubin 0.2 - 1.2 mg/dL 1.2 0.88 -  Alkaline Phos 33 - 130 U/L 84 108 -  AST 10 - 35 U/L 17 23 -  ALT 6 - 29 U/L 11 16 -    *Labs reviewed with patient in detail.    DIAGNOSTIC IMAGING:  None for this visit.    ASSESSMENT AND PLAN:  Ms.. Nielsen is a pleasant 70 y.o. female with history of left breast invasive ductal carcinoma, ER+/PR+/HER2-, diagnosed in 08/2009, treated with neoadjuvant chemotherapy with Taxotere/Adriamycin/Cytoxan x 4 cycles (stopped due to peripheral neuropathy), then got 1 cycle of Carbo/Gemcitabine (also stopped due to peripheral neuropathy).  She went on to have bilateral mastectomies with left ALND, followed by adjuvant radiation and anti-estrogen therapy with Letrozole x 5 years (04/2010-04/2015).  She presents to the [facility]orship [facility] for surveillance and routine follow-up.   1. History of left breast cancer:  Shannon Nielsen is currently clinically without evidence of disease on physical exam.  Shannon Nielsen had some questions about routine scans for breast cancer survivors.  I shared with her that there is no role for routine imaging in patients who have undergone bilateral mastectomies.  I discussed with the patient and her daughter the rationale for not ordering routine CT scans, etc unless clinically indicated. I explained that should she have a new symptom that warrants additional work-up, then I would be happy to place those orders, but routine imaging is not recommended by NCC[facility].  I will  plan on seeing her in 1 year with history and chest wall exam.  I encouraged her to call me with any questions or concerns before that time and I would be happy to see her sooner, if needed.   2. Hyperparathyroidism follow-up: Shannon Nielsen went to [facility] for 2nd opinion in evaluation and treatment of her hyperparathyroidism/hypercalcemia.  In reviewing the notes on CareEverywhere from Dr. Ian Bushman visit in 11/2015. Shannon Nielsen did not meet the criteria for surgery at that time.  The patient is now asking what she should do next and who should see her for follow-up.  She would like to receive a return call from someone in Dr. Ian Bushman office with her plan of care moving forward, but has been unsuccessful in reaching anyone there.  I let Shannon Nielsen know that we would place a call to Dr. Ian Bushman office to see if we can get the information she is looking for.  The patient sees her PCP regularly and I will defer to their judgment, along with her specialists, on how to best to manage her endocrine disorders as they are unlikely related to her history of breast cancer.   3. Intermediate rectal bleeding likely secondary to constipation: I explained to Shannon Nielsen that likely she has a rectal fissure that is periodically bleeding when she is constipated or straining to have a bowel movement.  She denies any frank blood in her stools; also denies melena.  I encouraged her to try Colace or Miralax OTC to help prevent constipation going forward.  Encouraged her to also drink plenty of water and increase her physical activity to help combat the constipation as well.  I wrote down the names of the OTC medications for her try and provided that to her today.  I encouraged her to follow-up with her PCP if the rectal bleeding continues.     4. Bone health:  Given Shannon Nielsen's age, history of breast cancer, and previous 5-year treatment with letrozole, she is at risk for bone demineralization.  Her last DEXA  scan was on 01/11/13 and was normal.  Since she has completed her anti-estrogen therapy, I will defer any future bone mineral density monitoring to her PCP.   In the meantime, she was encouraged to increase her consumption of foods rich in  calcium, as well as increase her weight-bearing activities.  She was given education on specific food and activities to promote bone health.  5. Cancer screening:  Due to Shannon Nielsen's history and her age, she should receive screening for skin cancers, colon cancer, and gynecologic cancers. She was encouraged to follow-up with her PCP for appropriate cancer screenings.    6. Health maintenance and wellness promotion: Shannon Nielsen was encouraged to consume 5-7 servings of fruits and vegetables per day. She was also encouraged to engage in moderate to vigorous exercise for 30 minutes per day most days of the week. We discussed the Live[employer] Program through the [facility], which would be a great option for Shannon Nielsen to increase her physical activity in a more structured way.  I gave her a brochure today, encouraged her to participate, and gave her instructions on how to get enrolled in [employer] if she chooses to participate.  The exercise may also help improve her (L) chest wall tenderness, which is likely fibrosis and treatment related changes.  She was instructed to limit her alcohol consumption and continue to abstain from tobacco use.    Dispo:  -Return to cancer center to see Survivorship NP in 01/2017; no routine labs necessary.   A total of 30 minutes of face-to-face time was spent with this patient with greater than 50% of that time in counseling and care-coordination.   Mike Craze, NP Survivorship Program [place] [phone]   Note: PRIMARY CARE PROVIDER [name], [place] [phone]

## 2016-03-27 ENCOUNTER — Other Ambulatory Visit: Payer: Self-pay | Admitting: Internal Medicine

## 2016-04-09 ENCOUNTER — Other Ambulatory Visit: Payer: Self-pay | Admitting: Internal Medicine

## 2016-04-09 DIAGNOSIS — E2839 Other primary ovarian failure: Secondary | ICD-10-CM

## 2016-04-12 ENCOUNTER — Ambulatory Visit (INDEPENDENT_AMBULATORY_CARE_PROVIDER_SITE_OTHER): Payer: 59 | Admitting: Internal Medicine

## 2016-04-12 ENCOUNTER — Ambulatory Visit
Admission: RE | Admit: 2016-04-12 | Discharge: 2016-04-12 | Disposition: A | Discharge status: Home / Self Care | Payer: 59 | Source: Ambulatory Visit | Attending: Internal Medicine | Admitting: Internal Medicine

## 2016-04-12 VITALS — BP 118/64 | HR 72 | Temp 97.3°F | Resp 16 | Ht 66.0 in | Wt 134.0 lb

## 2016-04-12 DIAGNOSIS — E2839 Other primary ovarian failure: Secondary | ICD-10-CM

## 2016-04-12 DIAGNOSIS — E785 Hyperlipidemia, unspecified: Secondary | ICD-10-CM | POA: Diagnosis not present

## 2016-04-12 DIAGNOSIS — L820 Inflamed seborrheic keratosis: Secondary | ICD-10-CM

## 2016-04-12 DIAGNOSIS — I10 Essential (primary) hypertension: Secondary | ICD-10-CM | POA: Diagnosis not present

## 2016-04-12 DIAGNOSIS — L723 Sebaceous cyst: Secondary | ICD-10-CM

## 2016-04-13 ENCOUNTER — Encounter: Payer: Self-pay | Admitting: Internal Medicine

## 2016-04-13 NOTE — Progress Notes (Signed)
[place] [facility]   Unk Pinto, M.D.    Uvaldo Bristle. Silverio Lay, P.A.-C      Starlyn Skeans, P.A.-C  [facility]                [address] [place] [id number] Telephone [phone] Telefax [phone] Subjective:    Patient ID: Shannon Nielsen, female    DOB: November 18, 1945, 70 y.o.   MRN: [id number]  HPI  This very nic e 70 yo MBF with HTN, HLD, PreDM, HyperPTH, Vit D def presented for recheck and has no c/o HA,dizziness, CP, palpitations, dyspnea dependent edema. She is concerned about a lump at her vertex of her head which is tender ans a tender lump of her upper mid back.   Medication Sig  . aspirin 81 MG tablet Take 81 mg by mouth. Pt takes ASA every other day  . bisoprolol-hydrochlorothiazide (ZIAC) 10-6.25 MG tablet TAKE  1 TABLET BY MOUTH ONCE DAILY FOR BLOOD PRESSURE  . Cholecalciferol (VITAMIN D PO) Take 2,000 Units by mouth daily.   . Dorzolamide HCl-Timolol Mal PF 22.3-6.8 MG/ML SOLN Apply 1 drop to eye 2 (two) times daily. Left eye  . fluticasone (FLONASE) 50 MCG/ACT nasal spray Use 1 spray 2 times a day  . latanoprost (XALATAN) 0.005 % ophthalmic solution Place 1 drop into both eyes at bedtime.  Marland Kitchen OVER THE COUNTER MEDICATION daily. B-Complex with 1000 mcg Biotin  . quinapril (ACCUPRIL) 20 MG tablet take 1 tablet by mouth once daily  . rosuvastatin (CRESTOR) 20 MG tablet take 1 tablet by mouth at bedtime for cholesterol   Allergies  Allergen Reactions  . Minocycline   . Prednisone Swelling  . Sulfa Antibiotics     Patient does not remember the type of reaction, happened years ago.  . Zithromax [Azithromycin] Swelling  . Ciprofloxacin Hcl Rash  . Penicillins Rash   Past Medical History:  Diagnosis Date  . Breast cancer ([place])   . Chest tightness   . GERD (gastroesophageal reflux disease)   . Hyperlipidemia   . Hypertension   . Palpitations   . Prediabetes   . Vitamin D deficiency     Review of Systems  10 point systems review negative except as above.    Objective:   Physical Exam  BP 118/64   Pulse 72   Temp 97.3 F (36.3 C)   Resp 16   Ht 5\' 6"  (1.676 m)   Wt 134 lb (60.8 kg)   BMI 21.63 kg/m   HEENT - Eac's patent. TM's Nl. EOM's full. PERRLA. NasoOroPharynx clear. Neck - supple. Nl Thyroid. Carotids 2+ & No bruits, nodes, JVD Chest - Clear equal BS w/o Rales, rhonchi, wheezes. Cor - Nl HS. RRR w/o sig MGR. PP 1(+). No edema. Abd - No palpable organomegaly, masses or tenderness. BS nl. MS- FROM w/o deformities. Muscle power, tone and bulk Nl. Gait Nl. Neuro - No obvious Cr N abnormalities. Sensory, motor and Cerebellar functions appear Nl w/o focal abnormalities. Psyche - Mental status normal & appropriate.  No delusions, ideations or obvious mood abnormalities. Skin - There is a tender crusted 10 mm inflamed seborrheic keratosis at the vertex of her scalp and after informed consent the lesion was treated with liquid Nitrogen by a triple freeze thaw.(Procedure: [id number])    There is a tender subcutaneous mass of the upper mid back inter scapular  area. After informed consent, the area was locally anesthetized with 1 ml of Marcaine 0.5% w/epi and then a 1/2 in vertical incision was made with a #10 scalpel. Next a 10 mm scarred fibrotic sebaceous cyst was sharply excised and delivered. The wound edges were aligned and secured wit a horizontal mattress suture with 3-0 Nylon & sterile dressing was applied and patient was instructed in wound care. (Procedure: [id number])     Assessment & Plan:   1. Essential hypertension   2. Sebaceous cyst  - excise d (CPT 11401)   3. Seborrheic keratoses, inflamed  - CryoSurgery (CPT 17000)    - ROV 1 week for suture removal

## 2016-04-14 ENCOUNTER — Other Ambulatory Visit: Payer: Self-pay | Admitting: Internal Medicine

## 2016-04-19 ENCOUNTER — Ambulatory Visit: Payer: 59 | Admitting: Internal Medicine

## 2016-04-19 ENCOUNTER — Encounter: Payer: Self-pay | Admitting: Internal Medicine

## 2016-04-19 VITALS — BP 130/74 | HR 76 | Temp 97.5°F | Resp 16 | Ht 66.0 in | Wt 130.4 lb

## 2016-04-19 DIAGNOSIS — L723 Sebaceous cyst: Secondary | ICD-10-CM

## 2016-04-19 NOTE — Progress Notes (Signed)
  F/u post excision of sebaceous cyst of upper middle back  Wound clean w/adequate healing ridge & no sign of infection  #1 suture removed

## 2016-05-07 ENCOUNTER — Encounter: Payer: Self-pay | Admitting: Internal Medicine

## 2016-05-07 ENCOUNTER — Ambulatory Visit (INDEPENDENT_AMBULATORY_CARE_PROVIDER_SITE_OTHER): Payer: 59 | Admitting: Internal Medicine

## 2016-05-07 VITALS — BP 120/68 | HR 64 | Temp 97.5°F | Resp 16 | Ht 66.0 in | Wt 131.8 lb

## 2016-05-07 DIAGNOSIS — E559 Vitamin D deficiency, unspecified: Secondary | ICD-10-CM

## 2016-05-07 DIAGNOSIS — R7303 Prediabetes: Secondary | ICD-10-CM

## 2016-05-07 DIAGNOSIS — E210 Primary hyperparathyroidism: Secondary | ICD-10-CM | POA: Diagnosis not present

## 2016-05-07 DIAGNOSIS — E782 Mixed hyperlipidemia: Secondary | ICD-10-CM

## 2016-05-07 DIAGNOSIS — Z79899 Other long term (current) drug therapy: Secondary | ICD-10-CM

## 2016-05-07 DIAGNOSIS — I10 Essential (primary) hypertension: Secondary | ICD-10-CM

## 2016-05-07 NOTE — Patient Instructions (Signed)

## 2016-05-07 NOTE — Progress Notes (Signed)
[place] [facility] Unk Pinto, M.D.        Uvaldo Bristle. Silverio Lay, P.A.-C       Starlyn Skeans, P.A.-C  [facility]                [address] [place], [place] [id number] Telephone [phone] Telefax [phone] ______________________________________________________________________     This very nice 70 y.o. MBF presents for 3 month follow up with Hypertension, Hyperlipidemia, Pre-Diabetes and Vitamin D Deficiency. Other problems include hx/o L Breast cancer (2011) s/p Bilat Mastectomies.      Patient has hx/o hyperparathyroidism and post  excision of Rt inferior parathyroid adenoma (2001) and she has again been found to have elevated PTH levels in the range of 100+ and fluctuating sl elevated serum calciums and as her Sestimibi scan was indeterminate, she has been referred to [facility] for a 2sd opinion by Dr Harlow Asa. Dexa BMD on Nov 3rd did show sl worsening osteopenia with T-score -1.5 compared to -1.3 in 2014. Today she relates that she is referred to the Endocrine section at [facility] for a consultation.        Patient is treated for HTN circa 1990 & BP has been controlled at home. Today's BP is  120/68. Patient has had no complaints of any cardiac type chest pain, palpitations, dyspnea/orthopnea/PND, dizziness, claudication, or dependent edema.     Hyperlipidemia is controlled with diet & meds. Patient denies myalgias or other med SE's. Last Lipids were at goal: Lab Results  Component Value Date   CHOL 144 01/29/2016   HDL 68 01/29/2016   LDLCALC 64 01/29/2016   TRIG 59 01/29/2016   CHOLHDL 2.1 01/29/2016      Also, the patient has history of PreDiabetes with A1c 6.0% in 2011 and has had no symptoms of reactive hypoglycemia, diabetic polys, paresthesias or visual blurring.  Last A1c was at goal: Lab Results  Component Value Date   HGBA1C 5.6 01/29/2016      Further, the patient also has  history of Vitamin D Deficiencyin 2008 of "13"  and supplements vitamin D without any suspected side-effects. Last vitamin D was slightly low (goal is 70-100): Lab Results  Component Value Date   VD25OH 55 10/24/2015   Current Outpatient Prescriptions on File Prior to Visit  Medication Sig  . aspirin 81 MG tablet Take 81 mg by mouth daily.   . bisoprolol-hydrochlorothiazide (ZIAC) 10-6.25 MG tablet TAKE  1 TABLET BY MOUTH ONCE DAILY FOR BLOOD PRESSURE  . Cholecalciferol (VITAMIN D PO) Take 2,000 Units by mouth daily.   . Dorzolamide HCl-Timolol Mal PF 22.3-6.8 MG/ML SOLN Apply 1 drop to eye 2 (two) times daily. Left eye  . fluticasone (FLONASE) 50 MCG/ACT nasal spray Use 1 spray 2 times a day  . latanoprost (XALATAN) 0.005 % ophthalmic solution Place 1 drop into both eyes at bedtime.  Marland Kitchen OVER THE COUNTER MEDICATION daily. B-Complex with 1000 mcg Biotin  . quinapril (ACCUPRIL) 20 MG tablet take 1 tablet by mouth once daily  . rosuvastatin (CRESTOR) 20 MG tablet take 1 tablet by mouth at bedtime for cholesterol   No current facility-administered medications on file prior to visit.    Allergies  Allergen Reactions  . Minocycline   . Prednisone Swelling  . Sulfa Antibiotics     Patient does not remember the type of reaction, happened years ago.  Marland Kitchen  Zithromax [Azithromycin] Swelling  . Ciprofloxacin Hcl Rash  . Penicillins Rash   PMHx:   Past Medical History:  Diagnosis Date  . Breast cancer ([place])   . Chest tightness   . GERD (gastroesophageal reflux disease)   . Hyperlipidemia   . Hypertension   . Palpitations   . Prediabetes   . Vitamin D deficiency    Immunization History  Administered Date(s) Administered  . Influenza, High Dose Seasonal PF 03/07/2014, 05/08/2015  . PPD Test 08/30/2013, 10/04/2014, 10/24/2015  . Pneumococcal Conjugate-13 03/24/2014  . Pneumococcal Polysaccharide-23 09/08/2006  . Tdap 08/27/2012  . Zoster 09/03/2012   Past Surgical History:  Procedure  Laterality Date  . MASTECTOMY  August 2011   Bilateral  . NASAL SINUS SURGERY    . PARATHYROIDECTOMY     FHx:    Reviewed / unchanged  SHx:    Reviewed / unchanged  Systems Review:  Constitutional: Denies fever, chills, wt changes, headaches, insomnia, fatigue, night sweats, change in appetite. Eyes: Denies redness, blurred vision, diplopia, discharge, itchy, watery eyes.  ENT: Denies discharge, congestion, post nasal drip, epistaxis, sore throat, earache, hearing loss, dental pain, tinnitus, vertigo, sinus pain, snoring.  CV: Denies chest pain, palpitations, irregular heartbeat, syncope, dyspnea, diaphoresis, orthopnea, PND, claudication or edema. Respiratory: denies cough, dyspnea, DOE, pleurisy, hoarseness, laryngitis, wheezing.  Gastrointestinal: Denies dysphagia, odynophagia, heartburn, reflux, water brash, abdominal pain or cramps, nausea, vomiting, bloating, diarrhea, constipation, hematemesis, melena, hematochezia  or hemorrhoids. Genitourinary: Denies dysuria, frequency, urgency, nocturia, hesitancy, discharge, hematuria or flank pain. Musculoskeletal: Denies arthralgias, myalgias, stiffness, jt. swelling, pain, limping or strain/sprain.  Skin: Denies pruritus, rash, hives, warts, acne, eczema or change in skin lesion(s). Neuro: No weakness, tremor, incoordination, spasms, paresthesia or pain. Psychiatric: Denies confusion, memory loss or sensory loss. Endo: Denies change in weight, skin or hair change.  Heme/Lymph: No excessive bleeding, bruising or enlarged lymph nodes.  Physical Exam BP 120/68   Pulse 64   Temp 97.5 F (36.4 C)   Resp 16   Ht 5\' 6"  (1.676 m)   Wt 131 lb 12.8 oz (59.8 kg)   BMI 21.27 kg/m   Appears well nourished and in no distress.  Eyes: PERRLA, EOMs, conjunctiva no swelling or erythema. Sinuses: No frontal/maxillary tenderness ENT/Mouth: EAC's clear, TM's nl w/o erythema, bulging. Nares clear w/o erythema, swelling, exudates. Oropharynx clear  without erythema or exudates. Oral hygiene is good. Tongue normal, non obstructing. Hearing intact.  Neck: Supple. Thyroid nl. Car 2+/2+ without bruits, nodes or JVD. Chest: Respirations nl with BS clear & equal w/o rales, rhonchi, wheezing or stridor.  Cor: Heart sounds normal w/ regular rate and rhythm without sig. murmurs, gallops, clicks, or rubs. Peripheral pulses normal and equal  without edema.  Abdomen: Soft & bowel sounds normal. Non-tender w/o guarding, rebound, hernias, masses, or organomegaly.  Lymphatics: Unremarkable.  Musculoskeletal: Full ROM all peripheral extremities, joint stability, 5/5 strength, and normal gait.  Skin: Warm, dry without exposed rashes, lesions or ecchymosis apparent.  Neuro: Cranial nerves intact, reflexes equal bilaterally. Sensory-motor testing grossly intact. Tendon reflexes grossly intact.  Pysch: Alert & oriented x 3.  Insight and judgement nl & appropriate. No ideations.  Assessment and Plan:  1. Essential hypertension  - Continue medication, monitor blood pressure at home.  - Continue DASH diet. Reminder to go to the ER if any CP,  - SOB, nausea, dizziness, severe HA, changes vision/speech,  - left arm numbness and tingling and jaw pain. - TSH  2. Hyperlipidemia  -  Continue diet/meds, exercise,& lifestyle modifications.  - Continue monitor periodic cholesterol/liver & renal functions  - Lipid panel - TSH  3. Prediabetes  - Continue diet, exercise, lifestyle modifications.  - Monitor appropriate labs. - Hemoglobin A1c - Insulin, random  4. Vitamin D deficiency  - Continue supplementation. - VITAMIN D 25 Hydroxy   5. Hyperparathyroidism, primary ([place])   6. Medication management  - CBC with Differential/Platelet - BASIC METABOLIC PANEL WITH GFR - Hepatic function panel - Magnesium      Recommended regular exercise, BP monitoring, weight control, and discussed med and SE's. Recommended labs to assess and monitor clinical  status. Further disposition pending results of labs. Over 30 minutes of exam, counseling, chart review was performed

## 2016-05-08 LAB — CBC WITH DIFFERENTIAL/PLATELET
BASOS ABS: 0 {cells}/uL (ref 0–200)
Basophils Relative: 0 %
EOS ABS: 37 {cells}/uL (ref 15–500)
Eosinophils Relative: 1 %
HCT: 42.1 % (ref 35.0–45.0)
Hemoglobin: 13.8 g/dL (ref 11.7–15.5)
LYMPHS PCT: 42 %
Lymphs Abs: 1554 cells/uL (ref 850–3900)
MCH: 30.5 pg (ref 27.0–33.0)
MCHC: 32.8 g/dL (ref 32.0–36.0)
MCV: 92.9 fL (ref 80.0–100.0)
MONOS PCT: 8 %
MPV: 9 fL (ref 7.5–12.5)
Monocytes Absolute: 296 cells/uL (ref 200–950)
NEUTROS PCT: 49 %
Neutro Abs: 1813 cells/uL (ref 1500–7800)
PLATELETS: 165 10*3/uL (ref 140–400)
RBC: 4.53 MIL/uL (ref 3.80–5.10)
RDW: 13 % (ref 11.0–15.0)
WBC: 3.7 10*3/uL — ABNORMAL LOW (ref 3.8–10.8)

## 2016-05-08 LAB — LIPID PANEL
Cholesterol: 157 mg/dL (ref <200)
HDL: 72 mg/dL (ref >50)
LDL Cholesterol: 73 mg/dL (ref <100)
TRIGLYCERIDES: 61 mg/dL (ref <150)
Total CHOL/HDL Ratio: 2.2 Ratio (ref <5.0)
VLDL: 12 mg/dL (ref <30)

## 2016-05-08 LAB — VITAMIN D 25 HYDROXY (VIT D DEFICIENCY, FRACTURES): VIT D 25 HYDROXY: 56 ng/mL (ref 30–100)

## 2016-05-08 LAB — HEPATIC FUNCTION PANEL
ALBUMIN: 3.9 g/dL (ref 3.6–5.1)
ALT: 11 U/L (ref 6–29)
AST: 19 U/L (ref 10–35)
Alkaline Phosphatase: 69 U/L (ref 33–130)
BILIRUBIN TOTAL: 0.7 mg/dL (ref 0.2–1.2)
Bilirubin, Direct: 0.2 mg/dL (ref <=0.2)
Indirect Bilirubin: 0.5 mg/dL (ref 0.2–1.2)
TOTAL PROTEIN: 6.5 g/dL (ref 6.1–8.1)

## 2016-05-08 LAB — BASIC METABOLIC PANEL WITH GFR
BUN: 17 mg/dL (ref 7–25)
CO2: 31 mmol/L (ref 20–31)
CREATININE: 1.05 mg/dL — AB (ref 0.50–0.99)
Calcium: 10.2 mg/dL (ref 8.6–10.4)
Chloride: 106 mmol/L (ref 98–110)
GFR, EST AFRICAN AMERICAN: 63 mL/min (ref >=60)
GFR, Est Non African American: 55 mL/min — ABNORMAL LOW (ref >=60)
Glucose, Bld: 89 mg/dL (ref 65–99)
Potassium: 4 mmol/L (ref 3.5–5.3)
Sodium: 142 mmol/L (ref 135–146)

## 2016-05-08 LAB — HEMOGLOBIN A1C
Hgb A1c MFr Bld: 5.7 % — ABNORMAL HIGH (ref <5.7)
MEAN PLASMA GLUCOSE: 117 mg/dL

## 2016-05-08 LAB — MAGNESIUM: MAGNESIUM: 2.1 mg/dL (ref 1.5–2.5)

## 2016-05-08 LAB — TSH: TSH: 0.42 m[IU]/L

## 2016-05-08 LAB — INSULIN, RANDOM: INSULIN: 23.3 u[IU]/mL — AB (ref 2.0–19.6)

## 2016-05-13 ENCOUNTER — Other Ambulatory Visit: Payer: Self-pay | Admitting: Internal Medicine

## 2016-06-28 ENCOUNTER — Other Ambulatory Visit: Payer: Self-pay | Admitting: Internal Medicine

## 2016-06-28 DIAGNOSIS — E042 Nontoxic multinodular goiter: Secondary | ICD-10-CM

## 2016-06-28 DIAGNOSIS — I10 Essential (primary) hypertension: Secondary | ICD-10-CM

## 2016-06-28 DIAGNOSIS — E213 Hyperparathyroidism, unspecified: Secondary | ICD-10-CM

## 2016-06-28 MED ORDER — BISOPROLOL FUMARATE 10 MG PO TABS: ORAL_TABLET | ORAL | 1 refills | Status: DC

## 2016-07-05 ENCOUNTER — Other Ambulatory Visit: Payer: 59

## 2016-07-05 ENCOUNTER — Ambulatory Visit
Admission: RE | Admit: 2016-07-05 | Discharge: 2016-07-05 | Disposition: A | Discharge status: Home / Self Care | Payer: 59 | Source: Ambulatory Visit | Attending: Internal Medicine | Admitting: Internal Medicine

## 2016-07-05 DIAGNOSIS — E213 Hyperparathyroidism, unspecified: Secondary | ICD-10-CM

## 2016-07-05 DIAGNOSIS — E042 Nontoxic multinodular goiter: Secondary | ICD-10-CM

## 2016-07-11 ENCOUNTER — Other Ambulatory Visit: Payer: Self-pay | Admitting: Internal Medicine

## 2016-08-08 ENCOUNTER — Ambulatory Visit (INDEPENDENT_AMBULATORY_CARE_PROVIDER_SITE_OTHER): Payer: 59 | Admitting: Physician Assistant

## 2016-08-08 ENCOUNTER — Encounter: Payer: Self-pay | Admitting: Physician Assistant

## 2016-08-08 VITALS — BP 130/80 | HR 87 | Temp 97.5°F | Resp 14 | Ht 66.0 in | Wt 132.0 lb

## 2016-08-08 DIAGNOSIS — Z79899 Other long term (current) drug therapy: Secondary | ICD-10-CM

## 2016-08-08 DIAGNOSIS — E782 Mixed hyperlipidemia: Secondary | ICD-10-CM

## 2016-08-08 DIAGNOSIS — I10 Essential (primary) hypertension: Secondary | ICD-10-CM

## 2016-08-08 LAB — BASIC METABOLIC PANEL WITH GFR
BUN: 14 mg/dL (ref 7–25)
CALCIUM: 10.2 mg/dL (ref 8.6–10.4)
CO2: 27 mmol/L (ref 20–31)
CREATININE: 1.19 mg/dL — AB (ref 0.50–0.99)
Chloride: 106 mmol/L (ref 98–110)
GFR, EST AFRICAN AMERICAN: 54 mL/min — AB (ref >=60)
GFR, EST NON AFRICAN AMERICAN: 47 mL/min — AB (ref >=60)
Glucose, Bld: 105 mg/dL — ABNORMAL HIGH (ref 65–99)
Potassium: 4.3 mmol/L (ref 3.5–5.3)
SODIUM: 142 mmol/L (ref 135–146)

## 2016-08-08 LAB — LIPID PANEL
CHOL/HDL RATIO: 1.9 ratio (ref <5.0)
CHOLESTEROL: 143 mg/dL (ref <200)
HDL: 74 mg/dL (ref >50)
LDL Cholesterol: 52 mg/dL (ref <100)
TRIGLYCERIDES: 83 mg/dL (ref <150)
VLDL: 17 mg/dL (ref <30)

## 2016-08-08 LAB — CBC WITH DIFFERENTIAL/PLATELET
Basophils Absolute: 39 cells/uL (ref 0–200)
Basophils Relative: 1 %
EOS PCT: 2 %
Eosinophils Absolute: 78 cells/uL (ref 15–500)
HCT: 43.8 % (ref 35.0–45.0)
HEMOGLOBIN: 14.6 g/dL (ref 11.7–15.5)
LYMPHS ABS: 1560 {cells}/uL (ref 850–3900)
Lymphocytes Relative: 40 %
MCH: 31.2 pg (ref 27.0–33.0)
MCHC: 33.3 g/dL (ref 32.0–36.0)
MCV: 93.6 fL (ref 80.0–100.0)
MONOS PCT: 10 %
MPV: 9.3 fL (ref 7.5–12.5)
Monocytes Absolute: 390 cells/uL (ref 200–950)
NEUTROS ABS: 1833 {cells}/uL (ref 1500–7800)
NEUTROS PCT: 47 %
PLATELETS: 181 10*3/uL (ref 140–400)
RBC: 4.68 MIL/uL (ref 3.80–5.10)
RDW: 13.2 % (ref 11.0–15.0)
WBC: 3.9 10*3/uL (ref 3.8–10.8)

## 2016-08-08 LAB — HEPATIC FUNCTION PANEL
ALT: 20 U/L (ref 6–29)
AST: 24 U/L (ref 10–35)
Albumin: 4.2 g/dL (ref 3.6–5.1)
Alkaline Phosphatase: 72 U/L (ref 33–130)
BILIRUBIN DIRECT: 0.2 mg/dL (ref <=0.2)
BILIRUBIN TOTAL: 1.1 mg/dL (ref 0.2–1.2)
Indirect Bilirubin: 0.9 mg/dL (ref 0.2–1.2)
Total Protein: 6.8 g/dL (ref 6.1–8.1)

## 2016-08-08 LAB — TSH: TSH: 0.51 m[IU]/L

## 2016-08-08 NOTE — Progress Notes (Signed)
Patient ID: Shannon Nielsen, female   DOB: 14-Jul-1945, 71 y.o.   MRN: [id number]  Assessment and Plan:  Hypertension:  -Continue medication,  -monitor blood pressure at home.  -Continue DASH diet.   -Reminder to go to the ER if any CP, SOB, nausea, dizziness, severe HA, changes vision/speech, left arm numbness and tingling, and jaw pain.  Cholesterol: -Continue diet and exercise.  -Check cholesterol.   Pre-diabetes: -Continue diet and exercise.  -Check A1C  Vitamin D Def: -check level -continue medications.     Continue diet and meds as discussed. Further disposition pending results of labs.  HPI 71 y.o. female  presents for 3 month follow up with hypertension, hyperlipidemia, prediabetes and vitamin D.   Her blood pressure has been controlled at home, today their BP is BP: 130/80.   She does not workout. She denies chest pain, shortness of breath, dizziness.     She is on cholesterol medication and denies myalgias. Her cholesterol is at goal. The cholesterol last visit was:   Lab Results  Component Value Date   CHOL 157 05/07/2016   HDL 72 05/07/2016   LDLCALC 73 05/07/2016   TRIG 61 05/07/2016   CHOLHDL 2.2 05/07/2016    She has been working on diet and exercise for prediabetes, and denies foot ulcerations, hyperglycemia, hypoglycemia , increased appetite, nausea, paresthesia of the feet, polydipsia, polyuria, visual disturbances, vomiting and weight loss. Last A1C in the office was:  Lab Results  Component Value Date   HGBA1C 5.7 (H) 05/07/2016  She has been watching her sugar intake and has been working on drinking more water.    Patient is on Vitamin D supplement.  Lab Results  Component Value Date   VD25OH 23 05/07/2016     She reports that she has been having some sore throat.she has had normal thyroid levels, unchanged thyroid US, needs follow up 1 year.    Current Medications:  Current Outpatient Prescriptions on File Prior to Visit  Medication Sig  Dispense Refill  . aspirin 81 MG tablet Take 81 mg by mouth daily.     . bisoprolol (ZEBETA) 10 MG tablet Take 1 tablet daily for BP 90 tablet 1  . Cholecalciferol (VITAMIN D PO) Take 2,000 Units by mouth daily.     . Dorzolamide HCl-Timolol Mal PF 22.3-6.8 MG/ML SOLN Apply 1 drop to eye 2 (two) times daily. Left eye    . fluticasone (FLONASE) 50 MCG/ACT nasal spray instill 1 spray into each nostril twice a day 16 g 3  . latanoprost (XALATAN) 0.005 % ophthalmic solution Place 1 drop into both eyes at bedtime.    Marland Kitchen OVER THE COUNTER MEDICATION daily. B-Complex with 1000 mcg Biotin    . quinapril (ACCUPRIL) 20 MG tablet take 1 tablet by mouth once daily 90 tablet 1  . rosuvastatin (CRESTOR) 20 MG tablet take 1 tablet by mouth at bedtime for cholesterol 90 tablet 1   No current facility-administered medications on file prior to visit.     Medical History:  Past Medical History:  Diagnosis Date  . Breast cancer ([place])   . Chest tightness   . GERD (gastroesophageal reflux disease)   . Hyperlipidemia   . Hypertension   . Palpitations   . Prediabetes   . Vitamin D deficiency     Allergies:  Allergies  Allergen Reactions  . Minocycline   . Prednisone Swelling  . Sulfa Antibiotics     Patient does not remember the type of reaction, happened  years ago.  . Zithromax [Azithromycin] Swelling  . Ciprofloxacin Hcl Rash  . Penicillins Rash     Review of Systems:  Review of Systems  Constitutional: Negative for chills, fever and malaise/fatigue.  HENT: Positive for sore throat. Negative for congestion and ear pain.   Eyes: Negative.   Respiratory: Negative for cough, shortness of breath and wheezing.   Cardiovascular: Negative for chest pain, palpitations and leg swelling.  Gastrointestinal: Negative for blood in stool, constipation, diarrhea, heartburn and melena.  Genitourinary: Negative.   Skin: Negative.   Neurological: Negative for dizziness, sensory change and headaches.   Psychiatric/Behavioral: Negative for depression. The patient is not nervous/anxious and does not have insomnia.     Family history- Review and unchanged  Social history- Review and unchanged  Physical Exam: BP 130/80   Pulse 87   Temp 97.5 F (36.4 C)   Resp 14   Ht 5\' 6"  (1.676 m)   Wt 132 lb (59.9 kg)   SpO2 98%   BMI 21.31 kg/m  Wt Readings from Last 3 Encounters:  08/08/16 132 lb (59.9 kg)  05/07/16 131 lb 12.8 oz (59.8 kg)  04/19/16 130 lb 6.4 oz (59.1 kg)    General Appearance: Well nourished well developed, in no apparent distress. Eyes: PERRLA, EOMs, conjunctiva no swelling or erythema ENT/Mouth: Ear canals normal without obstruction, swelling, erythma, discharge.  TMs normal bilaterally.  Oropharynx moist, clear, without exudate, or postoropharyngeal swelling. Neck: Supple, thyroid normal,no cervical adenopathy  Respiratory: Respiratory effort normal, Breath sounds clear A&P without rhonchi, wheeze, or rale.  No retractions, no accessory usage. Cardio: RRR with no MRGs. Brisk peripheral pulses without edema.  Abdomen: Soft, + BS,  Non tender, no guarding, rebound, hernias, masses. Musculoskeletal: Full ROM, 5/5 strength, Normal gait Skin: Warm, dry without rashes, lesions, ecchymosis.  Neuro: Awake and oriented X 3, Cranial nerves intact. Normal muscle tone, no cerebellar symptoms. Psych: Normal affect, Insight and Judgment appropriate.    Vicie Mutters, PA-C 5:00 PM [facility]

## 2016-08-08 NOTE — Patient Instructions (Signed)
Costochondritis Costochondritis is swelling and irritation (inflammation) of the tissue (cartilage) that connects your ribs to your breastbone (sternum). This causes pain in the front of your chest. The pain usually starts gradually and involves more than one rib. What are the causes? The exact cause of this condition is not always known. It results from stress on the cartilage where your ribs attach to your sternum. The cause of this stress could be:  Chest injury (trauma).  Exercise or activity, such as lifting.  Severe coughing. What increases the risk? You may be at higher risk for this condition if you:  Are female.  Are 30?69 years old.  Recently started a new exercise or work activity.  Have low levels of vitamin D.  Have a condition that makes you cough frequently. What are the signs or symptoms? The main symptom of this condition is chest pain. The pain:  Usually starts gradually and can be sharp or dull.  Gets worse with deep breathing, coughing, or exercise.  Gets better with rest.  May be worse when you press on the sternum-rib connection (tenderness). How is this diagnosed? This condition is diagnosed based on your symptoms, medical history, and a physical exam. Your health care provider will check for tenderness when pressing on your sternum. This is the most important finding. You may also have tests to rule out other causes of chest pain. These may include:  A chest X-ray to check for lung problems.  An electrocardiogram (ECG) to see if you have a heart problem that could be causing the pain.  An imaging scan to rule out a chest or rib fracture. How is this treated? This condition usually goes away on its own over time. Your health care provider may prescribe an NSAID to reduce pain and inflammation. Your health care provider may also suggest that you:  Rest and avoid activities that make pain worse.  Apply heat or cold to the area to reduce pain and  inflammation.  Do exercises to stretch your chest muscles. If these treatments do not help, your health care provider may inject a numbing medicine at the sternum-rib connection to help relieve the pain. Follow these instructions at home:  Avoid activities that make pain worse. This includes any activities that use chest, abdominal, and side muscles.  If directed, put ice on the painful area:  Put ice in a plastic bag.  Place a towel between your skin and the bag.  Leave the ice on for 20 minutes, 2-3 times a day.  If directed, apply heat to the affected area as often as told by your health care provider. Use the heat source that your health care provider recommends, such as a moist heat pack or a heating pad.  Place a towel between your skin and the heat source.  Leave the heat on for 20-30 minutes.  Remove the heat if your skin turns bright red. This is especially important if you are unable to feel pain, heat, or cold. You may have a greater risk of getting burned.  Take over-the-counter and prescription medicines only as told by your health care provider.  Return to your normal activities as told by your health care provider. Ask your health care provider what activities are safe for you.  Keep all follow-up visits as told by your health care provider. This is important. Contact a health care provider if:  You have chills or a fever.  Your pain does not go away or it gets worse.    You have a cough that does not go away (is persistent). Get help right away if:  You have shortness of breath. This information is not intended to replace advice given to you by your health care provider. Make sure you discuss any questions you have with your health care provider. Document Released: 03/06/2005 Document Revised: 12/15/2015 Document Reviewed: 09/20/2015 Elsevier Interactive Patient Education  2017 [employer]  

## 2016-08-09 NOTE — Progress Notes (Signed)
Pt aware of lab results & voiced understanding of those results.

## 2016-09-27 ENCOUNTER — Telehealth: Payer: Self-pay | Admitting: Physician Assistant

## 2016-09-27 ENCOUNTER — Other Ambulatory Visit: Payer: Self-pay | Admitting: Physician Assistant

## 2016-09-27 MED ORDER — LEVOFLOXACIN 500 MG PO TABS: 500.0000 mg | ORAL_TABLET | Freq: Every day | ORAL | 0 refills | Status: DC

## 2016-09-27 NOTE — Telephone Encounter (Signed)
LVM to let pt know rx was sent in pharmacy

## 2016-09-27 NOTE — Telephone Encounter (Signed)
The patient also wishes to address some mild URI symptoms of congestion, nasal stuffiness, cough for the past week without fever. On flonase, get on allergy pill too, take levaquin (same family as cipro) if not better after 2-3 days, need total of 7-10 days to fight off infection. Lots of fluids.   Call if not better or go to Er if worse over weekend

## 2016-10-28 ENCOUNTER — Other Ambulatory Visit: Payer: Self-pay | Admitting: *Deleted

## 2016-10-28 MED ORDER — QUINAPRIL HCL 20 MG PO TABS: 20.0000 mg | ORAL_TABLET | Freq: Every day | ORAL | 1 refills | Status: DC

## 2016-11-17 NOTE — Patient Instructions (Signed)

## 2016-11-17 NOTE — Progress Notes (Signed)
[facility] Unk Pinto, M.D.      Uvaldo Bristle. Silverio Lay, P.A.-C [facility]                [address] [place], [place] [address] Telephone [phone] Telefax [phone]  Annual Screening/Preventative Visit & Comprehensive Evaluation &  Examination     This very nice 71 y.o. MBF presents for a Screening/Preventative Visit & comprehensive evaluation and management of multiple medical co-morbidities.  Patient has been followed for HTN, Prediabetes, Hyperlipidemia and Vitamin D Deficiency. Patient is s/p Bilat Mastectomies for Lt Breast Ca in 2011.      Patient had excision of a Rt Parathyroid Adenoma for Hyperparathyroidism in 2001 has had fluctuating PTH & calcium levels concern for recurrent hyperparathyroidism being followed by Dr. Harland German at [facility].      HTN predates since 68. Patient's BP has been controlled at home and patient denies any cardiac symptoms as chest pain, palpitations, shortness of breath, dizziness or ankle swelling. Today's BP is elevated at 148/72 and rechecked at 136/76.     Patient's hyperlipidemia is controlled with diet and medications. Patient denies myalgias or other medication SE's. Last lipids were  Lab Results  Component Value Date   CHOL 143 08/08/2016   HDL 74 08/08/2016   LDLCALC 52 08/08/2016   TRIG 83 08/08/2016   CHOLHDL 1.9 08/08/2016      Patient has prediabetes (A1c 6.0% in 2011) and patient denies reactive hypoglycemic symptoms, visual blurring, diabetic polys, or paresthesias. Last A1c was not at goal:  Lab Results  Component Value Date   HGBA1C 5.7 (H) 05/07/2016      Finally, patient has history of Vitamin D Deficiency ("13" in 2008) and last Vitamin D was near goal (goal 70-100): Lab Results  Component Value Date   VD25OH 56 05/07/2016   Current Outpatient Prescriptions on File Prior to Visit  Medication Sig  . aspirin 81 MG  tablet Take 81 mg by mouth daily.   . bisoprolol  10 MG tablet Take 1 tablet daily for BP  . VITAMIN D Take 2,000 Units by mouth daily.   . Dorzolamide -Timolol Apply 1 drop to eye 2 (two) times daily. Left eye  . FLONASE nasal spray instill 1 spray into each nostril twice a day  . lXALATAN ophthalmic solution Place 1 drop into both eyes at bedtime.  Marland Kitchen B-Complex w/1000 mcg Biotin daily.   . quinapril  20 MG tablet Take 1 tablet (20 mg total) by mouth daily.  . rosuvastatin  20 MG tablet take 1 tablet by mouth at bedtime for cholesterol   Allergies  Allergen Reactions  . Minocycline   . Prednisone Swelling  . Sulfa Antibiotics     Patient does not remember the type of reaction, happened years ago.  . Zithromax [Azithromycin] Swelling  . Ciprofloxacin Hcl Rash  . Penicillins Rash   Past Medical History:  Diagnosis Date  . Breast cancer ([place])   . Chest tightness   . GERD (gastroesophageal reflux disease)   . Hyperlipidemia   . Hypertension   . Palpitations   . Prediabetes   . Vitamin D deficiency    Health Maintenance  Topic Date Due  . Hepatitis C Screening  29-Apr-1946  . MAMMOGRAM  08/29/2011  . PNA vac Low Risk Adult (2 of 2 - PPSV23) 03/25/2015  . INFLUENZA VACCINE  01/08/2017  . TETANUS/TDAP  08/28/2022  . COLONOSCOPY  03/30/2023  . DEXA SCAN  Completed   Immunization History  Administered Date(s) Administered  . Influenza, High Dose Seasonal PF 03/07/2014, 05/08/2015  . PPD Test 08/30/2013, 10/04/2014, 10/24/2015  . Pneumococcal Conjugate-13 03/24/2014  . Pneumococcal Polysaccharide-23 09/08/2006  . Tdap 08/27/2012  . Zoster 09/03/2012   Past Surgical History:  Procedure Laterality Date  . MASTECTOMY  August 2011   Bilateral  . NASAL SINUS SURGERY    . PARATHYROIDECTOMY     Family History  Problem Relation Age of Onset  . Diabetes Mother   . Cancer Mother        breast   Social History  Substance Use Topics  . Smoking status: Never Smoker  .  Smokeless tobacco: Never Used  . Alcohol use No    ROS Constitutional: Denies fever, chills, weight loss/gain, headaches, insomnia,  night sweats, and change in appetite. Does c/o fatigue. Eyes: Denies redness, blurred vision, diplopia, discharge, itchy, watery eyes.  ENT: Denies discharge, congestion, post nasal drip, epistaxis, sore throat, earache, hearing loss, dental pain, Tinnitus, Vertigo, Sinus pain, snoring.  Cardio: Denies chest pain, palpitations, irregular heartbeat, syncope, dyspnea, diaphoresis, orthopnea, PND, claudication, edema Respiratory: denies cough, dyspnea, DOE, pleurisy, hoarseness, laryngitis, wheezing.  Gastrointestinal: Denies dysphagia, heartburn, reflux, water brash, pain, cramps, nausea, vomiting, bloating, diarrhea, constipation, hematemesis, melena, hematochezia, jaundice, hemorrhoids Genitourinary: Denies dysuria, frequency, urgency, nocturia, hesitancy, discharge, hematuria, flank pain Breast: Breast lumps, nipple discharge, bleeding.  Musculoskeletal: Denies arthralgia, myalgia, stiffness, Jt. Swelling, pain, limp, and strain/sprain. Denies falls. Skin: Denies puritis, rash, hives, warts, acne, eczema, changing in skin lesion Neuro: No weakness, tremor, incoordination, spasms, paresthesia, pain Psychiatric: Denies confusion, memory loss, sensory loss. Denies Depression. Endocrine: Denies change in weight, skin, hair change, nocturia, and paresthesia, diabetic polys, visual blurring, hyper / hypo glycemic episodes.  Heme/Lymph: No excessive bleeding, bruising, enlarged lymph nodes.  Physical Exam  BP 136/76   Pulse 60   Temp 97.5 F (36.4 C)   Resp 16   Ht 5\' 6"  (1.676 m)   Wt 125 lb (56.7 kg)   BMI 20.18 kg/m   General Appearance: Well nourished, well groomed and in no apparent distress.  Eyes: PERRLA, EOMs, conjunctiva no swelling or erythema, normal fundi and vessels. Sinuses: No frontal/maxillary tenderness ENT/Mouth: EACs patent / TMs  nl.  Nares clear without erythema, swelling, mucoid exudates. Oral hygiene is good. No erythema, swelling, or exudate. Tongue normal, non-obstructing. Tonsils not swollen or erythematous. Hearing normal.  Neck: Supple, thyroid normal. No bruits, nodes or JVD. Respiratory: Respiratory effort normal.  BS equal and clear bilateral without rales, rhonci, wheezing or stridor. Cardio: Heart sounds are normal with regular rate and rhythm and no murmurs, rubs or gallops. Peripheral pulses are normal and equal bilaterally without edema. No aortic or femoral bruits. Chest: symmetric with normal excursions and percussion. Breasts: Symmetric, without lumps, nipple discharge, retractions, or fibrocystic changes.  Abdomen: Flat, soft with bowel sounds active. Nontender, no guarding, rebound, hernias, masses, or organomegaly.  Lymphatics: Non tender without lymphadenopathy.  Genitourinary:  Musculoskeletal: Full ROM all peripheral extremities, joint stability, 5/5 strength, and normal gait. Skin: Warm and dry without rashes, lesions, cyanosis, clubbing or  ecchymosis.  Neuro: Cranial nerves intact, reflexes equal bilaterally. Normal muscle tone, no cerebellar symptoms. Sensation intact.  Pysch: Alert and oriented X 3, normal affect, Insight and Judgment appropriate.   Assessment and Plan  1. Annual Preventative Screening Examination   2. Essential hypertension  -  EKG 12-Lead - [place], RETROPERITNL ABD,  LTD - Urinalysis, Routine w reflex microscopic - Microalbumin / creatinine urine ratio - CBC with Differential/Platelet - BASIC METABOLIC PANEL WITH GFR - Magnesium - TSH  3. Hyperlipidemia, mixed  - EKG 12-Lead - [place], RETROPERITNL ABD,  LTD - Hepatic function panel - Lipid panel - TSH  4. Prediabetes  - EKG 12-Lead - [place], RETROPERITNL ABD,  LTD - Hemoglobin A1c - Insulin, random  5. Vitamin D deficiency  - VITAMIN D 25 Hydroxy   6. Hyperparathyroidism, primary ([place])  - PTH, intact and  calcium  7. Breast Cancer, Lt,- ER(+) ([place])   8. Screening for rectal cancer  - POC Hemoccult Bld/Stl   9. Screening for ischemic heart disease  - EKG 12-Lead  10. Screening for AAA (aortic abdominal aneurysm)  - [place], RETROPERITNL ABD,  LTD  11. Fatigue, unspecified type  - Vitamin B12 - Iron and TIBC - CBC with Differential/Platelet  12. Medication management  - Urinalysis, Routine w reflex microscopic - Microalbumin / creatinine urine ratio - CBC with Differential/Platelet - BASIC METABOLIC PANEL WITH GFR - Hepatic function panel - Magnesium - Lipid panel - TSH - Hemoglobin A1c - Insulin, random - VITAMIN D 25 Hydroxy        Patient was counseled in prudent diet to achieve/maintain BMI less than 25 for weight control, BP monitoring, regular exercise and medications. Discussed med's effects and SE's. Screening labs and tests as requested with regular follow-up as recommended. Over 40 minutes of exam, counseling, chart review and high complex critical decision making was performed.

## 2016-11-18 ENCOUNTER — Ambulatory Visit (INDEPENDENT_AMBULATORY_CARE_PROVIDER_SITE_OTHER): Payer: Medicare HMO | Admitting: Internal Medicine

## 2016-11-18 ENCOUNTER — Ambulatory Visit (HOSPITAL_COMMUNITY)
Admission: RE | Admit: 2016-11-18 | Discharge: 2016-11-18 | Disposition: A | Discharge status: Home / Self Care | Payer: Medicare HMO | Source: Ambulatory Visit | Attending: Internal Medicine | Admitting: Internal Medicine

## 2016-11-18 ENCOUNTER — Encounter: Payer: Self-pay | Admitting: Internal Medicine

## 2016-11-18 VITALS — BP 136/76 | HR 60 | Temp 97.5°F | Resp 16 | Ht 66.0 in | Wt 125.0 lb

## 2016-11-18 DIAGNOSIS — R7303 Prediabetes: Secondary | ICD-10-CM | POA: Diagnosis not present

## 2016-11-18 DIAGNOSIS — Z79899 Other long term (current) drug therapy: Secondary | ICD-10-CM

## 2016-11-18 DIAGNOSIS — E210 Primary hyperparathyroidism: Secondary | ICD-10-CM

## 2016-11-18 DIAGNOSIS — C50412 Malignant neoplasm of upper-outer quadrant of left female breast: Secondary | ICD-10-CM

## 2016-11-18 DIAGNOSIS — I10 Essential (primary) hypertension: Secondary | ICD-10-CM

## 2016-11-18 DIAGNOSIS — R079 Chest pain, unspecified: Secondary | ICD-10-CM

## 2016-11-18 DIAGNOSIS — Z0000 Encounter for general adult medical examination without abnormal findings: Secondary | ICD-10-CM

## 2016-11-18 DIAGNOSIS — Z170 Estrogen receptor positive status [ER+]: Secondary | ICD-10-CM | POA: Insufficient documentation

## 2016-11-18 DIAGNOSIS — E559 Vitamin D deficiency, unspecified: Secondary | ICD-10-CM

## 2016-11-18 DIAGNOSIS — Z1212 Encounter for screening for malignant neoplasm of rectum: Secondary | ICD-10-CM

## 2016-11-18 DIAGNOSIS — R5383 Other fatigue: Secondary | ICD-10-CM | POA: Diagnosis not present

## 2016-11-18 DIAGNOSIS — Z136 Encounter for screening for cardiovascular disorders: Secondary | ICD-10-CM

## 2016-11-18 DIAGNOSIS — E782 Mixed hyperlipidemia: Secondary | ICD-10-CM | POA: Diagnosis not present

## 2016-11-18 DIAGNOSIS — Z0001 Encounter for general adult medical examination with abnormal findings: Secondary | ICD-10-CM

## 2016-11-18 LAB — BASIC METABOLIC PANEL WITH GFR
BUN: 10 mg/dL (ref 7–25)
CHLORIDE: 106 mmol/L (ref 98–110)
CO2: 23 mmol/L (ref 20–31)
CREATININE: 1.09 mg/dL — AB (ref 0.50–0.99)
Calcium: 10.6 mg/dL — ABNORMAL HIGH (ref 8.6–10.4)
GFR, Est African American: 60 mL/min (ref >=60)
GFR, Est Non African American: 52 mL/min — ABNORMAL LOW (ref >=60)
GLUCOSE: 90 mg/dL (ref 65–99)
POTASSIUM: 4.4 mmol/L (ref 3.5–5.3)
Sodium: 141 mmol/L (ref 135–146)

## 2016-11-18 LAB — CBC WITH DIFFERENTIAL/PLATELET
BASOS ABS: 0 {cells}/uL (ref 0–200)
BASOS PCT: 0 %
Eosinophils Absolute: 36 cells/uL (ref 15–500)
Eosinophils Relative: 1 %
HEMATOCRIT: 46.4 % — AB (ref 35.0–45.0)
Hemoglobin: 15.6 g/dL — ABNORMAL HIGH (ref 11.7–15.5)
LYMPHS PCT: 31 %
Lymphs Abs: 1116 cells/uL (ref 850–3900)
MCH: 31.2 pg (ref 27.0–33.0)
MCHC: 33.6 g/dL (ref 32.0–36.0)
MCV: 92.8 fL (ref 80.0–100.0)
MONO ABS: 288 {cells}/uL (ref 200–950)
MONOS PCT: 8 %
MPV: 9.6 fL (ref 7.5–12.5)
Neutro Abs: 2160 cells/uL (ref 1500–7800)
Neutrophils Relative %: 60 %
Platelets: 170 10*3/uL (ref 140–400)
RBC: 5 MIL/uL (ref 3.80–5.10)
RDW: 13.3 % (ref 11.0–15.0)
WBC: 3.6 10*3/uL — ABNORMAL LOW (ref 3.8–10.8)

## 2016-11-18 LAB — LIPID PANEL
CHOLESTEROL: 159 mg/dL (ref <200)
HDL: 77 mg/dL (ref >50)
LDL CALC: 67 mg/dL (ref <100)
TRIGLYCERIDES: 76 mg/dL (ref <150)
Total CHOL/HDL Ratio: 2.1 Ratio (ref <5.0)
VLDL: 15 mg/dL (ref <30)

## 2016-11-18 LAB — HEPATIC FUNCTION PANEL
ALBUMIN: 4.6 g/dL (ref 3.6–5.1)
ALK PHOS: 95 U/L (ref 33–130)
ALT: 21 U/L (ref 6–29)
AST: 24 U/L (ref 10–35)
BILIRUBIN INDIRECT: 1.4 mg/dL — AB (ref 0.2–1.2)
BILIRUBIN TOTAL: 1.7 mg/dL — AB (ref 0.2–1.2)
Bilirubin, Direct: 0.3 mg/dL — ABNORMAL HIGH (ref <=0.2)
Total Protein: 7.5 g/dL (ref 6.1–8.1)

## 2016-11-18 LAB — IRON AND TIBC
%SAT: 34 % (ref 11–50)
Iron: 151 ug/dL (ref 45–160)
TIBC: 441 ug/dL (ref 250–450)
UIBC: 290 ug/dL

## 2016-11-18 LAB — TSH: TSH: 0.55 mIU/L

## 2016-11-19 LAB — VITAMIN D 25 HYDROXY (VIT D DEFICIENCY, FRACTURES): Vit D, 25-Hydroxy: 38 ng/mL (ref 30–100)

## 2016-11-19 LAB — HEMOGLOBIN A1C
Hgb A1c MFr Bld: 5.6 % (ref <5.7)
Mean Plasma Glucose: 114 mg/dL

## 2016-11-19 LAB — MICROALBUMIN / CREATININE URINE RATIO
Creatinine, Urine: 131 mg/dL (ref 20–320)
MICROALB UR: 0.3 mg/dL
Microalb Creat Ratio: 2 mcg/mg creat (ref <30)

## 2016-11-19 LAB — URINALYSIS, ROUTINE W REFLEX MICROSCOPIC
Bilirubin Urine: NEGATIVE
GLUCOSE, UA: NEGATIVE
HGB URINE DIPSTICK: NEGATIVE
Ketones, ur: NEGATIVE
LEUKOCYTES UA: NEGATIVE
NITRITE: NEGATIVE
PH: 5.5 (ref 5.0–8.0)
Protein, ur: NEGATIVE
SPECIFIC GRAVITY, URINE: 1.012 (ref 1.001–1.035)

## 2016-11-19 LAB — PTH, INTACT AND CALCIUM
CALCIUM: 10.6 mg/dL — AB (ref 8.6–10.4)
PTH: 84 pg/mL — ABNORMAL HIGH (ref 14–64)

## 2016-11-19 LAB — VITAMIN B12: Vitamin B-12: 966 pg/mL (ref 200–1100)

## 2016-11-19 LAB — MAGNESIUM: Magnesium: 2.2 mg/dL (ref 1.5–2.5)

## 2016-11-19 LAB — INSULIN, RANDOM: INSULIN: 4.5 u[IU]/mL (ref 2.0–19.6)

## 2016-11-21 ENCOUNTER — Encounter: Payer: Self-pay | Admitting: Internal Medicine

## 2016-11-30 ENCOUNTER — Other Ambulatory Visit: Payer: Self-pay | Admitting: Internal Medicine

## 2016-11-30 DIAGNOSIS — E611 Iron deficiency: Secondary | ICD-10-CM

## 2016-11-30 HISTORY — DX: Iron deficiency: E61.1

## 2016-12-17 ENCOUNTER — Other Ambulatory Visit: Payer: Self-pay | Admitting: *Deleted

## 2016-12-17 MED ORDER — ROSUVASTATIN CALCIUM 20 MG PO TABS: ORAL_TABLET | ORAL | 1 refills | Status: DC

## 2016-12-18 DIAGNOSIS — H401131 Primary open-angle glaucoma, bilateral, mild stage: Secondary | ICD-10-CM | POA: Diagnosis not present

## 2016-12-23 ENCOUNTER — Ambulatory Visit: Payer: Self-pay | Admitting: Internal Medicine

## 2017-01-02 ENCOUNTER — Other Ambulatory Visit: Payer: Self-pay | Admitting: Internal Medicine

## 2017-01-02 DIAGNOSIS — I10 Essential (primary) hypertension: Secondary | ICD-10-CM

## 2017-01-13 ENCOUNTER — Other Ambulatory Visit: Payer: Self-pay | Admitting: *Deleted

## 2017-01-13 DIAGNOSIS — I10 Essential (primary) hypertension: Secondary | ICD-10-CM

## 2017-01-13 MED ORDER — BISOPROLOL FUMARATE 10 MG PO TABS: ORAL_TABLET | ORAL | 1 refills | Status: DC

## 2017-02-06 NOTE — Progress Notes (Deleted)
CLINIC:  Survivorship   REASON FOR VISIT:  Routine follow-up for history of breast cancer.   BRIEF ONCOLOGIC HISTORY:  (1) status post left breast biopsy in March 2011 for a high-grade invasive ductal carcinoma, with biopsy-proven axillary lymph node involvement at presentation. ER/PR positive, HER2 negative, with MIB-1 of 56%.   (2) received neoadjuvant chemotherapy with docetaxel, doxorubicin and cyclophosphamide x4, discontinued due to peripheral neuropathy.  (3) received 1 cycle of carboplatin and gemcitabine, also stopped due to worsening neuropathy.   (4) status post bilateral mastectomies and left axillary lymph node dissection in August 2011, the result showing a complete pathologic response in both the breast and the lymph nodes  (5) completed radiation therapy November 2011  (6) started letrozole November 2011; normal bone density 01/13/2013; completed in 04/2015  (7) elevated PTH and calcium but no localizable adenoma, with history of remote parathyroidectomy  (8) bilateral solitary thyroid nodules, status post right thyroid nodule biopsy April 2014 consistent with benign goiter.  (9) left-sided chest wall lymphedema   INTERVAL HISTORY:  Ms. Reagor presents to [facility] today for routine follow-up for her history of breast cancer.  Overall, she reports feeling quite well. ***    REVIEW OF SYSTEMS:  Review of Systems - Oncology Breast: Denies any new nodularity, masses, tenderness, nipple changes, or nipple discharge.       PAST MEDICAL/SURGICAL HISTORY:  Past Medical History:  Diagnosis Date  . Breast cancer ([place])   . Chest tightness   . GERD (gastroesophageal reflux disease)   . Hyperlipidemia   . Hypertension   . Palpitations   . Prediabetes   . Vitamin D deficiency    Past Surgical History:  Procedure Laterality Date  . MASTECTOMY  August 2011   Bilateral  . NASAL SINUS SURGERY    . PARATHYROIDECTOMY       ALLERGIES:    Allergies  Allergen Reactions  . Minocycline   . Prednisone Swelling  . Sulfa Antibiotics     Patient does not remember the type of reaction, happened years ago.  . Zithromax [Azithromycin] Swelling  . Ciprofloxacin Hcl Rash  . Penicillins Rash     CURRENT MEDICATIONS:  Outpatient Encounter Prescriptions as of 02/07/2017  Medication Sig Note  . aspirin 81 MG tablet Take 81 mg by mouth daily.    . bisoprolol (ZEBETA) 10 MG tablet TAKE 1 TABLET BY MOUTH EVERY DAY FOR BLOOD PRESSURE   . Cholecalciferol (VITAMIN D PO) Take 2,000 Units by mouth daily.    . Dorzolamide HCl-Timolol Mal PF 22.3-6.8 MG/ML SOLN Apply 1 drop to eye 2 (two) times daily. Left eye   . fluticasone (FLONASE) 50 MCG/ACT nasal spray INSTILL 1 SPRAY INTO EACH NOSTRIL TWICE A DAY   . latanoprost (XALATAN) 0.005 % ophthalmic solution Place 1 drop into both eyes at bedtime.   Marland Kitchen letrozole (FEMARA) 2.5 MG tablet letrozole 2.5 mg tablet   . levofloxacin (LEVAQUIN) 500 MG tablet Take 1 tablet (500 mg total) by mouth daily.   Marland Kitchen OVER THE COUNTER MEDICATION daily. B-Complex with 1000 mcg Biotin   . quinapril (ACCUPRIL) 20 MG tablet Take 1 tablet (20 mg total) by mouth daily. 11/18/2016: Takes 1/2 tablet daily  . rosuvastatin (CRESTOR) 20 MG tablet take 1 tablet by mouth at bedtime for cholesterol    No facility-administered encounter medications on file as of 02/07/2017.      ONCOLOGIC FAMILY HISTORY:  Family History  Problem Relation Age of Onset  . Diabetes Mother   .  Cancer Mother        breast    GENETIC COUNSELING/TESTING: ***  SOCIAL HISTORY:  Mallorey Odonell Goldberg is /single/married/divorced/widowed/separated and lives alone/with her spouse/family/friend in (city), [place].  She has (#) children and they live in (city).  Ms. Wilkowski is currently retired/disabled/working part-time/full-time as ***.  She denies any current or history of tobacco, alcohol, or illicit drug use.     PHYSICAL EXAMINATION:  Vital  Signs: There were no vitals filed for this visit. There were no vitals filed for this visit. General: Well-nourished, well-appearing female in no acute distress.  Unaccompanied/Accompanied by***** today.   HEENT: Head is normocephalic.  Pupils equal and reactive to light. Conjunctivae clear without exudate.  Sclerae anicteric. Oral mucosa is pink, moist.  Oropharynx is pink without lesions or erythema.  Lymph: No cervical, supraclavicular, or infraclavicular lymphadenopathy noted on palpation.  Cardiovascular: Regular rate and rhythm.Marland Kitchen Respiratory: Clear to auscultation bilaterally. Chest expansion symmetric; breathing non-labored.  Breast Exam:  -Left breast: No appreciable masses on palpation. No skin redness, thickening, or peau d'orange appearance; no nipple retraction or nipple discharge; mild distortion in symmetry at previous lumpectomy site***healed scar without erythema or nodularity.  -Right breast: No appreciable masses on palpation. No skin redness, thickening, or peau d'orange appearance; no nipple retraction or nipple discharge; mild distortion in symmetry at previous lumpectomy site***healed scar without erythema or nodularity. -Axilla: No axillary adenopathy bilaterally.  GI: Abdomen soft and round; non-tender, non-distended. Bowel sounds normoactive. No hepatosplenomegaly.   GU: Deferred.  Neuro: No focal deficits. Steady gait.  Psych: Mood and affect normal and appropriate for situation.  MSK: No focal spinal tenderness to palpation, full range of motion in bilateral upper extremities Extremities: No edema. Skin: Warm and dry.  LABORATORY DATA:  None for this visit   DIAGNOSTIC IMAGING:  Most recent mammogram: n/a status post bilateral mastectomies    ASSESSMENT AND PLAN:  Ms.. Petillo is a pleasant 71 y.o. female with history of left breast invasive ductal carcinoma, ER+/PR+/HER2-, diagnosed in 08/2009, treated with neoadjuvant chemotherapy, bilateral mastectomies,  adjuvant radiation therapy, and anti-estrogen therapy with Letrozole x 5 years, completed in 04/2015.  [facility] and routine follow-up.   1. History of breast cancer:  Ms. Tritch is currently clinically and radiographically without evidence of disease or recurrence of breast cancer.  I encouraged her to call me with any questions or concerns before her next visit at the cancer center, and I would be happy to see her sooner, if needed.    #. Problem(s) at Visit___________________.  #. Bone health:  Given Ms. Riddle's age, history of breast cancer, and her current anti-estrogen therapy with ________, she is at risk for bone demineralization. Her last DEXA scan was on **/**/20**.  In the meantime, she was encouraged to increase her consumption of foods rich in calcium, as well as increase her weight-bearing activities.  She was given education on specific food and activities to promote bone health.  #. Cancer screening:  Due to Ms. Wiedeman's history and her age, she should receive screening for skin cancers, colon cancer, and ***gynecologic cancers. She was encouraged to follow-up with her PCP for appropriate cancer screenings.   #. Health maintenance and wellness promotion: Ms. Bullis was encouraged to consume 5-7 servings of fruits and vegetables per day. She was also encouraged to engage in moderate to vigorous exercise for 30 minutes per day most days of the week. She was instructed to limit her  alcohol consumption and continue to abstain from tobacco use/was encouraged stop smoking.  ***    Dispo:  -Return to cancer center ***   A total of (30) minutes of face-to-face time was spent with this patient with greater than 50% of that time in counseling and care-coordination.   Gardenia Phlegm, [place] [phone]   Note: PRIMARY CARE PROVIDER Unk Pinto, [place] [phone]

## 2017-02-07 ENCOUNTER — Encounter: Payer: 59 | Admitting: Adult Health

## 2017-02-13 DIAGNOSIS — Z01419 Encounter for gynecological examination (general) (routine) without abnormal findings: Secondary | ICD-10-CM | POA: Diagnosis not present

## 2017-02-24 NOTE — Progress Notes (Deleted)
MEDICARE ANNUAL WELLNESS VISIT  Assessment:    Over 40 minutes of exam, counseling, chart review and critical decision making was performed Future Appointments Date Time Provider [place]  02/25/2017 11:00 AM Vicie Mutters, PA-C [facility] None  05/28/2017 10:30 AM Unk Pinto, MD [facility] None  12/04/2017 9:00 AM Unk Pinto, MD [facility] None     Plan:   During the course of the visit the patient was educated and counseled about appropriate screening and preventive services including:    Pneumococcal vaccine   Prevnar 13  Influenza vaccine  Td vaccine  Screening electrocardiogram  Bone densitometry screening  Colorectal cancer screening  Diabetes screening  Glaucoma screening  Nutrition counseling   Advanced directives: requested   Subjective:  Shannon Nielsen is a 71 y.o. female who presents for Medicare Annual Wellness Visit and complete physical.    Her blood pressure {HAS HAS NOT:18834} been controlled at home, today their BP is    She {DOES_DOES HWE:99371} workout. She denies chest pain, shortness of breath, dizziness.  She {ACTION; IS/IS IRC:78938101} on cholesterol medication and denies myalgias. Her cholesterol {ACTION; IS/IS NOT:21021397} at goal. The cholesterol last visit was:   Lab Results  Component Value Date   CHOL 159 11/18/2016   HDL 77 11/18/2016   LDLCALC 67 11/18/2016   TRIG 76 11/18/2016   CHOLHDL 2.1 11/18/2016    She {Has/has not:18111} been working on diet and exercise for prediabetes, and denies {Symptoms; diabetes w/o none:19199}. Last A1C in the office was:  Lab Results  Component Value Date   HGBA1C 5.6 11/18/2016   Last GFR: Lab Results  Component Value Date   GFRNONAA 52 (L) 11/18/2016   Lab Results  Component Value Date   GFRAA 60 11/18/2016   Patient is on Vitamin D supplement.   Lab Results  Component Value Date   VD25OH 38 11/18/2016      Medication Review: Current Outpatient  Prescriptions on File Prior to Visit  Medication Sig Dispense Refill  . aspirin 81 MG tablet Take 81 mg by mouth daily.     . bisoprolol (ZEBETA) 10 MG tablet TAKE 1 TABLET BY MOUTH EVERY DAY FOR BLOOD PRESSURE 90 tablet 1  . Cholecalciferol (VITAMIN D PO) Take 2,000 Units by mouth daily.     . Dorzolamide HCl-Timolol Mal PF 22.3-6.8 MG/ML SOLN Apply 1 drop to eye 2 (two) times daily. Left eye    . fluticasone (FLONASE) 50 MCG/ACT nasal spray INSTILL 1 SPRAY INTO EACH NOSTRIL TWICE A DAY 16 g 3  . latanoprost (XALATAN) 0.005 % ophthalmic solution Place 1 drop into both eyes at bedtime.    Marland Kitchen letrozole (FEMARA) 2.5 MG tablet letrozole 2.5 mg tablet    . levofloxacin (LEVAQUIN) 500 MG tablet Take 1 tablet (500 mg total) by mouth daily. 10 tablet 0  . OVER THE COUNTER MEDICATION daily. B-Complex with 1000 mcg Biotin    . quinapril (ACCUPRIL) 20 MG tablet Take 1 tablet (20 mg total) by mouth daily. 90 tablet 1  . rosuvastatin (CRESTOR) 20 MG tablet take 1 tablet by mouth at bedtime for cholesterol 90 tablet 1   No current facility-administered medications on file prior to visit.     Allergies  Allergen Reactions  . Minocycline   . Prednisone Swelling  . Sulfa Antibiotics     Patient does not remember the type of reaction, happened years ago.  . Zithromax [Azithromycin] Swelling  . Ciprofloxacin Hcl Rash  . Penicillins Rash    Current  Problems (verified) Patient Active Problem List   Diagnosis Date Noted  . Iron deficiency 11/30/2016  . Body mass index (BMI) of 21.0-21.9 in adult 05/08/2015  . Open-angle glaucoma 05/08/2015  . Medication management 08/30/2013  . Prediabetes   . Vitamin D deficiency   . GERD (gastroesophageal reflux disease)   . Hyperparathyroidism, primary ([place]) 08/12/2012  . Breast cancer of upper-outer quadrant of left female breast ([place]) 07/08/2011  . Hyperlipidemia 10/18/2010  . Essential hypertension 10/18/2010    Screening Tests Immunization History   Administered Date(s) Administered  . Influenza, High Dose Seasonal PF 03/07/2014, 05/08/2015  . PPD Test 08/30/2013, 10/04/2014, 10/24/2015  . Pneumococcal Conjugate-13 03/24/2014  . Pneumococcal Polysaccharide-23 09/08/2006  . Tdap 08/27/2012  . Zoster 09/03/2012   Preventative care: Last colonoscopy: *** Last mammogram: *** Last pap smear/pelvic exam: ***   DEXA:***  Prior vaccinations: TD or Tdap: 2014  Influenza: 2017 Pneumococcal: 2008 Prevnar13: 2015 Shingles/Zostavax: 2014  Names of Other Physician/Practitioners you currently use: 1. [facility] here for primary care 2. ***, eye doctor, last visit *** 3. ***, dentist, last visit *** Patient Care Team: Unk Pinto, MD as PCP - General (Internal Medicine) Meda Klinefelter, MD as Consulting Physician (Endocrinology) Mordecai Rasmussen, MD as Consulting Physician (Surgery)  SURGICAL HISTORY She  has a past surgical history that includes Mastectomy (August 2011); Parathyroidectomy; and Nasal sinus surgery. FAMILY HISTORY Her family history includes Cancer in her mother; Diabetes in her mother. SOCIAL HISTORY She  reports that she has never smoked. She has never used smokeless tobacco. She reports that she does not drink alcohol or use drugs.   MEDICARE WELLNESS OBJECTIVES: Physical activity:   Cardiac risk factors:   Depression/mood screen:   Depression screen [facility] 2/9 11/18/2016  Decreased Interest 0  Down, Depressed, Hopeless 0  PHQ - 2 Score 0    ADLs:  In your present state of health, do you have any difficulty performing the following activities: 11/18/2016 05/07/2016  Hearing? N N  Vision? N N  Difficulty concentrating or making decisions? N N  Walking or climbing stairs? N N  Dressing or bathing? N N  Doing errands, shopping? N N  Some recent data might be hidden     Cognitive Testing  Alert? Yes  Normal Appearance?Yes  Oriented to person? Yes  Place?  Yes   Time? Yes  Recall of three objects?  Yes  Can perform simple calculations? Yes  Displays appropriate judgment?Yes  Can read the correct time from a watch face?Yes  EOL planning:    ROS   Objective:     There were no vitals filed for this visit. There is no height or weight on file to calculate BMI.  General appearance: alert, no distress, WD/WN, female HEENT: normocephalic, sclerae anicteric, TMs pearly, nares patent, no discharge or erythema, pharynx normal Oral cavity: MMM, no lesions Neck: supple, no lymphadenopathy, no thyromegaly, no masses Heart: RRR, normal S1, S2, no murmurs Lungs: CTA bilaterally, no wheezes, rhonchi, or rales Abdomen: +bs, soft, non tender, non distended, no masses, no hepatomegaly, no splenomegaly Musculoskeletal: nontender, no swelling, no obvious deformity Extremities: no edema, no cyanosis, no clubbing Pulses: 2+ symmetric, upper and lower extremities, normal cap refill Neurological: alert, oriented x 3, CN2-12 intact, strength normal upper extremities and lower extremities, sensation normal throughout, DTRs 2+ throughout, no cerebellar signs, gait normal Psychiatric: normal affect, behavior normal, pleasant   Medicare Attestation I have personally reviewed: The patient's medical and  social history Their use of alcohol, tobacco or illicit drugs Their current medications and supplements The patient's functional ability including ADLs,fall risks, home safety risks, cognitive, and hearing and visual impairment Diet and physical activities Evidence for depression or mood disorders  The patient's weight, height, BMI, and visual acuity have been recorded in the chart.  I have made referrals, counseling, and provided education to the patient based on review of the above and I have provided the patient with a written personalized care plan for preventive services.     Vicie Mutters, PA-C   02/24/2017

## 2017-02-25 ENCOUNTER — Ambulatory Visit: Payer: Self-pay | Admitting: Physician Assistant

## 2017-03-01 IMAGING — US US SOFT TISSUE HEAD/NECK
1 series · 12 of 25 positions shown · non-contrast
Comparison: 08/04/2015

ADDENDUM:
Parathyroid tissue is not identified.

These results will be called to the ordering clinician or
representative by the Radiologist Assistant, and communication
documented in the PACS or zVision Dashboard.
CLINICAL DATA: Prior ultrasound follow-up. Multinodular goiter.
Hyperparathyroidism. Bilateral nodules biopsied on 09/15/2012.
EXAM:
THYROID ULTRASOUND
TECHNIQUE: Ultrasound examination of the thyroid gland and adjacent soft
tissues was performed.

[Series 1: us soft tissue head/neck · 0.07mm/px · 12 of 72 slices shown]
[im 3/72]
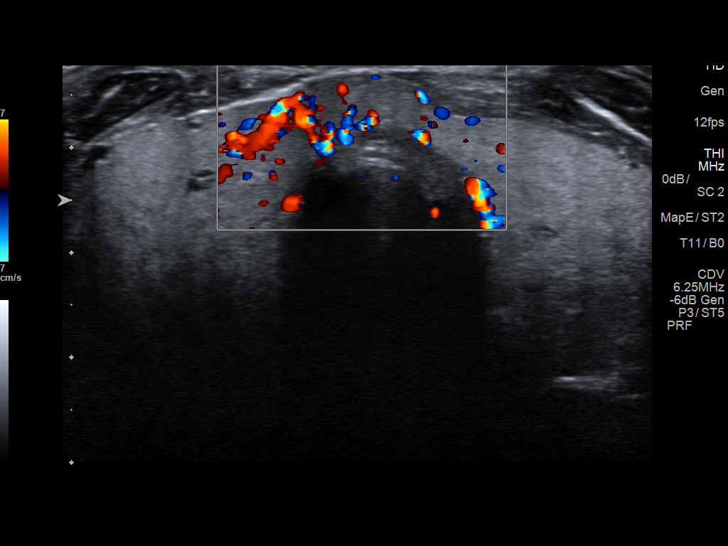
[im 9/72]
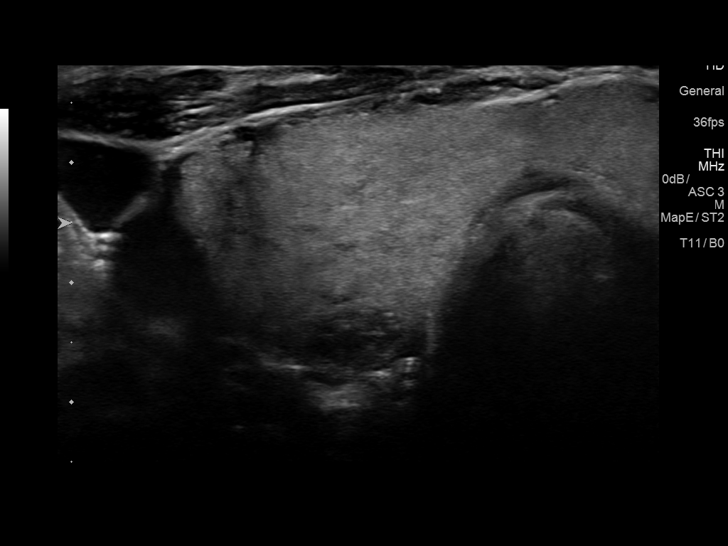
[im 15/72]
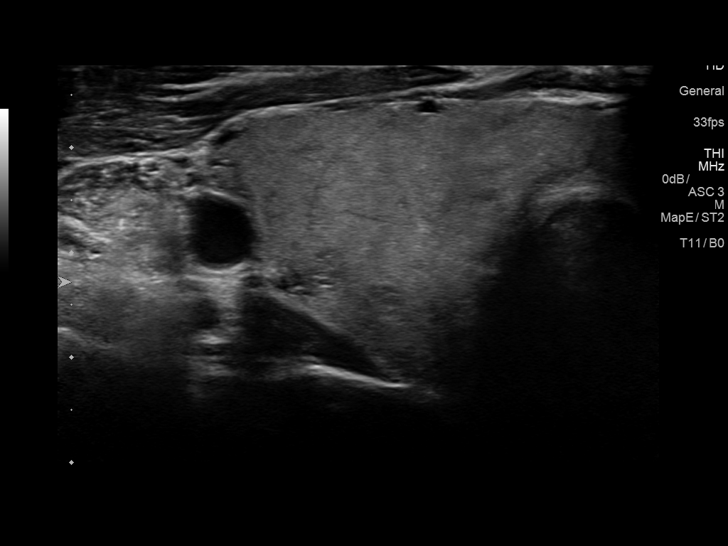
[im 21/72]
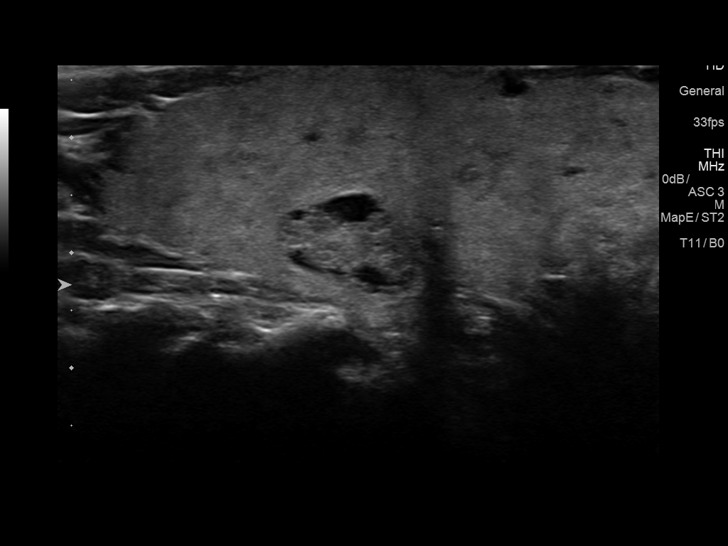
[im 27/72]
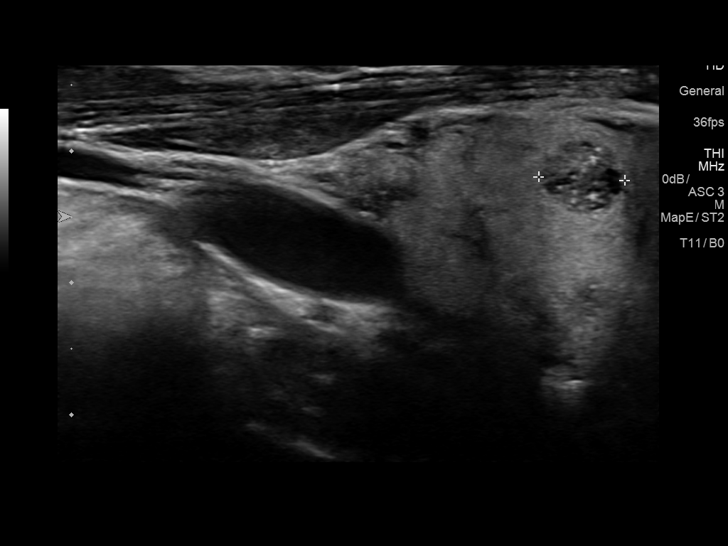
[im 33/72]
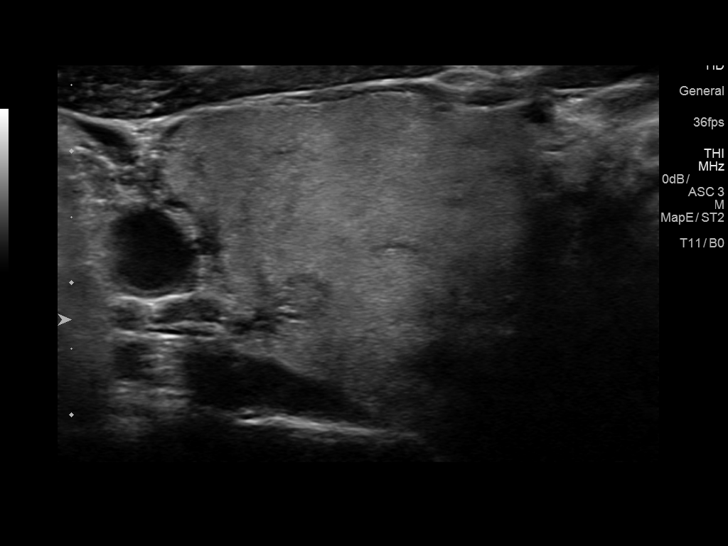
[im 39/72]
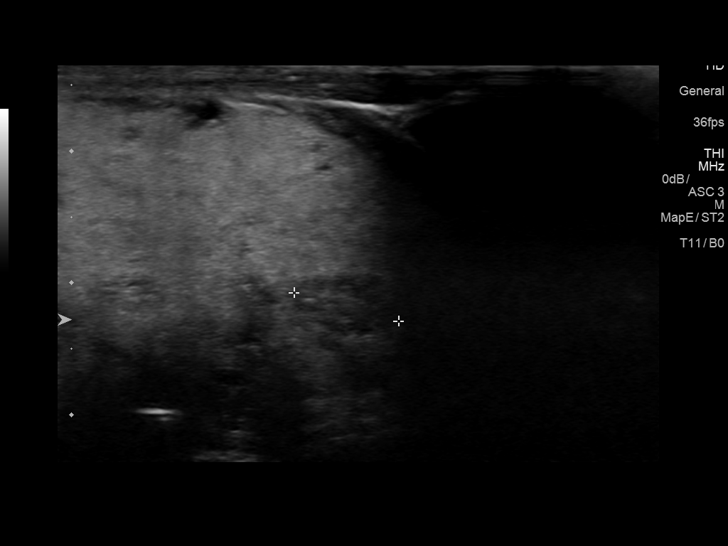
[im 45/72]
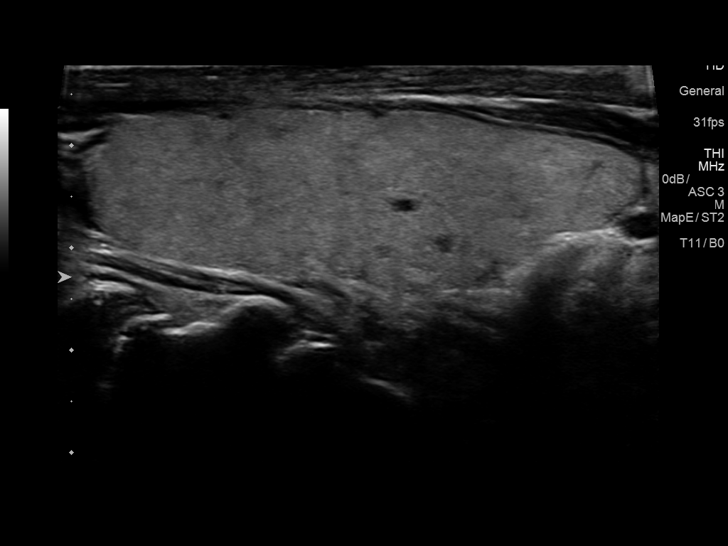
[im 51/72]
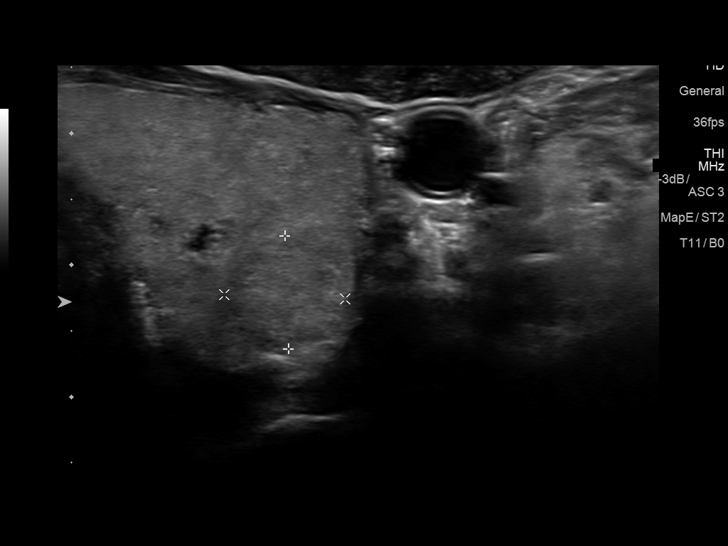
[im 57/72]
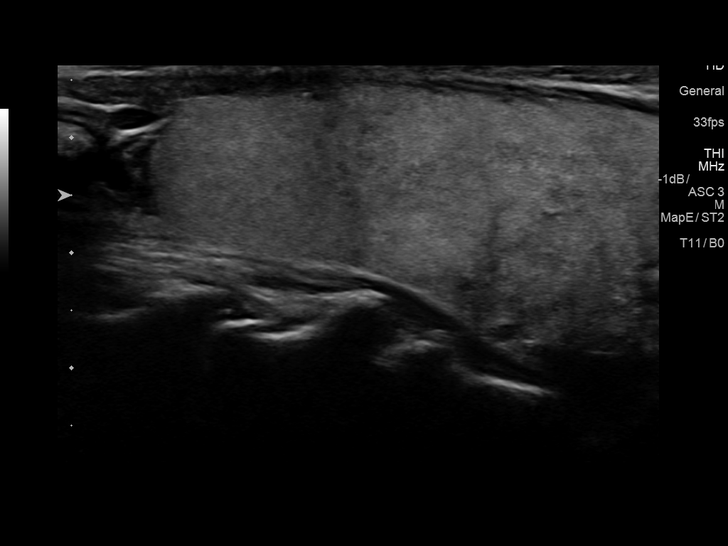
[im 63/72]
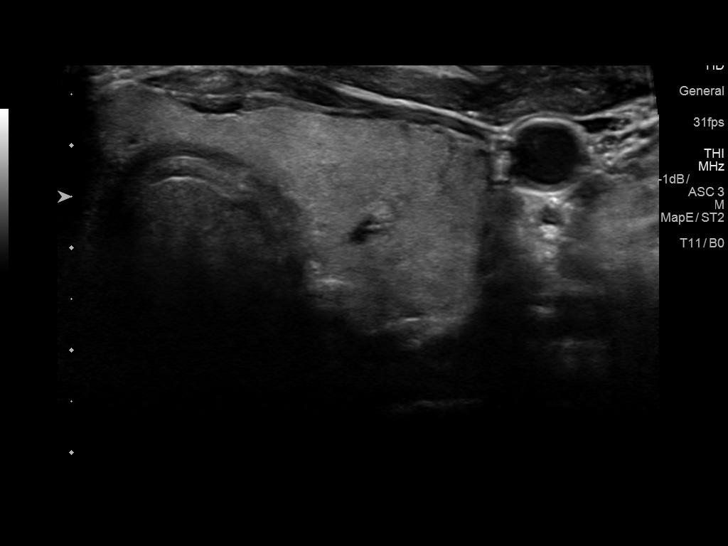
[im 69/72]
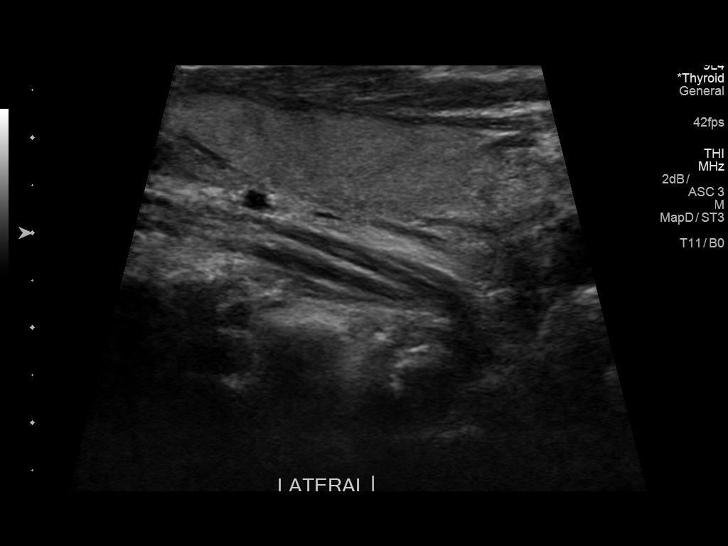

[12 of 25 positions shown; findings below may reference images not displayed]

FINDINGS: Parenchymal Echotexture: Mildly heterogenous

Isthmus: Measures 0.7 cm AP dimension and previously measured
cm.

Right lobe: 6.3 x 2.4 x 2.6 cm and previously measures 6.8 x 3.0 x
3.2 cm

Left lobe: 5.5 x 1.7 x 2.0 cm and previously measured 5.7 x 0.9 x
1.7 cm

_________________________________________________________

Estimated total number of nodules >/= 1 cm: 2

Number of spongiform nodules >/=  2 cm not described below (TR1): 0

Number of mixed cystic and solid nodules >/= 1.5 cm not described
below (TR2): 0

There are numerous nodules and cystic structures throughout the
right thyroid lobe. A mildly complex nodule in the posterior mid
right thyroid lobe measures 1.3 x 0.8 x 1.3 cm and previously
measured 1.5 x 0.8 x 1.5 cm. This appears to represent the biopsied
nodule in 0942. The other solid nodules measure less than 1 cm in
size and do not meet criteria for dedicated follow up or biopsy
except for the nodule discussed below and labeled as Nodule #1.

Again noted is a ill-defined isoechoic nodule in the posterior mid
left thyroid lobe. This nodule measures 1.2 x 0.9 x 0.9 cm and
previously this nodule measured 1.2 x 0.9 x 1.0 cm. No suspicious
echogenic foci in this isoechoic nodule. This appears to represent
the nodule that was biopsied in 0942. At the time of the biopsy, the
nodule is more hypoechoic. Few additional small nodules or cysts in
left thyroid lobe that do not meet criteria for biopsy or dedicated
follow-up.

Nodule # 1:

Location: Right; Mid

Maximum size: 0.7 cm; Other 2 dimensions: 0.5 x 0.5 cm. This nodule
was not clearly present on the previous examination.

Composition: cannot determine (2)

Echogenicity: hypoechoic (2)

Shape: not taller-than-wide (0)

Margins: ill-defined (0)

Echogenic foci: punctate echogenic foci (3)

ACR TI-RADS total points: 7.

ACR TI-RADS risk category: TR5 (>/= 7 points).

ACR TI-RADS recommendations:

*Given size (>/= 0.5 - 0.9 cm) and appearance, a follow-up
ultrasound in 1 year should be considered based on TI-RADS criteria.
IMPRESSION: Multinodular goiter.

The bilateral dominant nodules, which have been previously biopsied,
are essentially unchanged since 4213.

There appears to be a new complex nodule in the mid right thyroid
lobe with some echogenic foci. This nodule measures up to 0.7 cm and
recommend 1 year follow-up.

The above is in keeping with the ACR TI-RADS recommendations - [HOSPITAL] 4213;[DATE].

## 2017-03-08 NOTE — Progress Notes (Signed)
MEDICARE ANNUAL WELLNESS VISIT  Assessment:   Essential hypertension - continue medications, DASH diet, exercise and monitor at home. Call if greater than 130/80.  -     CBC with Differential/Platelet -     BASIC METABOLIC PANEL WITH GFR -     Hepatic function panel -     TSH  Hyperparathyroidism, primary (HCC) Monitor  Hyperlipidemia -continue medications, check lipids, decrease fatty foods, increase activity.  -     Lipid panel  Malignant neoplasm of upper-outer quadrant of left breast in female, estrogen receptor positive ([place]) Monitor  Prediabetes Discussed general issues about diabetes pathophysiology and management., Educational material distributed., Suggested low cholesterol diet., Encouraged aerobic exercise., Discussed foot care., Reminded to get yearly retinal exam. -     Hemoglobin A1c  Medication management  Vitamin D deficiency Continue supplement  Body mass index (BMI) of 21.0-21.9 in adult Add protein, work on Editor, commissioning  Open-angle glaucoma of right eye, unspecified glaucoma stage, unspecified open-angle glaucoma type Continue follow up  Iron deficiency - monitor, continue iron supp with Vitamin C and increase green leafy veggies  Gastroesophageal reflux disease, esophagitis presence not specified Continue PPI/H2 blocker, diet discussed  Needs flu shot -     Flu vaccine HIGH DOSE PF  Need for prophylactic vaccination against Streptococcus pneumoniae (pneumococcus) -     Pneumococcal polysaccharide vaccine 23-valent greater than or equal to 2yo subcutaneous/IM  Over 40 minutes of exam, counseling, chart review and critical decision making was performed Future Appointments Date Time Provider [place]  05/28/2017 10:30 AM Unk Pinto, MD [facility] None  12/04/2017 9:00 AM Unk Pinto, MD [facility] None     Plan:   During the course of the visit the patient was educated and counseled about appropriate screening and  preventive services including:    Pneumococcal vaccine   Prevnar 13  Influenza vaccine  Td vaccine  Screening electrocardiogram  Bone densitometry screening  Colorectal cancer screening  Diabetes screening  Glaucoma screening  Nutrition counseling   Advanced directives: requested   Subjective:  Shannon Nielsen is a 71 y.o. female who presents for Medicare Annual Wellness Visit and complete physical.    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 She denies chest pain, shortness of breath, dizziness.  She is on cholesterol medication and denies myalgias. Her cholesterol is at goal. The cholesterol last visit was:   Lab Results  Component Value Date   CHOL 159 11/18/2016   HDL 77 11/18/2016   LDLCALC 67 11/18/2016   TRIG 76 11/18/2016   CHOLHDL 2.1 11/18/2016   She has a history of preDM but has decreased sugar and has increased water. Last A1C in the office was:  Lab Results  Component Value Date   HGBA1C 5.6 11/18/2016   Last GFR: Lab Results  Component Value Date   GFRAA 60 11/18/2016   Patient is on Vitamin D supplement.   Lab Results  Component Value Date   VD25OH 38 11/18/2016     BMI is Body mass index is 20.11 kg/m., she is working on diet and exercise. Wt Readings from Last 3 Encounters:  03/11/17 124 lb 9.6 oz (56.5 kg)  11/18/16 125 lb (56.7 kg)  08/08/16 132 lb (59.9 kg)    Medication Review: Current Outpatient Prescriptions on File Prior to Visit  Medication Sig Dispense Refill  .  aspirin 81 MG tablet Take 81 mg by mouth daily.     . bisoprolol (ZEBETA) 10 MG tablet TAKE 1 TABLET BY MOUTH EVERY DAY FOR BLOOD PRESSURE 90 tablet 1  . Cholecalciferol (VITAMIN D PO) Take 2,000 Units by mouth daily.     . Dorzolamide HCl-Timolol Mal PF 22.3-6.8 MG/ML SOLN Apply 1 drop to eye 2 (two) times daily. Left eye    .  fluticasone (FLONASE) 50 MCG/ACT nasal spray INSTILL 1 SPRAY INTO EACH NOSTRIL TWICE A DAY 16 g 3  . latanoprost (XALATAN) 0.005 % ophthalmic solution Place 1 drop into both eyes at bedtime.    Marland Kitchen letrozole (FEMARA) 2.5 MG tablet letrozole 2.5 mg tablet    . levofloxacin (LEVAQUIN) 500 MG tablet Take 1 tablet (500 mg total) by mouth daily. 10 tablet 0  . OVER THE COUNTER MEDICATION daily. B-Complex with 1000 mcg Biotin    . quinapril (ACCUPRIL) 20 MG tablet Take 1 tablet (20 mg total) by mouth daily. 90 tablet 1  . rosuvastatin (CRESTOR) 20 MG tablet take 1 tablet by mouth at bedtime for cholesterol 90 tablet 1   No current facility-administered medications on file prior to visit.     Allergies  Allergen Reactions  . Minocycline   . Prednisone Swelling  . Sulfa Antibiotics     Patient does not remember the type of reaction, happened years ago.  . Zithromax [Azithromycin] Swelling  . Ciprofloxacin Hcl Rash  . Penicillins Rash    Current Problems (verified) Patient Active Problem List   Diagnosis Date Noted  . Iron deficiency 11/30/2016  . Body mass index (BMI) of 21.0-21.9 in adult 05/08/2015  . Open-angle glaucoma 05/08/2015  . Medication management 08/30/2013  . Prediabetes   . Vitamin D deficiency   . GERD (gastroesophageal reflux disease)   . Hyperparathyroidism, primary ([place]) 08/12/2012  . Breast cancer of upper-outer quadrant of left female breast ([place]) 07/08/2011  . Hyperlipidemia 10/18/2010  . Essential hypertension 10/18/2010    Screening Tests Immunization History  Administered Date(s) Administered  . Influenza, High Dose Seasonal PF 03/07/2014, 05/08/2015, 03/11/2017  . PPD Test 08/30/2013, 10/04/2014, 10/24/2015  . Pneumococcal Conjugate-13 03/24/2014  . Pneumococcal Polysaccharide-23 09/08/2006, 03/11/2017  . Tdap 08/27/2012  . Zoster 09/03/2012   Preventative care: Last colonoscopy: 2014 Last mammogram: 2011 s/p double mastectomy Last pap smear/pelvic  exam: 2015 declines another DEXA:04/2016 osteopenia Stress test 2012  Prior vaccinations: TD or Tdap: 2014  Influenza: 2017 TODAY Pneumococcal: 2008 TODAY Prevnar13: 2015 Shingles/Zostavax: 2014  Names of Other Physician/Practitioners you currently use: 1. [facility] here for primary care 2. [facility], eye doctor, last visit 01/2017, q 3 months 3. Dr. Lilian Coma dentist, last visit q 6 months Patient Care Team: Unk Pinto, MD as PCP - General (Internal Medicine) Meda Klinefelter, MD as Consulting Physician (Endocrinology) Mordecai Rasmussen, MD as Consulting Physician (Surgery)  SURGICAL HISTORY She  has a past surgical history that includes Mastectomy (August 2011); Parathyroidectomy; and Nasal sinus surgery. FAMILY HISTORY Her family history includes Cancer in her mother; Diabetes in her mother. SOCIAL HISTORY She  reports that she has never smoked. She has never used smokeless tobacco. She reports that she does not drink alcohol or use drugs.   MEDICARE WELLNESS OBJECTIVES: Physical activity: Current Exercise Habits: The patient does not participate in regular exercise at present Cardiac risk factors: Cardiac Risk Factors include: advanced age (>52men, >4 women);dyslipidemia;hypertension;sedentary lifestyle Depression/mood screen:   Depression screen [facility] 2/9 11/18/2016  Decreased Interest 0  Down, Depressed, Hopeless 0  PHQ - 2 Score 0    ADLs:  In your present state of health, do you have any difficulty performing the following activities: 03/11/2017 11/18/2016  Hearing? N N  Vision? N N  Difficulty concentrating or making decisions? N N  Walking or climbing stairs? N N  Dressing or bathing? N N  Doing errands, shopping? N N  Preparing Food and eating ? N -  Using the Toilet? N -  In the past six months, have you accidently leaked urine? N -  Do you have problems with loss of bowel control? N -  Managing your Medications?  N -  Managing your Finances? N -  Housekeeping or managing your Housekeeping? N -  Some recent data might be hidden     Cognitive Testing  Alert? Yes  Normal Appearance?Yes  Oriented to person? Yes  Place? Yes   Time? Yes  Recall of three objects?  Yes  Can perform simple calculations? Yes  Displays appropriate judgment?Yes  Can read the correct time from a watch face?Yes  EOL planning: Does Patient Have a Medical Advance Directive?: No Would patient like information on creating a medical advance directive?: Yes (MAU/Ambulatory/Procedural Areas - Information given)  Review of Systems  Constitutional: Negative.   HENT: Negative.   Eyes: Negative.   Respiratory: Negative.   Cardiovascular: Negative.   Gastrointestinal: Negative.   Genitourinary: Negative.   Musculoskeletal: Negative.   Skin: Negative.   Neurological: Negative.   Endo/Heme/Allergies: Negative.   Psychiatric/Behavioral: Negative.      Objective:     Today's Vitals   03/11/17 1417  BP: 128/80  Pulse: 68  Resp: 16  Temp: 97.9 F (36.6 C)  SpO2: 95%  Weight: 124 lb 9.6 oz (56.5 kg)  Height: 5\' 6"  (1.676 m)  PainSc: 0-No pain   Body mass index is 20.11 kg/m.  General appearance: alert, no distress, WD/WN, female HEENT: normocephalic, sclerae anicteric, TMs pearly, nares patent, no discharge or erythema, pharynx normal Oral cavity: MMM, no lesions Neck: supple, no lymphadenopathy, no thyromegaly, no masses Heart: RRR, normal S1, S2, no murmurs Lungs: CTA bilaterally, no wheezes, rhonchi, or rales Abdomen: +bs, soft, non tender, non distended, no masses, no hepatomegaly, no splenomegaly Musculoskeletal: nontender, no swelling, no obvious deformity Extremities: no edema, no cyanosis, no clubbing Pulses: 2+ symmetric, upper and lower extremities, normal cap refill Neurological: alert, oriented x 3, CN2-12 intact, strength normal upper extremities and lower extremities, sensation normal throughout,  DTRs 2+ throughout, no cerebellar signs, gait normal Psychiatric: normal affect, behavior normal, pleasant   Medicare Attestation I have personally reviewed: The patient's medical and social history Their use of alcohol, tobacco or illicit drugs Their current medications and supplements The patient's functional ability including ADLs,fall risks, home safety risks, cognitive, and hearing and visual impairment Diet and physical activities Evidence for depression or mood disorders  The patient's weight, height, BMI, and visual acuity have been recorded in the chart.  I have made referrals, counseling, and provided education to the patient based on review of the above and I have provided the patient with a written personalized care plan for preventive services.     Vicie Mutters, PA-C   03/11/2017

## 2017-03-11 ENCOUNTER — Encounter: Payer: Self-pay | Admitting: Physician Assistant

## 2017-03-11 ENCOUNTER — Ambulatory Visit (INDEPENDENT_AMBULATORY_CARE_PROVIDER_SITE_OTHER): Payer: Medicare HMO | Admitting: Physician Assistant

## 2017-03-11 VITALS — BP 128/80 | HR 68 | Temp 97.9°F | Resp 16 | Ht 66.0 in | Wt 124.6 lb

## 2017-03-11 DIAGNOSIS — Z0001 Encounter for general adult medical examination with abnormal findings: Secondary | ICD-10-CM

## 2017-03-11 DIAGNOSIS — R6889 Other general symptoms and signs: Secondary | ICD-10-CM

## 2017-03-11 DIAGNOSIS — Z0000 Encounter for general adult medical examination without abnormal findings: Secondary | ICD-10-CM

## 2017-03-11 DIAGNOSIS — C50412 Malignant neoplasm of upper-outer quadrant of left female breast: Secondary | ICD-10-CM | POA: Diagnosis not present

## 2017-03-11 DIAGNOSIS — K219 Gastro-esophageal reflux disease without esophagitis: Secondary | ICD-10-CM | POA: Diagnosis not present

## 2017-03-11 DIAGNOSIS — E611 Iron deficiency: Secondary | ICD-10-CM | POA: Diagnosis not present

## 2017-03-11 DIAGNOSIS — Z6821 Body mass index (BMI) 21.0-21.9, adult: Secondary | ICD-10-CM

## 2017-03-11 DIAGNOSIS — H4010X0 Unspecified open-angle glaucoma, stage unspecified: Secondary | ICD-10-CM

## 2017-03-11 DIAGNOSIS — Z170 Estrogen receptor positive status [ER+]: Secondary | ICD-10-CM

## 2017-03-11 DIAGNOSIS — E782 Mixed hyperlipidemia: Secondary | ICD-10-CM

## 2017-03-11 DIAGNOSIS — R7303 Prediabetes: Secondary | ICD-10-CM | POA: Diagnosis not present

## 2017-03-11 DIAGNOSIS — I10 Essential (primary) hypertension: Secondary | ICD-10-CM

## 2017-03-11 DIAGNOSIS — E210 Primary hyperparathyroidism: Secondary | ICD-10-CM | POA: Diagnosis not present

## 2017-03-11 DIAGNOSIS — Z79899 Other long term (current) drug therapy: Secondary | ICD-10-CM | POA: Diagnosis not present

## 2017-03-11 DIAGNOSIS — E559 Vitamin D deficiency, unspecified: Secondary | ICD-10-CM | POA: Diagnosis not present

## 2017-03-11 DIAGNOSIS — Z23 Encounter for immunization: Secondary | ICD-10-CM | POA: Diagnosis not present

## 2017-03-11 NOTE — Patient Instructions (Signed)
Vitamin D goal is between 60-80  Please make sure that you are taking your Vitamin D as directed.   It is very important as a natural anti-inflammatory   helping hair, skin, and nails, as well as reducing stroke and heart attack risk.   It helps your bones and helps with mood.  We want you on at least 5000 IU daily  It also decreases numerous cancer risks so please take it as directed.   Low Vit D is associated with a 200-300% higher risk for CANCER   and 200-300% higher risk for HEART   ATTACK  &  STROKE.    .....................................Marland Kitchen  It is also associated with higher death rate at younger ages,   autoimmune diseases like Rheumatoid arthritis, Lupus, Multiple Sclerosis.     Also many other serious conditions, like depression, Alzheimer's  Dementia, infertility, muscle aches, fatigue, fibromyalgia - just to name a few.  +++++++++++++++++++  Can get liquid vitamin D from [facility] here in [place] at  [facility] alternatives [address], [place], [place] [address] Or you can try earth fare    Preventing Osteoporosis, Adult Osteoporosis is a condition that causes the bones to get weaker. With osteoporosis, the bones become thinner, and the normal spaces in bone tissue become larger. This can make the bones weak and cause them to break more easily. People who have osteoporosis are more likely to break their wrist, spine, or hip. Even a minor accident or injury can be enough to break weak bones. Osteoporosis can occur with aging. Your body constantly replaces old bone tissue with new tissue. As you get older, you may lose bone tissue more quickly, or it may be replaced more slowly. Osteoporosis is more likely to develop if you have poor nutrition or do not get enough calcium or vitamin D. Other lifestyle factors can also play a role. By making some diet and lifestyle changes, you can help to keep your bones healthy and help to prevent osteoporosis. What nutrition  changes can be made? Nutrition plays an important role in maintaining healthy, strong bones.  Try to get enough vitamin D every day. ? 5000 IU daily  Follow a healthy diet. Eat plenty of foods that contain calcium and vitamin D. ? Calcium is in milk, cheese, yogurt, and other dairy products. Some fish and vegetables are also good sources of calcium. Many foods such as cereals and breads have had calcium added to them (are fortified). Check nutrition labels to see how much calcium is in a food or drink. ? Foods that contain vitamin D include milk, cereals, salmon, and tuna. Your body also makes vitamin D when you are out in the sun. Bare skin exposure to the sun on your face, arms, legs, or back for no more than 30 minutes a day, 2 times per week is more than enough. Beyond that, it is important to use sunblock to protect your skin from sunburn, which increases your risk for skin cancer.  What lifestyle changes can be made? Making changes in your everyday life can also play an important role in preventing osteoporosis.  Stay active and get exercise every day. Ask your health care provider what types of exercise are best for you.  Do not use any products that contain nicotine or tobacco, such as cigarettes and e-cigarettes. If you need help quitting, ask your health care provider.  Limit alcohol intake to no more than 1 drink a day for nonpregnant women and 2 drinks a  day for men. One drink equals 12 oz of beer, 5 oz of wine, or 1 oz of hard liquor.  Why are these changes important? Making these nutrition and lifestyle changes can:  Help you develop and maintain healthy, strong bones.  Prevent loss of bone mass and the problems that are caused by that loss, such as broken bones and delayed healing.  Make you feel better mentally and physically.  What can happen if changes are not made? Problems that can result from osteoporosis can be very serious. These may include:  A higher risk of  broken bones that are painful and do not heal well.  Physical malformations, such as a collapsed spine or a hunched back.  Problems with movement.  Where to find support: If you need help making changes to prevent osteoporosis, talk with your health care provider. You can ask for a referral to a diet and nutrition specialist (dietitian) and a physical therapist. Where to find more information: Learn more about osteoporosis from:  NIH Osteoporosis and Related [place]: www.niams.GolfingGoddess.com.br  U.S. Office on Women's Health: SouvenirBaseball.es.html  National Osteoporosis Foundation: ProfilePeek.ch  Summary  Osteoporosis is a condition that causes weak bones that are more likely to break.  Eating a healthy diet and making sure you get enough calcium and vitamin D can help prevent osteoporosis.  Other ways to reduce your risk of osteoporosis include getting regular exercise and avoiding alcohol and products that contain nicotine or tobacco. This information is not intended to replace advice given to you by your health care provider. Make sure you discuss any questions you have with your health care provider. Document Released: 06/11/2015 Document Revised: 02/05/2016 Document Reviewed: 02/05/2016 Elsevier Interactive Patient Education  [employer].

## 2017-03-12 LAB — HEPATIC FUNCTION PANEL
AG RATIO: 1.6 (calc) (ref 1.0–2.5)
ALBUMIN MSPROF: 4.4 g/dL (ref 3.6–5.1)
ALT: 23 U/L (ref 6–29)
AST: 27 U/L (ref 10–35)
Alkaline phosphatase (APISO): 102 U/L (ref 33–130)
Bilirubin, Direct: 0.2 mg/dL (ref 0.0–0.2)
GLOBULIN: 2.7 g/dL (ref 1.9–3.7)
Indirect Bilirubin: 1.2 mg/dL (calc) (ref 0.2–1.2)
TOTAL PROTEIN: 7.1 g/dL (ref 6.1–8.1)
Total Bilirubin: 1.4 mg/dL — ABNORMAL HIGH (ref 0.2–1.2)

## 2017-03-12 LAB — CBC WITH DIFFERENTIAL/PLATELET
Basophils Absolute: 21 cells/uL (ref 0–200)
Basophils Relative: 0.5 %
EOS ABS: 8 {cells}/uL — AB (ref 15–500)
Eosinophils Relative: 0.2 %
HCT: 45 % (ref 35.0–45.0)
Hemoglobin: 15.3 g/dL (ref 11.7–15.5)
LYMPHS ABS: 1214 {cells}/uL (ref 850–3900)
MCH: 31 pg (ref 27.0–33.0)
MCHC: 34 g/dL (ref 32.0–36.0)
MCV: 91.3 fL (ref 80.0–100.0)
MPV: 9.4 fL (ref 7.5–12.5)
Monocytes Relative: 8.1 %
NEUTROS PCT: 61.6 %
Neutro Abs: 2526 cells/uL (ref 1500–7800)
Platelets: 189 10*3/uL (ref 140–400)
RBC: 4.93 10*6/uL (ref 3.80–5.10)
RDW: 11.6 % (ref 11.0–15.0)
TOTAL LYMPHOCYTE: 29.6 %
WBC: 4.1 10*3/uL (ref 3.8–10.8)
WBCMIX: 332 {cells}/uL (ref 200–950)

## 2017-03-12 LAB — BASIC METABOLIC PANEL WITH GFR
BUN: 15 mg/dL (ref 7–25)
CALCIUM: 10.4 mg/dL (ref 8.6–10.4)
CO2: 26 mmol/L (ref 20–32)
Chloride: 106 mmol/L (ref 98–110)
Creat: 0.95 mg/dL (ref 0.50–0.99)
GFR, EST AFRICAN AMERICAN: 71 mL/min/{1.73_m2} (ref >=60)
GFR, Est Non African American: 61 mL/min/{1.73_m2} (ref >=60)
Glucose, Bld: 94 mg/dL (ref 65–99)
Potassium: 4.4 mmol/L (ref 3.5–5.3)
Sodium: 140 mmol/L (ref 135–146)

## 2017-03-12 LAB — LIPID PANEL
CHOL/HDL RATIO: 2.1 (calc) (ref <5.0)
Cholesterol: 174 mg/dL (ref <200)
HDL: 84 mg/dL (ref >50)
LDL CHOLESTEROL (CALC): 75 mg/dL
Non-HDL Cholesterol (Calc): 90 mg/dL (calc) (ref <130)
TRIGLYCERIDES: 72 mg/dL (ref <150)

## 2017-03-12 LAB — TSH: TSH: 0.79 mIU/L (ref 0.40–4.50)

## 2017-03-12 LAB — HEMOGLOBIN A1C
EAG (MMOL/L): 6.2 (calc)
HEMOGLOBIN A1C: 5.5 %{Hb} (ref <5.7)
MEAN PLASMA GLUCOSE: 111 (calc)

## 2017-03-12 NOTE — Progress Notes (Signed)
Pt aware of lab results & voiced understanding of those results.

## 2017-03-31 ENCOUNTER — Other Ambulatory Visit: Payer: Self-pay | Admitting: Internal Medicine

## 2017-04-16 DIAGNOSIS — H401131 Primary open-angle glaucoma, bilateral, mild stage: Secondary | ICD-10-CM | POA: Diagnosis not present

## 2017-05-28 ENCOUNTER — Ambulatory Visit: Payer: Self-pay | Admitting: Internal Medicine

## 2017-06-29 ENCOUNTER — Other Ambulatory Visit: Payer: Self-pay | Admitting: Internal Medicine

## 2017-07-16 ENCOUNTER — Other Ambulatory Visit: Payer: Self-pay | Admitting: Internal Medicine

## 2017-07-22 ENCOUNTER — Other Ambulatory Visit: Payer: Self-pay | Admitting: Internal Medicine

## 2017-07-22 DIAGNOSIS — I10 Essential (primary) hypertension: Secondary | ICD-10-CM

## 2017-07-22 MED ORDER — ATENOLOL 100 MG PO TABS: ORAL_TABLET | ORAL | 3 refills | Status: DC

## 2017-07-23 ENCOUNTER — Other Ambulatory Visit: Payer: Self-pay | Admitting: *Deleted

## 2017-07-23 DIAGNOSIS — I10 Essential (primary) hypertension: Secondary | ICD-10-CM

## 2017-07-23 MED ORDER — ATENOLOL 100 MG PO TABS: ORAL_TABLET | ORAL | 3 refills | Status: DC

## 2017-07-25 DIAGNOSIS — H401131 Primary open-angle glaucoma, bilateral, mild stage: Secondary | ICD-10-CM | POA: Diagnosis not present

## 2017-07-25 NOTE — Progress Notes (Signed)
This encounter was created in error - please disregard.

## 2017-08-06 ENCOUNTER — Ambulatory Visit: Payer: Self-pay | Admitting: Internal Medicine

## 2017-08-11 ENCOUNTER — Ambulatory Visit: Payer: Self-pay | Admitting: Internal Medicine

## 2017-08-12 ENCOUNTER — Ambulatory Visit (INDEPENDENT_AMBULATORY_CARE_PROVIDER_SITE_OTHER): Payer: Medicare HMO | Admitting: Internal Medicine

## 2017-08-12 ENCOUNTER — Encounter: Payer: Self-pay | Admitting: Internal Medicine

## 2017-08-12 VITALS — BP 128/88 | HR 108 | Temp 97.7°F | Resp 16 | Ht 66.0 in | Wt 123.6 lb

## 2017-08-12 DIAGNOSIS — Z79899 Other long term (current) drug therapy: Secondary | ICD-10-CM | POA: Diagnosis not present

## 2017-08-12 DIAGNOSIS — D649 Anemia, unspecified: Secondary | ICD-10-CM

## 2017-08-12 DIAGNOSIS — R413 Other amnesia: Secondary | ICD-10-CM | POA: Diagnosis not present

## 2017-08-12 DIAGNOSIS — R7309 Other abnormal glucose: Secondary | ICD-10-CM

## 2017-08-12 DIAGNOSIS — I10 Essential (primary) hypertension: Secondary | ICD-10-CM

## 2017-08-12 NOTE — Patient Instructions (Signed)

## 2017-08-12 NOTE — Progress Notes (Signed)
[facility]   Unk Pinto, M.D.    Uvaldo Bristle. Silverio Lay, P.A.-C      Liane Comber, [place]                [address] [place] [address] Telephone [phone] Telefax [phone]  Subjective:    Patient ID: Shannon Nielsen, female    DOB: October 03, 1945, 72 y.o.   MRN: [id number]     This very nice 72 yo MBF with HTN, HLD, PreDM, Vit D def, hx/o remote Breast Ca (2011) presents with her cousin and collaborates  a hx/o "memory issues" described as forgetting simple statements repeated several times in the course of a short conversation. She denies getting lost in her car, does handle some check writing - paying some bills. Denies problems preparing meals,personal hygiene or with dressing/grooming.  Medication Sig  . aspirin 81 MG tablet Take 81 mg by mouth daily.   Marland Kitchen atenolol (TENORMIN) 100 MG tablet Take 1 tablet daily for BP  . Cholecalciferol (VITAMIN D PO) Take 2,000 Units by mouth daily.   . Dorzolamide HCl-Timolol Mal PF 22.3-6.8 MG/ML SOLN Apply 1 drop to eye 2 (two) times daily. Left eye  . fluticasone (FLONASE) 50 MCG/ACT nasal spray INSTILL 1 SPRAY INTO EACH NOSTRIL TWICE A DAY  . latanoprost (XALATAN) 0.005 % ophthalmic solution Place 1 drop into both eyes at bedtime.  Marland Kitchen letrozole (FEMARA) 2.5 MG tablet letrozole 2.5 mg tablet  . OVER THE COUNTER MEDICATION daily. B-Complex with 1000 mcg Biotin  . quinapril (ACCUPRIL) 20 MG tablet TAKE 1 TABLET BY MOUTH EVERY DAY  . rosuvastatin (CRESTOR) 20 MG tablet TAKE 1 TABLET BY MOUTH AT BEDTIME FOR CHOLESTEROL   Allergies  Allergen Reactions  . Minocycline   . Prednisone Swelling  . Sulfa Antibiotics     Patient does not remember the type of reaction, happened years ago.  . Zithromax [Azithromycin] Swelling  . Ciprofloxacin Hcl Rash  . Penicillins Rash   Review of Systems  10 point systems review negative except as above.     Objective:   Physical Exam  BP 128/88   Pulse (!) 108   Temp 97.7 F (36.5 C)   Resp 16   Ht 5\' 6"  (1.676 m)   Wt 123 lb 9.6 oz (56.1 kg)   BMI 19.95 kg/m   Well groomed.   HEENT - WNL. Neck - supple.  Chest - Clear equal BS. Cor - Nl HS. RRR w/o sig MGR. PP 1(+). No edema. MS- FROM w/o deformities.  Gait Nl. Neuro -  Nl w/o focal abnormalities. Alert, but somewhat hesitant. Formal MMSE not done Couldn't recall 3 objects - "apple , tree, book" - failed drawing clock test - inserted #'s correctly, but couldn't fill correct time  (ie 2:30)  Knows the president, date, day, month & year. Calculations not tested.    Assessment & Plan:   1. Memory loss  - Vitamin B12 - TSH - Ambulatory referral to Neurology  2. Essential hypertension  - CBC with Differential/Platelet - BASIC METABOLIC PANEL WITH GFR  3. Abnormal glucose  - Hemoglobin A1c  4. Anemia  - Vitamin B12  5. Medication management  - CBC with Differential/Platelet - Vitamin I95 - BASIC METABOLIC PANEL WITH GFR - TSH - Hemoglobin A1c

## 2017-08-13 LAB — HEMOGLOBIN A1C
EAG (MMOL/L): 6.3 (calc)
HEMOGLOBIN A1C: 5.6 %{Hb} (ref <5.7)
MEAN PLASMA GLUCOSE: 114 (calc)

## 2017-08-13 LAB — CBC WITH DIFFERENTIAL/PLATELET
Basophils Absolute: 30 cells/uL (ref 0–200)
Basophils Relative: 0.8 %
Eosinophils Absolute: 11 cells/uL — ABNORMAL LOW (ref 15–500)
Eosinophils Relative: 0.3 %
HCT: 46.4 % — ABNORMAL HIGH (ref 35.0–45.0)
Hemoglobin: 16 g/dL — ABNORMAL HIGH (ref 11.7–15.5)
Lymphs Abs: 1132 cells/uL (ref 850–3900)
MCH: 31.5 pg (ref 27.0–33.0)
MCHC: 34.5 g/dL (ref 32.0–36.0)
MCV: 91.3 fL (ref 80.0–100.0)
MPV: 9.7 fL (ref 7.5–12.5)
Monocytes Relative: 8.6 %
Neutro Abs: 2299 cells/uL (ref 1500–7800)
Neutrophils Relative %: 60.5 %
Platelets: 194 10*3/uL (ref 140–400)
RBC: 5.08 10*6/uL (ref 3.80–5.10)
RDW: 11.7 % (ref 11.0–15.0)
Total Lymphocyte: 29.8 %
WBC mixed population: 327 cells/uL (ref 200–950)
WBC: 3.8 10*3/uL (ref 3.8–10.8)

## 2017-08-13 LAB — BASIC METABOLIC PANEL WITH GFR
BUN/Creatinine Ratio: 11 (calc) (ref 6–22)
BUN: 12 mg/dL (ref 7–25)
CALCIUM: 11.1 mg/dL — AB (ref 8.6–10.4)
CHLORIDE: 106 mmol/L (ref 98–110)
CO2: 27 mmol/L (ref 20–32)
Creat: 1.14 mg/dL — ABNORMAL HIGH (ref 0.50–0.99)
GFR, EST NON AFRICAN AMERICAN: 49 mL/min/{1.73_m2} — AB (ref >=60)
GFR, Est African American: 57 mL/min/{1.73_m2} — ABNORMAL LOW (ref >=60)
GLUCOSE: 106 mg/dL — AB (ref 65–99)
POTASSIUM: 5 mmol/L (ref 3.5–5.3)
Sodium: 141 mmol/L (ref 135–146)

## 2017-08-13 LAB — TSH: TSH: 0.67 mIU/L (ref 0.40–4.50)

## 2017-08-13 LAB — VITAMIN B12: Vitamin B-12: 1116 pg/mL — ABNORMAL HIGH (ref 200–1100)

## 2017-08-28 ENCOUNTER — Ambulatory Visit: Payer: Self-pay | Admitting: Neurology

## 2017-09-05 NOTE — Progress Notes (Deleted)
Patient ID: Shannon Nielsen, female   DOB: 1945-11-28, 72 y.o.   MRN: [id number]  Assessment and Plan:  Hypertension:  -Continue medication,  -monitor blood pressure at home.  -Continue DASH diet.   -Reminder to go to the ER if any CP, SOB, nausea, dizziness, severe HA, changes vision/speech, left arm numbness and tingling, and jaw pain.  Cholesterol: -Continue diet and exercise.  -Check cholesterol.   Pre-diabetes: -Continue diet and exercise.  -Check A1C  Vitamin D Def: -check level -continue medications.     Continue diet and meds as discussed. Further disposition pending results of labs.  HPI 72 y.o. female  presents for 3 month follow up with hypertension, hyperlipidemia, prediabetes and vitamin D.  Recently referred to neurology for memory loss.    Her blood pressure has been controlled at home, today their BP is  .   She does not workout. She denies chest pain, shortness of breath, dizziness.     She is on cholesterol medication and denies myalgias. Her cholesterol is at goal. The cholesterol last visit was:   Lab Results  Component Value Date   CHOL 174 03/11/2017   HDL 84 03/11/2017   LDLCALC 75 03/11/2017   TRIG 72 03/11/2017   CHOLHDL 2.1 03/11/2017    She has been working on diet and exercise for prediabetes, and denies foot ulcerations, hyperglycemia, hypoglycemia , increased appetite, nausea, paresthesia of the feet, polydipsia, polyuria, visual disturbances, vomiting and weight loss. Last A1C in the office was:  Lab Results  Component Value Date   HGBA1C 5.6 08/12/2017  She has been watching her sugar intake and has been working on drinking more water.    Patient is on Vitamin D supplement.  Lab Results  Component Value Date   VD25OH 38 11/18/2016     She reports that she has been having some sore throat.she has had normal thyroid levels, unchanged thyroid US, needs follow up 1 year.    Current Medications:  Current Outpatient Medications on File  Prior to Visit  Medication Sig Dispense Refill  . aspirin 81 MG tablet Take 81 mg by mouth daily.     Marland Kitchen atenolol (TENORMIN) 100 MG tablet Take 1 tablet daily for BP 90 tablet 3  . Cholecalciferol (VITAMIN D PO) Take 2,000 Units by mouth daily.     . Dorzolamide HCl-Timolol Mal PF 22.3-6.8 MG/ML SOLN Apply 1 drop to eye 2 (two) times daily. Left eye    . fluticasone (FLONASE) 50 MCG/ACT nasal spray INSTILL 1 SPRAY INTO EACH NOSTRIL TWICE A DAY 16 g 3  . latanoprost (XALATAN) 0.005 % ophthalmic solution Place 1 drop into both eyes at bedtime.    Marland Kitchen letrozole (FEMARA) 2.5 MG tablet letrozole 2.5 mg tablet    . OVER THE COUNTER MEDICATION daily. B-Complex with 1000 mcg Biotin    . quinapril (ACCUPRIL) 20 MG tablet TAKE 1 TABLET BY MOUTH EVERY DAY 90 tablet 1  . rosuvastatin (CRESTOR) 20 MG tablet TAKE 1 TABLET BY MOUTH AT BEDTIME FOR CHOLESTEROL 90 tablet 1   No current facility-administered medications on file prior to visit.     Medical History:  Past Medical History:  Diagnosis Date  . Breast cancer ([place])   . Chest tightness   . GERD (gastroesophageal reflux disease)   . Hyperlipidemia   . Hypertension   . Palpitations   . Prediabetes   . Vitamin D deficiency     Allergies:  Allergies  Allergen Reactions  . Minocycline   .  Prednisone Swelling  . Sulfa Antibiotics     Patient does not remember the type of reaction, happened years ago.  . Zithromax [Azithromycin] Swelling  . Ciprofloxacin Hcl Rash  . Penicillins Rash     Review of Systems:  Review of Systems  Constitutional: Negative for chills, fever and malaise/fatigue.  HENT: Positive for sore throat. Negative for congestion and ear pain.   Eyes: Negative.   Respiratory: Negative for cough, shortness of breath and wheezing.   Cardiovascular: Negative for chest pain, palpitations and leg swelling.  Gastrointestinal: Negative for blood in stool, constipation, diarrhea, heartburn and melena.  Genitourinary: Negative.    Skin: Negative.   Neurological: Negative for dizziness, sensory change and headaches.  Psychiatric/Behavioral: Negative for depression. The patient is not nervous/anxious and does not have insomnia.     Family history- Review and unchanged  Social history- Review and unchanged  Physical Exam: There were no vitals taken for this visit. Wt Readings from Last 3 Encounters:  08/12/17 123 lb 9.6 oz (56.1 kg)  03/11/17 124 lb 9.6 oz (56.5 kg)  11/18/16 125 lb (56.7 kg)    General Appearance: Well nourished well developed, in no apparent distress. Eyes: PERRLA, EOMs, conjunctiva no swelling or erythema ENT/Mouth: Ear canals normal without obstruction, swelling, erythma, discharge.  TMs normal bilaterally.  Oropharynx moist, clear, without exudate, or postoropharyngeal swelling. Neck: Supple, thyroid normal,no cervical adenopathy  Respiratory: Respiratory effort normal, Breath sounds clear A&P without rhonchi, wheeze, or rale.  No retractions, no accessory usage. Cardio: RRR with no MRGs. Brisk peripheral pulses without edema.  Abdomen: Soft, + BS,  Non tender, no guarding, rebound, hernias, masses. Musculoskeletal: Full ROM, 5/5 strength, Normal gait Skin: Warm, dry without rashes, lesions, ecchymosis.  Neuro: Awake and oriented X 3, Cranial nerves intact. Normal muscle tone, no cerebellar symptoms. Psych: Normal affect, Insight and Judgment appropriate.    Vicie Mutters, PA-C 7:47 AM [facility]

## 2017-09-09 ENCOUNTER — Ambulatory Visit: Payer: Self-pay | Admitting: Physician Assistant

## 2017-09-24 ENCOUNTER — Telehealth: Payer: Self-pay | Admitting: Neurology

## 2017-09-24 ENCOUNTER — Ambulatory Visit: Payer: Medicare HMO | Admitting: Neurology

## 2017-09-24 ENCOUNTER — Encounter: Payer: Self-pay | Admitting: Neurology

## 2017-09-24 VITALS — BP 137/86 | HR 78 | Ht 66.5 in | Wt 125.0 lb

## 2017-09-24 DIAGNOSIS — G4719 Other hypersomnia: Secondary | ICD-10-CM | POA: Diagnosis not present

## 2017-09-24 DIAGNOSIS — R0683 Snoring: Secondary | ICD-10-CM

## 2017-09-24 DIAGNOSIS — R413 Other amnesia: Secondary | ICD-10-CM | POA: Diagnosis not present

## 2017-09-24 NOTE — Patient Instructions (Addendum)
-   MRI brain - labs: - Formal neurocognitive testing with Kandis Nab - Sleep evaluation for sleep disorder - Discussed exercise and the MIND diet developed at [facility].  - Consider FDG PET Scan for Dementia in the future
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
## 2017-09-24 NOTE — Telephone Encounter (Signed)
[employer] medicare order sent to [facility]. They will obtain auth and will reach out to the pt to schedule.

## 2017-09-24 NOTE — Progress Notes (Signed)
Epworth Sleepiness Scale 0= would never doze 1= slight chance of dozing 2= moderate chance of dozing 3= high chance of dozing  Sitting and reading: 2 Watching TV: 3 Sitting inactive in a public place (ex. Theater or meeting): 2 As a passenger in a car for an hour without a break: 3 Lying down to rest in the afternoon: 3 Sitting and talking to someone: 1 Sitting quietly after lunch (no alcohol): 1 In a car, while stopped in traffic: 1 Total: 16  FSS was 16.

## 2017-09-25 ENCOUNTER — Encounter: Payer: Self-pay | Admitting: Internal Medicine

## 2017-09-25 LAB — HOMOCYSTEINE: HOMOCYSTEINE: 10.5 umol/L (ref 0.0–15.0)

## 2017-09-25 LAB — RPR: RPR: NONREACTIVE

## 2017-09-25 LAB — FOLATE: Folate: >20 ng/mL (ref >3.0)

## 2017-09-26 ENCOUNTER — Telehealth: Payer: Self-pay | Admitting: Neurology

## 2017-09-26 NOTE — Telephone Encounter (Signed)
Called the pt and informed her that lab work was normal and there were no concerns. Pt verbalized understanding.

## 2017-09-26 NOTE — Telephone Encounter (Signed)
-----   Message from Melvenia Beam, MD sent at 09/25/2017  5:12 PM EDT ----- Labs normal

## 2017-09-28 NOTE — Progress Notes (Deleted)
Patient ID: Shannon Nielsen, female   DOB: [birth year], 72 y.o.   MRN: [id number]  Assessment and Plan:  Hypertension:  -Continue medication,  -monitor blood pressure at home.  -Continue DASH diet.   -Reminder to go to the ER if any CP, SOB, nausea, dizziness, severe HA, changes vision/speech, left arm numbness and tingling, and jaw pain.  Cholesterol: -Continue diet and exercise.  -Check cholesterol.   Pre-diabetes: -Continue diet and exercise.  -Check A1C  Vitamin D Def: -check level -continue medications.     Continue diet and meds as discussed. Further disposition pending results of labs.  HPI 72 y.o. female  presents for 3 month follow up with hypertension, hyperlipidemia, prediabetes and vitamin D.  Recently referred to neurology for memory loss.    Her blood pressure has been controlled at home, today their BP is  .   She does not workout. She denies chest pain, shortness of breath, dizziness.     She is on cholesterol medication and denies myalgias. Her cholesterol is at goal. The cholesterol last visit was:   Lab Results  Component Value Date   CHOL 174 03/11/2017   HDL 84 03/11/2017   LDLCALC 75 03/11/2017   TRIG 72 03/11/2017   CHOLHDL 2.1 03/11/2017    She has been working on diet and exercise for prediabetes, and denies foot ulcerations, hyperglycemia, hypoglycemia , increased appetite, nausea, paresthesia of the feet, polydipsia, polyuria, visual disturbances, vomiting and weight loss. Last A1C in the office was:  Lab Results  Component Value Date   HGBA1C 5.6 08/12/2017  She has been watching her sugar intake and has been working on drinking more water.    Patient is on Vitamin D supplement.  Lab Results  Component Value Date   VD25OH 38 11/18/2016     She reports that she has been having some sore throat.she has had normal thyroid levels, unchanged thyroid US, needs follow up 1 year.    Current Medications:  Current Outpatient Medications on File  Prior to Visit  Medication Sig Dispense Refill  . aspirin 81 MG tablet Take 81 mg by mouth daily.     Marland Kitchen atenolol (TENORMIN) 100 MG tablet Take 1 tablet daily for BP 90 tablet 3  . Cholecalciferol (VITAMIN D PO) Take 2,000 Units by mouth daily.     . Dorzolamide HCl-Timolol Mal PF 22.3-6.8 MG/ML SOLN Apply 1 drop to eye 2 (two) times daily. Left eye    . fluticasone (FLONASE) 50 MCG/ACT nasal spray INSTILL 1 SPRAY INTO EACH NOSTRIL TWICE A DAY 16 g 3  . latanoprost (XALATAN) 0.005 % ophthalmic solution Place 1 drop into both eyes at bedtime.    Marland Kitchen letrozole (FEMARA) 2.5 MG tablet letrozole 2.5 mg tablet    . OVER THE COUNTER MEDICATION daily. B-Complex with 1000 mcg Biotin    . quinapril (ACCUPRIL) 20 MG tablet TAKE 1 TABLET BY MOUTH EVERY DAY 90 tablet 1  . rosuvastatin (CRESTOR) 20 MG tablet TAKE 1 TABLET BY MOUTH AT BEDTIME FOR CHOLESTEROL 90 tablet 1   No current facility-administered medications on file prior to visit.     Medical History:  Past Medical History:  Diagnosis Date  . Breast cancer ([place])   . Chest tightness   . GERD (gastroesophageal reflux disease)   . Hyperlipidemia   . Hypertension   . Palpitations   . Prediabetes   . Vitamin D deficiency     Allergies:  Allergies  Allergen Reactions  . Minocycline   .  Prednisone Swelling  . Sulfa Antibiotics     Patient does not remember the type of reaction, happened years ago.  . Zithromax [Azithromycin] Swelling  . Ciprofloxacin Hcl Rash  . Penicillins Rash     Review of Systems:  Review of Systems  Constitutional: Negative for chills, fever and malaise/fatigue.  HENT: Positive for sore throat. Negative for congestion and ear pain.   Eyes: Negative.   Respiratory: Negative for cough, shortness of breath and wheezing.   Cardiovascular: Negative for chest pain, palpitations and leg swelling.  Gastrointestinal: Negative for blood in stool, constipation, diarrhea, heartburn and melena.  Genitourinary: Negative.    Skin: Negative.   Neurological: Negative for dizziness, sensory change and headaches.  Psychiatric/Behavioral: Negative for depression. The patient is not nervous/anxious and does not have insomnia.     Family history- Review and unchanged  Social history- Review and unchanged  Physical Exam: There were no vitals taken for this visit. Wt Readings from Last 3 Encounters:  09/24/17 125 lb (56.7 kg)  08/12/17 123 lb 9.6 oz (56.1 kg)  03/11/17 124 lb 9.6 oz (56.5 kg)    General Appearance: Well nourished well developed, in no apparent distress. Eyes: PERRLA, EOMs, conjunctiva no swelling or erythema ENT/Mouth: Ear canals normal without obstruction, swelling, erythma, discharge.  TMs normal bilaterally.  Oropharynx moist, clear, without exudate, or postoropharyngeal swelling. Neck: Supple, thyroid normal,no cervical adenopathy  Respiratory: Respiratory effort normal, Breath sounds clear A&P without rhonchi, wheeze, or rale.  No retractions, no accessory usage. Cardio: RRR with no MRGs. Brisk peripheral pulses without edema.  Abdomen: Soft, + BS,  Non tender, no guarding, rebound, hernias, masses. Musculoskeletal: Full ROM, 5/5 strength, Normal gait Skin: Warm, dry without rashes, lesions, ecchymosis.  Neuro: Awake and oriented X 3, Cranial nerves intact. Normal muscle tone, no cerebellar symptoms. Psych: Normal affect, Insight and Judgment appropriate.    Vicie Mutters, PA-C 8:59 AM [facility]

## 2017-09-30 ENCOUNTER — Encounter: Payer: Self-pay | Admitting: Psychology

## 2017-09-30 ENCOUNTER — Ambulatory Visit: Payer: Self-pay | Admitting: Physician Assistant

## 2017-09-30 ENCOUNTER — Other Ambulatory Visit: Payer: Medicare HMO

## 2017-10-06 ENCOUNTER — Ambulatory Visit
Admission: RE | Admit: 2017-10-06 | Discharge: 2017-10-06 | Disposition: A | Discharge status: Home / Self Care | Payer: Medicare HMO | Source: Ambulatory Visit | Attending: Neurology | Admitting: Neurology

## 2017-10-06 DIAGNOSIS — R413 Other amnesia: Secondary | ICD-10-CM

## 2017-10-06 NOTE — Progress Notes (Deleted)
Patient ID: DRU LAUREL, female   DOB: 01-Nov-1945, 72 y.o.   MRN: [id number]  Assessment and Plan:  Hypertension:  -Continue medication,  -monitor blood pressure at home.  -Continue DASH diet.   -Reminder to go to the ER if any CP, SOB, nausea, dizziness, severe HA, changes vision/speech, left arm numbness and tingling, and jaw pain.  Cholesterol: -Continue diet and exercise.  -Check cholesterol.   Pre-diabetes: -Continue diet and exercise.  -Check A1C  Vitamin D Def: -check level -continue medications.     Continue diet and meds as discussed. Further disposition pending results of labs.  HPI 72 y.o. female  presents for 3 month follow up with hypertension, hyperlipidemia, prediabetes and vitamin D.  Recently referred to neurology for memory loss.    Her blood pressure has been controlled at home, today their BP is  .   She does not workout. She denies chest pain, shortness of breath, dizziness.     She is on cholesterol medication and denies myalgias. Her cholesterol is at goal. The cholesterol last visit was:   Lab Results  Component Value Date   CHOL 174 03/11/2017   HDL 84 03/11/2017   LDLCALC 75 03/11/2017   TRIG 72 03/11/2017   CHOLHDL 2.1 03/11/2017    She has been working on diet and exercise for prediabetes, and denies foot ulcerations, hyperglycemia, hypoglycemia , increased appetite, nausea, paresthesia of the feet, polydipsia, polyuria, visual disturbances, vomiting and weight loss. Last A1C in the office was:  Lab Results  Component Value Date   HGBA1C 5.6 08/12/2017  She has been watching her sugar intake and has been working on drinking more water.    Patient is on Vitamin D supplement.  Lab Results  Component Value Date   VD25OH 38 11/18/2016     She reports that she has been having some sore throat.she has had normal thyroid levels, unchanged thyroid US, needs follow up 1 year.    Current Medications:  Current Outpatient Medications on File  Prior to Visit  Medication Sig Dispense Refill  . aspirin 81 MG tablet Take 81 mg by mouth daily.     Marland Kitchen atenolol (TENORMIN) 100 MG tablet Take 1 tablet daily for BP 90 tablet 3  . Cholecalciferol (VITAMIN D PO) Take 2,000 Units by mouth daily.     . Dorzolamide HCl-Timolol Mal PF 22.3-6.8 MG/ML SOLN Apply 1 drop to eye 2 (two) times daily. Left eye    . fluticasone (FLONASE) 50 MCG/ACT nasal spray INSTILL 1 SPRAY INTO EACH NOSTRIL TWICE A DAY 16 g 3  . latanoprost (XALATAN) 0.005 % ophthalmic solution Place 1 drop into both eyes at bedtime.    Marland Kitchen letrozole (FEMARA) 2.5 MG tablet letrozole 2.5 mg tablet    . OVER THE COUNTER MEDICATION daily. B-Complex with 1000 mcg Biotin    . quinapril (ACCUPRIL) 20 MG tablet TAKE 1 TABLET BY MOUTH EVERY DAY 90 tablet 1  . rosuvastatin (CRESTOR) 20 MG tablet TAKE 1 TABLET BY MOUTH AT BEDTIME FOR CHOLESTEROL 90 tablet 1   No current facility-administered medications on file prior to visit.     Medical History:  Past Medical History:  Diagnosis Date  . Breast cancer ([place])   . Chest tightness   . GERD (gastroesophageal reflux disease)   . Hyperlipidemia   . Hypertension   . Palpitations   . Prediabetes   . Vitamin D deficiency     Allergies:  Allergies  Allergen Reactions  . Minocycline   .  Prednisone Swelling  . Sulfa Antibiotics     Patient does not remember the type of reaction, happened years ago.  . Zithromax [Azithromycin] Swelling  . Ciprofloxacin Hcl Rash  . Penicillins Rash     Review of Systems:  Review of Systems  Constitutional: Negative for chills, fever and malaise/fatigue.  HENT: Positive for sore throat. Negative for congestion and ear pain.   Eyes: Negative.   Respiratory: Negative for cough, shortness of breath and wheezing.   Cardiovascular: Negative for chest pain, palpitations and leg swelling.  Gastrointestinal: Negative for blood in stool, constipation, diarrhea, heartburn and melena.  Genitourinary: Negative.    Skin: Negative.   Neurological: Negative for dizziness, sensory change and headaches.  Psychiatric/Behavioral: Negative for depression. The patient is not nervous/anxious and does not have insomnia.     Family history- Review and unchanged  Social history- Review and unchanged  Physical Exam: There were no vitals taken for this visit. Wt Readings from Last 3 Encounters:  09/24/17 125 lb (56.7 kg)  08/12/17 123 lb 9.6 oz (56.1 kg)  03/11/17 124 lb 9.6 oz (56.5 kg)    General Appearance: Well nourished well developed, in no apparent distress. Eyes: PERRLA, EOMs, conjunctiva no swelling or erythema ENT/Mouth: Ear canals normal without obstruction, swelling, erythma, discharge.  TMs normal bilaterally.  Oropharynx moist, clear, without exudate, or postoropharyngeal swelling. Neck: Supple, thyroid normal,no cervical adenopathy  Respiratory: Respiratory effort normal, Breath sounds clear A&P without rhonchi, wheeze, or rale.  No retractions, no accessory usage. Cardio: RRR with no MRGs. Brisk peripheral pulses without edema.  Abdomen: Soft, + BS,  Non tender, no guarding, rebound, hernias, masses. Musculoskeletal: Full ROM, 5/5 strength, Normal gait Skin: Warm, dry without rashes, lesions, ecchymosis.  Neuro: Awake and oriented X 3, Cranial nerves intact. Normal muscle tone, no cerebellar symptoms. Psych: Normal affect, Insight and Judgment appropriate.    Vicie Mutters, PA-C 7:35 AM [facility]

## 2017-10-08 ENCOUNTER — Ambulatory Visit: Payer: Self-pay | Admitting: Physician Assistant

## 2017-10-08 ENCOUNTER — Ambulatory Visit: Payer: Self-pay | Admitting: Neurology

## 2017-10-13 NOTE — Progress Notes (Deleted)
Patient ID: Shannon Nielsen, female   DOB: May 08, 1946, 72 y.o.   MRN: [id number]  Assessment and Plan:  Hypertension:  -Continue medication,  -monitor blood pressure at home.  -Continue DASH diet.   -Reminder to go to the ER if any CP, SOB, nausea, dizziness, severe HA, changes vision/speech, left arm numbness and tingling, and jaw pain.  Cholesterol: -Continue diet and exercise.  -Check cholesterol.   Pre-diabetes: -Continue diet and exercise.  -Check A1C  Vitamin D Def: -check level -continue medications.     Continue diet and meds as discussed. Further disposition pending results of labs.  HPI 72 y.o. female  presents for 3 month follow up with hypertension, hyperlipidemia, prediabetes and vitamin D.  Recently referred to neurology for memory loss.    Her blood pressure has been controlled at home, today their BP is  .   She does not workout. She denies chest pain, shortness of breath, dizziness.     She is on cholesterol medication and denies myalgias. Her cholesterol is at goal. The cholesterol last visit was:   Lab Results  Component Value Date   CHOL 174 03/11/2017   HDL 84 03/11/2017   LDLCALC 75 03/11/2017   TRIG 72 03/11/2017   CHOLHDL 2.1 03/11/2017    She has been working on diet and exercise for prediabetes, and denies foot ulcerations, hyperglycemia, hypoglycemia , increased appetite, nausea, paresthesia of the feet, polydipsia, polyuria, visual disturbances, vomiting and weight loss. Last A1C in the office was:  Lab Results  Component Value Date   HGBA1C 5.6 08/12/2017  She has been watching her sugar intake and has been working on drinking more water.    Patient is on Vitamin D supplement.  Lab Results  Component Value Date   VD25OH 38 11/18/2016     She reports that she has been having some sore throat.she has had normal thyroid levels, unchanged thyroid US, needs follow up 1 year.    Current Medications:  Current Outpatient Medications on File  Prior to Visit  Medication Sig Dispense Refill  . aspirin 81 MG tablet Take 81 mg by mouth daily.     Marland Kitchen atenolol (TENORMIN) 100 MG tablet Take 1 tablet daily for BP 90 tablet 3  . Cholecalciferol (VITAMIN D PO) Take 2,000 Units by mouth daily.     . Dorzolamide HCl-Timolol Mal PF 22.3-6.8 MG/ML SOLN Apply 1 drop to eye 2 (two) times daily. Left eye    . fluticasone (FLONASE) 50 MCG/ACT nasal spray INSTILL 1 SPRAY INTO EACH NOSTRIL TWICE A DAY 16 g 3  . latanoprost (XALATAN) 0.005 % ophthalmic solution Place 1 drop into both eyes at bedtime.    Marland Kitchen letrozole (FEMARA) 2.5 MG tablet letrozole 2.5 mg tablet    . OVER THE COUNTER MEDICATION daily. B-Complex with 1000 mcg Biotin    . quinapril (ACCUPRIL) 20 MG tablet TAKE 1 TABLET BY MOUTH EVERY DAY 90 tablet 1  . rosuvastatin (CRESTOR) 20 MG tablet TAKE 1 TABLET BY MOUTH AT BEDTIME FOR CHOLESTEROL 90 tablet 1   No current facility-administered medications on file prior to visit.     Medical History:  Past Medical History:  Diagnosis Date  . Breast cancer ([place])   . Chest tightness   . GERD (gastroesophageal reflux disease)   . Hyperlipidemia   . Hypertension   . Palpitations   . Prediabetes   . Vitamin D deficiency     Allergies:  Allergies  Allergen Reactions  . Minocycline   .  Prednisone Swelling  . Sulfa Antibiotics     Patient does not remember the type of reaction, happened years ago.  . Zithromax [Azithromycin] Swelling  . Ciprofloxacin Hcl Rash  . Penicillins Rash     Review of Systems:  Review of Systems  Constitutional: Negative for chills, fever and malaise/fatigue.  HENT: Positive for sore throat. Negative for congestion and ear pain.   Eyes: Negative.   Respiratory: Negative for cough, shortness of breath and wheezing.   Cardiovascular: Negative for chest pain, palpitations and leg swelling.  Gastrointestinal: Negative for blood in stool, constipation, diarrhea, heartburn and melena.  Genitourinary: Negative.    Skin: Negative.   Neurological: Negative for dizziness, sensory change and headaches.  Psychiatric/Behavioral: Negative for depression. The patient is not nervous/anxious and does not have insomnia.     Family history- Review and unchanged  Social history- Review and unchanged  Physical Exam: There were no vitals taken for this visit. Wt Readings from Last 3 Encounters:  09/24/17 125 lb (56.7 kg)  08/12/17 123 lb 9.6 oz (56.1 kg)  03/11/17 124 lb 9.6 oz (56.5 kg)    General Appearance: Well nourished well developed, in no apparent distress. Eyes: PERRLA, EOMs, conjunctiva no swelling or erythema ENT/Mouth: Ear canals normal without obstruction, swelling, erythma, discharge.  TMs normal bilaterally.  Oropharynx moist, clear, without exudate, or postoropharyngeal swelling. Neck: Supple, thyroid normal,no cervical adenopathy  Respiratory: Respiratory effort normal, Breath sounds clear A&P without rhonchi, wheeze, or rale.  No retractions, no accessory usage. Cardio: RRR with no MRGs. Brisk peripheral pulses without edema.  Abdomen: Soft, + BS,  Non tender, no guarding, rebound, hernias, masses. Musculoskeletal: Full ROM, 5/5 strength, Normal gait Skin: Warm, dry without rashes, lesions, ecchymosis.  Neuro: Awake and oriented X 3, Cranial nerves intact. Normal muscle tone, no cerebellar symptoms. Psych: Normal affect, Insight and Judgment appropriate.    Vicie Mutters, PA-C 2:27 PM [facility]

## 2017-10-15 ENCOUNTER — Ambulatory Visit: Payer: Self-pay | Admitting: Physician Assistant

## 2017-10-16 ENCOUNTER — Telehealth: Payer: Self-pay | Admitting: Neurology

## 2017-10-16 NOTE — Telephone Encounter (Signed)
-----   Message from Melvenia Beam, MD sent at 10/12/2017  3:53 PM EDT ----- No strokes, no masses, no cancer or anything like this. But Patient has vascular changes in the brain, atherosclerosis and small vessel changes. This is usually from aging, diabetes, smoking or being around smokers, high blood pressure, high cholesterol. But these changes can cause memory loss of a vascular etiology. Also the covering of the brain have some calcium on them, this can be seen in hyperparathyroidism, too much vitamin D or chronic kidney failure. If they like please set up an appointment in the office in the next few weeks so I can explain to them and show them the MRI images and test for the above. thanks

## 2017-10-16 NOTE — Progress Notes (Deleted)
Patient ID: VERNICE MANNINA, female   DOB: February 29, 1946, 72 y.o.   MRN: [id number]  Assessment and Plan:  Hypertension:  -Continue medication,  -monitor blood pressure at home.  -Continue DASH diet.   -Reminder to go to the ER if any CP, SOB, nausea, dizziness, severe HA, changes vision/speech, left arm numbness and tingling, and jaw pain.  Cholesterol: -Continue diet and exercise.  -Check cholesterol.   Pre-diabetes: -Continue diet and exercise.  -Check A1C  Vitamin D Def: -check level -continue medications.     Continue diet and meds as discussed. Further disposition pending results of labs.  HPI 72 y.o. female  presents for 3 month follow up with hypertension, hyperlipidemia, prediabetes and vitamin D.  Recently referred to neurology for memory loss.    Her blood pressure has been controlled at home, today their BP is  .   She does not workout. She denies chest pain, shortness of breath, dizziness.     She is on cholesterol medication and denies myalgias. Her cholesterol is at goal. The cholesterol last visit was:   Lab Results  Component Value Date   CHOL 174 03/11/2017   HDL 84 03/11/2017   LDLCALC 75 03/11/2017   TRIG 72 03/11/2017   CHOLHDL 2.1 03/11/2017    She has been working on diet and exercise for prediabetes, and denies foot ulcerations, hyperglycemia, hypoglycemia , increased appetite, nausea, paresthesia of the feet, polydipsia, polyuria, visual disturbances, vomiting and weight loss. Last A1C in the office was:  Lab Results  Component Value Date   HGBA1C 5.6 08/12/2017  She has been watching her sugar intake and has been working on drinking more water.    Patient is on Vitamin D supplement.  Lab Results  Component Value Date   VD25OH 38 11/18/2016     She reports that she has been having some sore throat.she has had normal thyroid levels, unchanged thyroid US, needs follow up 1 year.    Current Medications:  Current Outpatient Medications on File  Prior to Visit  Medication Sig Dispense Refill  . aspirin 81 MG tablet Take 81 mg by mouth daily.     Marland Kitchen atenolol (TENORMIN) 100 MG tablet Take 1 tablet daily for BP 90 tablet 3  . Cholecalciferol (VITAMIN D PO) Take 2,000 Units by mouth daily.     . Dorzolamide HCl-Timolol Mal PF 22.3-6.8 MG/ML SOLN Apply 1 drop to eye 2 (two) times daily. Left eye    . fluticasone (FLONASE) 50 MCG/ACT nasal spray INSTILL 1 SPRAY INTO EACH NOSTRIL TWICE A DAY 16 g 3  . latanoprost (XALATAN) 0.005 % ophthalmic solution Place 1 drop into both eyes at bedtime.    Marland Kitchen letrozole (FEMARA) 2.5 MG tablet letrozole 2.5 mg tablet    . OVER THE COUNTER MEDICATION daily. B-Complex with 1000 mcg Biotin    . quinapril (ACCUPRIL) 20 MG tablet TAKE 1 TABLET BY MOUTH EVERY DAY 90 tablet 1  . rosuvastatin (CRESTOR) 20 MG tablet TAKE 1 TABLET BY MOUTH AT BEDTIME FOR CHOLESTEROL 90 tablet 1   No current facility-administered medications on file prior to visit.     Medical History:  Past Medical History:  Diagnosis Date  . Breast cancer ([place])   . Chest tightness   . GERD (gastroesophageal reflux disease)   . Hyperlipidemia   . Hypertension   . Palpitations   . Prediabetes   . Vitamin D deficiency     Allergies:  Allergies  Allergen Reactions  . Minocycline   .  Prednisone Swelling  . Sulfa Antibiotics     Patient does not remember the type of reaction, happened years ago.  . Zithromax [Azithromycin] Swelling  . Ciprofloxacin Hcl Rash  . Penicillins Rash     Review of Systems:  Review of Systems  Constitutional: Negative for chills, fever and malaise/fatigue.  HENT: Negative for congestion, ear pain and sore throat.   Eyes: Negative.   Respiratory: Negative for cough, shortness of breath and wheezing.   Cardiovascular: Negative for chest pain, palpitations and leg swelling.  Gastrointestinal: Negative for blood in stool, constipation, diarrhea, heartburn and melena.  Genitourinary: Negative.   Skin:  Negative.   Neurological: Negative for dizziness, sensory change and headaches.  Psychiatric/Behavioral: Negative for depression. The patient is not nervous/anxious and does not have insomnia.     Family history- Review and unchanged  Social history- Review and unchanged  Physical Exam: There were no vitals taken for this visit. Wt Readings from Last 3 Encounters:  09/24/17 125 lb (56.7 kg)  08/12/17 123 lb 9.6 oz (56.1 kg)  03/11/17 124 lb 9.6 oz (56.5 kg)    General Appearance: Well nourished well developed, in no apparent distress. Eyes: PERRLA, EOMs, conjunctiva no swelling or erythema ENT/Mouth: Ear canals normal without obstruction, swelling, erythma, discharge.  TMs normal bilaterally.  Oropharynx moist, clear, without exudate, or postoropharyngeal swelling. Neck: Supple, thyroid normal,no cervical adenopathy  Respiratory: Respiratory effort normal, Breath sounds clear A&P without rhonchi, wheeze, or rale.  No retractions, no accessory usage. Cardio: RRR with no MRGs. Brisk peripheral pulses without edema.  Abdomen: Soft, + BS,  Non tender, no guarding, rebound, hernias, masses. Musculoskeletal: Full ROM, 5/5 strength, Normal gait Skin: Warm, dry without rashes, lesions, ecchymosis.  Neuro: Awake and oriented X 3, Cranial nerves intact. Normal muscle tone, no cerebellar symptoms. Psych: Normal affect, Insight and Judgment appropriate.    Vicie Mutters, PA-C 7:40 AM [facility]

## 2017-10-16 NOTE — Telephone Encounter (Signed)
Called the pt to discuss MRI results. VM box is full, will attempt again.

## 2017-10-20 ENCOUNTER — Other Ambulatory Visit: Payer: Self-pay | Admitting: *Deleted

## 2017-10-20 ENCOUNTER — Ambulatory Visit: Payer: Self-pay | Admitting: Physician Assistant

## 2017-10-20 MED ORDER — QUINAPRIL HCL 20 MG PO TABS: 20.0000 mg | ORAL_TABLET | Freq: Every day | ORAL | 1 refills | Status: DC

## 2017-10-21 ENCOUNTER — Ambulatory Visit: Payer: Self-pay | Admitting: Neurology

## 2017-10-21 ENCOUNTER — Telehealth: Payer: Self-pay | Admitting: *Deleted

## 2017-10-21 NOTE — Telephone Encounter (Addendum)
Spoke with patient and discussed the following results of MRI brain: No strokes, no masses, no cancer or anything like this. She has vascular changes in the brain, atherosclerosis and small vessel changes. This is usually from aging, diabetes, smoking or being around smokers, high blood pressure, high cholesterol. But these changes can cause memory loss of a vascular etiology. Also the covering of the brain have some calcium on them, this can be seen in hyperparathyroidism, too much vitamin D or chronic kidney failure. If they like we can set up an appointment in the office in the next few weeks so Dr. [name] can explain to them and show them the MRI images. Patient stated she has hyperparathyroidism and hypertension but doesn't smoke. RN answered her questions. This was also discussed in detail with the patient's husband on the phone. They will discuss together and call back. Husband stated more than likely they will make an appt. He also asked for VM to be left on his cell phone so they can hear it again and discuss. This was done per his request. RN left the exact same information on the vm that was discussed on the phone along with office number and hours for return call.   ----- Message from Melvenia Beam, MD sent at 10/12/2017  3:53 PM EDT ----- No strokes, no masses, no cancer or anything like this. But Patient has vascular changes in the brain, atherosclerosis and small vessel changes. This is usually from aging, diabetes, smoking or being around smokers, high blood pressure, high cholesterol. But these changes can cause memory loss of a vascular etiology. Also the covering of the brain have some calcium on them, this can be seen in hyperparathyroidism, too much vitamin D or chronic kidney failure. If they like please set up an appointment in the office in the next few weeks so I can explain to them and show them the MRI images and test for the above. thanks

## 2017-10-21 NOTE — Telephone Encounter (Signed)
Opened new phone note in error. This MRI result was discussed in other phone note from 10/21/2017.

## 2017-10-24 NOTE — Progress Notes (Signed)
FOLLOW UP  Assessment and Plan:   Hypertension Well controlled with current medications  Monitor blood pressure at home; patient to call if consistently greater than 130/80 Continue DASH diet.   Reminder to go to the ER if any CP, SOB, nausea, dizziness, severe HA, changes vision/speech, left arm numbness and tingling and jaw pain.  Cholesterol Currently at goal; continue with statin Continue low cholesterol diet and exercise.  Check lipid panel.   Other abnormal glucose Borderline A1Cs with hx of prediabetes Continue diet and exercise.  Perform daily foot/skin check, notify office of any concerning changes.  Check A1C  BMI 20 Continue to recommend diet heavy in fruits and veggies and low in animal meats, cheeses, and dairy products, appropriate calorie intake Discuss exercise recommendations routinely Continue to monitor weight at each visit  Vitamin D Def At goal at last check; continue supplementation  Defer Vit D level to CPE  Memory changes MME 24/30, discussed MRI results with patient and family member - has family hx of Alzheimer's, MRS suggested vascular component Emphasized good lifestyle, start exercising, continue with ASA Reminded to schedule follow up with Dr. [name] per her recent note  Continue diet and meds as discussed. Further disposition pending results of labs. Discussed med's effects and SE's.   Over 30 minutes of exam, counseling, chart review, and critical decision making was performed.   Future Appointments  Date Time Provider [place]  11/17/2017  4:00 PM Star Age, MD [facility] None  12/04/2017  9:00 AM Unk Pinto, MD [facility] None  03/02/2018 11:00 AM Kandis Nab, PsyD [facility] None  03/02/2018 12:00 PM [facility] NEUROPSYCH TECH [facility] None  03/26/2018  2:00 PM Kandis Nab, PsyD [facility] None  03/26/2018  3:00 PM Melvenia Beam, MD [facility] None     ----------------------------------------------------------------------------------------------------------------------  HPI 72 y.o. female  presents accompanied by her cousin for 3 month follow up on hypertension, cholesterol, glucose management, weight and vitamin D deficiency. Patient is s/p Bilat Mastectomies for Lt Breast Ca in 2011. Patient had excision of a Rt Parathyroid Adenoma for Hyperparathyroidism in 2001 has had fluctuating PTH & calcium levels concern for recurrent hyperparathyroidism being followed by Dr. Harland German at [facility]. She has recently noted memory changes, forgetting why she left a room, etc with strong family hx of dementia and was referred to neurology Dr. [name]; MRI showed chronic microvascular changes and some calcium deposits. She is scheduling a follow up appointment and has further testing scheduled. MME today is 24/30.   BMI is Body mass index is 20.19 kg/m., she has been working on diet and exercise. Wt Readings from Last 3 Encounters:  10/27/17 127 lb (57.6 kg)  09/24/17 125 lb (56.7 kg)  08/12/17 123 lb 9.6 oz (56.1 kg)   Her blood pressure has been controlled at home, today their BP is BP: 134/82  She does not workout. She denies chest pain, shortness of breath, dizziness.   She is on cholesterol medication (rosuvastatin 20 mg daily) and denies myalgias. Her cholesterol is at goal. The cholesterol last visit was:   Lab Results  Component Value Date   CHOL 174 03/11/2017   HDL 84 03/11/2017   LDLCALC 75 03/11/2017   TRIG 72 03/11/2017   CHOLHDL 2.1 03/11/2017    She has not been working on diet and exercise for glucose management (hx of prediabetes), and denies foot ulcerations, increased appetite, nausea, paresthesia of the feet, polydipsia, polyuria, visual disturbances, vomiting and weight loss. Last A1C in the office  was:  Lab Results  Component Value Date   HGBA1C 5.6 08/12/2017   Patient is on Vitamin D supplement:    Lab  Results  Component Value Date   VD25OH 38 11/18/2016       Current Medications:  Current Outpatient Medications on File Prior to Visit  Medication Sig  . aspirin 81 MG tablet Take 81 mg by mouth daily.   Marland Kitchen atenolol (TENORMIN) 100 MG tablet Take 1 tablet daily for BP  . Cholecalciferol (VITAMIN D PO) Take 2,000 Units by mouth daily.   . Dorzolamide HCl-Timolol Mal PF 22.3-6.8 MG/ML SOLN Apply 1 drop to eye 2 (two) times daily. Left eye  . fluticasone (FLONASE) 50 MCG/ACT nasal spray INSTILL 1 SPRAY INTO EACH NOSTRIL TWICE A DAY  . latanoprost (XALATAN) 0.005 % ophthalmic solution Place 1 drop into both eyes at bedtime.  Marland Kitchen letrozole (FEMARA) 2.5 MG tablet letrozole 2.5 mg tablet  . OVER THE COUNTER MEDICATION daily. B-Complex with 1000 mcg Biotin  . quinapril (ACCUPRIL) 20 MG tablet Take 1 tablet (20 mg total) by mouth daily. (Patient taking differently: Take 20 mg by mouth daily. Take 1/2 tablet daily)  . rosuvastatin (CRESTOR) 20 MG tablet TAKE 1 TABLET BY MOUTH AT BEDTIME FOR CHOLESTEROL   No current facility-administered medications on file prior to visit.      Allergies:  Allergies  Allergen Reactions  . Minocycline   . Prednisone Swelling  . Sulfa Antibiotics     Patient does not remember the type of reaction, happened years ago.  . Zithromax [Azithromycin] Swelling  . Ciprofloxacin Hcl Rash  . Penicillins Rash     Medical History:  Past Medical History:  Diagnosis Date  . Breast cancer ([place])   . Chest tightness   . GERD (gastroesophageal reflux disease)   . Hyperlipidemia   . Hypertension   . Palpitations   . Prediabetes   . Vitamin D deficiency    Family history- Reviewed and unchanged Social history- Reviewed and unchanged   Review of Systems:  Review of Systems  Constitutional: Negative for malaise/fatigue and weight loss.  HENT: Negative for hearing loss and tinnitus.   Eyes: Negative for blurred vision and double vision.  Respiratory: Negative for  cough, shortness of breath and wheezing.   Cardiovascular: Negative for chest pain, palpitations, orthopnea, claudication and leg swelling.  Gastrointestinal: Negative for abdominal pain, blood in stool, constipation, diarrhea, heartburn, melena, nausea and vomiting.  Genitourinary: Negative.   Musculoskeletal: Negative for joint pain and myalgias.  Skin: Negative for rash.  Neurological: Negative for dizziness, tingling, sensory change, weakness and headaches.  Endo/Heme/Allergies: Negative for polydipsia.  Psychiatric/Behavioral: Positive for memory loss (Mild). Negative for depression, hallucinations, substance abuse and suicidal ideas. The patient is not nervous/anxious and does not have insomnia.   All other systems reviewed and are negative.   Physical Exam: BP 134/82   Pulse 100   Temp 97.9 F (36.6 C)   Ht 5' 6.5" (1.689 m)   Wt 127 lb (57.6 kg)   SpO2 99%   BMI 20.19 kg/m  Wt Readings from Last 3 Encounters:  10/27/17 127 lb (57.6 kg)  09/24/17 125 lb (56.7 kg)  08/12/17 123 lb 9.6 oz (56.1 kg)   General Appearance: Well nourished, in no apparent distress. Eyes: PERRLA, EOMs, conjunctiva no swelling or erythema Sinuses: No Frontal/maxillary tenderness ENT/Mouth: Ext aud canals clear, TMs without erythema, bulging. No erythema, swelling, or exudate on post pharynx.  Tonsils not swollen or erythematous. Hearing  normal.  Neck: Supple, thyroid normal.  Respiratory: Respiratory effort normal, BS equal bilaterally without rales, rhonchi, wheezing or stridor.  Cardio: RRR with no MRGs. Brisk peripheral pulses without edema.  Abdomen: Soft, + BS.  Non tender, no guarding, rebound, hernias, masses. Lymphatics: Non tender without lymphadenopathy.  Musculoskeletal: Full ROM, 5/5 strength, Normal gait Skin: Warm, dry without rashes, lesions, ecchymosis.  Neuro: Cranial nerves intact. No cerebellar symptoms.  Psych: Awake and oriented X 3, normal affect, Insight and Judgment  appropriate. Somewhat poor short term memory.   Shannon Ribas, NP 12:09 PM [facility]

## 2017-10-27 ENCOUNTER — Encounter: Payer: Self-pay | Admitting: Adult Health

## 2017-10-27 ENCOUNTER — Ambulatory Visit (INDEPENDENT_AMBULATORY_CARE_PROVIDER_SITE_OTHER): Payer: Medicare HMO | Admitting: Adult Health

## 2017-10-27 ENCOUNTER — Ambulatory Visit: Payer: Self-pay | Admitting: Physician Assistant

## 2017-10-27 VITALS — BP 134/82 | HR 100 | Temp 97.9°F | Ht 66.5 in | Wt 127.0 lb

## 2017-10-27 DIAGNOSIS — E782 Mixed hyperlipidemia: Secondary | ICD-10-CM

## 2017-10-27 DIAGNOSIS — E210 Primary hyperparathyroidism: Secondary | ICD-10-CM | POA: Diagnosis not present

## 2017-10-27 DIAGNOSIS — Z79899 Other long term (current) drug therapy: Secondary | ICD-10-CM | POA: Diagnosis not present

## 2017-10-27 DIAGNOSIS — R7303 Prediabetes: Secondary | ICD-10-CM

## 2017-10-27 DIAGNOSIS — R413 Other amnesia: Secondary | ICD-10-CM | POA: Diagnosis not present

## 2017-10-27 DIAGNOSIS — E559 Vitamin D deficiency, unspecified: Secondary | ICD-10-CM

## 2017-10-27 DIAGNOSIS — I10 Essential (primary) hypertension: Secondary | ICD-10-CM

## 2017-10-27 DIAGNOSIS — Z681 Body mass index (BMI) 19 or less, adult: Secondary | ICD-10-CM

## 2017-10-27 NOTE — Patient Instructions (Signed)
Aim for 7+ servings of fruits and vegetables daily  80+ fluid ounces of water or unsweet tea for healthy kidneys  Limit alcohol intake  Limit animal fats in diet for cholesterol and heart health - choose grass fed whenever available  Aim for low stress - take time to unwind and care for your mental health  Aim for 150 min of moderate intensity exercise weekly for heart health, and weights twice weekly for bone health  Aim for 7-9 hours of sleep daily      When it comes to diets, agreement about the perfect plan isn't easy to find, even among the experts. Experts at the [employer] developed an idea known as the Healthy Eating Plate. Just imagine a plate divided into logical, healthy portions.  The emphasis is on diet quality:  Load up on vegetables and fruits - one-half of your plate: Aim for color and variety, and remember that potatoes don't count.  Go for whole grains - one-quarter of your plate: Whole wheat, barley, wheat berries, quinoa, oats, brown rice, and foods made with them. If you want pasta, go with whole wheat pasta.  Protein power - one-quarter of your plate: Fish, chicken, beans, and nuts are all healthy, versatile protein sources. Limit red meat.  The diet, however, does go beyond the plate, offering a few other suggestions.  Use healthy plant oils, such as olive, canola, soy, corn, sunflower and peanut. Check the labels, and avoid partially hydrogenated oil, which have unhealthy trans fats.  If you're thirsty, drink water. Coffee and tea are good in moderation, but skip sugary drinks and limit milk and dairy products to one or two daily servings.  The type of carbohydrate in the diet is more important than the amount. Some sources of carbohydrates, such as vegetables, fruits, whole grains, and beans-are healthier than others.  Finally, stay active.     Vascular Dementia Dementia is a condition in which a person has problems with  thinking, memory, and behavior that are severe enough to interfere with daily life. Vascular dementia is a type of dementia. It results from brain damage that is caused by the brain not getting enough blood. Vascular dementia usually begins between 76 and 40 years of age. What are the causes? Vascular dementia is caused by conditions that lessen blood flow to the brain. Common causes include:  Multiple small strokes. These may happen without symptoms (silent stroke).  Major stroke.  Damage to small blood vessels in the brain (cerebral small vessel disease).  What increases the risk?  Advancing age.  Having had a stroke.  Having high blood pressure (hypertension) or high cholesterol.  Having a disease that affects the heart or blood vessels.  Smoking.  Having diabetes.  Being female.  Being obese.  Not being active.  Having depression. What are the signs or symptoms? Symptoms can vary a lot from one person to another. Symptoms may be mild or severe depending on the amount of damage and which parts of the brain have been affected. Symptoms may begin suddenly or may develop gradually. Symptoms may remain stable, or they may get worse over time. Symptoms of vascular dementia may be similar to those of Alzheimer disease. The two conditions can occur together (mixed dementia). Symptoms of vascular dementia may include: Mental  Confusion.  Memory problems.  Poor attention and concentration.  Trouble understanding speech.  Depression.  Personality changes.  Trouble recognizing familiar people.  Agitation or aggression.  Paranoia.  Delusions  or hallucinations. Physical  Weakness.  Poor balance.  Loss of bladder or bowel control (incontinence).  Unsteady walking (gait).  Speaking problems. Behavioral  Getting lost in familiar places.  Problems with planning and judgment.  Trouble following instructions.  Social problems.  Emotional  outbursts.  Trouble with daily activities and self-care.  Problems handling money. How is this diagnosed? There is not a specific test to diagnose vascular dementia. The health care provider will consider the person's medical history and symptoms or changes that are reported by friends and family. The health care provider will do a physical exam and may order lab tests or other tests that check brain and nervous system function. Tests that may be done include:  Blood tests.  Brain imaging tests.  Tests of movement, speech, and other daily activities (neurological exam).  Tests of memory, thinking, and problem-solving (neuropsychological or neurocognitive testing).  Diagnosis may involve several specialists. These may include a health care provider who specializes in the brain and nervous system (neurologist), a provider who specializes in disorders of the mind (psychiatrist), and a provider who focuses on speech and language changes (Electrical engineer). How is this treated? There is no cure for vascular dementia. Brain damage that has already occurred cannot be reversed. Treatment depends on:  How severe the condition is.  Which parts of the brain have been affected.  The person's overall health.  Treatment measures aim to:  Treat the underlying cause of vascular dementia and manage risk factors. This may include: ? Controlling blood pressure. ? Lowering cholesterol. ? Treating diabetes. ? Quitting smoking. ? Losing weight.  Manage symptoms.  Prevent further brain damage.  Improve the person's health and quality of life.  Treatment for dementia may involve a team of health care providers, including:  A neurologist.  A psychiatrist.  An occupational therapist.  A speech pathologist.  A cardiologist.  An exercise physiologist or physical therapist.  Follow these instructions at home: Home care for a person with vascular dementia depends on what caused the  condition and how severe the symptoms are. General guidelines for care at home include:  Following the health care provider's instructions for treating the condition that caused the dementia.  Using medicines only as told by the person's health care provider.  Creating a safe living space.  Learning ways to help the person remember people, appointments, and daily activities.  Finding a support group to help caregivers and family to cope with the effects of dementia.  Helping family and friends learn about ways to communicate with a person who has dementia.  Making sure the person keeps all follow-up visits and goes to all rehabilitation appointments as told by the health care team. This is important.  Contact a health care provider if:  A fever develops.  New behavioral problems develop.  Problems with swallowing develop.  Confusion gets worse.  Sleepiness gets worse. Get help right away if:  Loss of consciousness occurs.  There is a sudden loss of speech, balance, or thinking ability.  New numbness or paralysis occurs.  Sudden, severe headache occurs.  Vision is lost or suddenly gets worse in one or both eyes. This information is not intended to replace advice given to you by your health care provider. Make sure you discuss any questions you have with your health care provider. Document Released: 05/17/2002 Document Revised: 11/02/2015 Document Reviewed: 09/07/2014 Elsevier Interactive Patient Education  2018 [employer].

## 2017-10-28 LAB — LIPID PANEL
Cholesterol: 167 mg/dL (ref <200)
HDL: 70 mg/dL (ref >50)
LDL Cholesterol (Calc): 83 mg/dL (calc)
Non-HDL Cholesterol (Calc): 97 mg/dL (calc) (ref <130)
Total CHOL/HDL Ratio: 2.4 (calc) (ref <5.0)
Triglycerides: 65 mg/dL (ref <150)

## 2017-10-28 LAB — CBC WITH DIFFERENTIAL/PLATELET
BASOS ABS: 9 {cells}/uL (ref 0–200)
Basophils Relative: 0.2 %
Eosinophils Absolute: 9 cells/uL — ABNORMAL LOW (ref 15–500)
Eosinophils Relative: 0.2 %
HCT: 44.2 % (ref 35.0–45.0)
Hemoglobin: 15.1 g/dL (ref 11.7–15.5)
Lymphs Abs: 1242 cells/uL (ref 850–3900)
MCH: 31.2 pg (ref 27.0–33.0)
MCHC: 34.2 g/dL (ref 32.0–36.0)
MCV: 91.3 fL (ref 80.0–100.0)
MPV: 9.7 fL (ref 7.5–12.5)
Monocytes Relative: 4.6 %
NEUTROS PCT: 67.4 %
Neutro Abs: 3033 cells/uL (ref 1500–7800)
PLATELETS: 184 10*3/uL (ref 140–400)
RBC: 4.84 10*6/uL (ref 3.80–5.10)
RDW: 11.7 % (ref 11.0–15.0)
TOTAL LYMPHOCYTE: 27.6 %
WBC mixed population: 207 cells/uL (ref 200–950)
WBC: 4.5 10*3/uL (ref 3.8–10.8)

## 2017-10-28 LAB — COMPLETE METABOLIC PANEL WITH GFR
AG Ratio: 1.5 (calc) (ref 1.0–2.5)
ALT: 16 U/L (ref 6–29)
AST: 22 U/L (ref 10–35)
Albumin: 4.5 g/dL (ref 3.6–5.1)
Alkaline phosphatase (APISO): 96 U/L (ref 33–130)
BUN/Creatinine Ratio: 11 (calc) (ref 6–22)
BUN: 12 mg/dL (ref 7–25)
CO2: 31 mmol/L (ref 20–32)
Calcium: 11 mg/dL — ABNORMAL HIGH (ref 8.6–10.4)
Chloride: 106 mmol/L (ref 98–110)
Creat: 1.11 mg/dL — ABNORMAL HIGH (ref 0.60–0.93)
GFR, Est African American: 58 mL/min/{1.73_m2} — ABNORMAL LOW (ref >=60)
GFR, Est Non African American: 50 mL/min/{1.73_m2} — ABNORMAL LOW (ref >=60)
Globulin: 3 g/dL (calc) (ref 1.9–3.7)
Glucose, Bld: 162 mg/dL — ABNORMAL HIGH (ref 65–99)
Potassium: 4.4 mmol/L (ref 3.5–5.3)
Sodium: 143 mmol/L (ref 135–146)
Total Bilirubin: 1.4 mg/dL — ABNORMAL HIGH (ref 0.2–1.2)
Total Protein: 7.5 g/dL (ref 6.1–8.1)

## 2017-10-28 LAB — HEMOGLOBIN A1C
Hgb A1c MFr Bld: 5.6 % of total Hgb (ref <5.7)
Mean Plasma Glucose: 114 (calc)
eAG (mmol/L): 6.3 (calc)

## 2017-10-28 LAB — TSH: TSH: 0.69 mIU/L (ref 0.40–4.50)

## 2017-11-07 ENCOUNTER — Other Ambulatory Visit: Payer: Self-pay | Admitting: Physician Assistant

## 2017-11-17 ENCOUNTER — Institutional Professional Consult (permissible substitution): Payer: Medicare HMO | Admitting: Neurology

## 2017-12-04 ENCOUNTER — Encounter: Payer: Self-pay | Admitting: Internal Medicine

## 2017-12-15 ENCOUNTER — Encounter: Payer: Medicare HMO | Admitting: Psychology

## 2017-12-22 ENCOUNTER — Encounter: Payer: Medicare HMO | Admitting: Psychology

## 2017-12-28 ENCOUNTER — Encounter: Payer: Self-pay | Admitting: Internal Medicine

## 2017-12-28 NOTE — Progress Notes (Signed)
        C  A  N  C  E  L  L  E  D  A  T T  I  M  E           O  F   A  P  P  O  I  N  T  M  E  N  T                                      NEEDS WELLNESS in OCT     This very nice 72 y.o. MBF presents for a Screening /Preventative Visit & comprehensive evaluation and management of multiple medical co-morbidities.  Patient has been followed for HTN, HLD,  Prediabetes  and Vitamin D Deficiency. In 2011, she underwent Bilat Mastectomies for Lt Breast Ca.      In 2001 she had excision of a Parathyroid Adenoma and has had fluctuating PTH & calcium levels since& is followed by Dr Harland German & Dr Lindaann Slough [facility] for suspect recurrent hyperparathyroidism. Last Parathyroid Sestamibi scan in Jan 2018 found no Parathyroid Adenoma.     Patient has had a cognitive evaluation by Dr [name] & MRI finding microvascular changes and mild atrophy. Patient has upcoming sleep evaluation scheduled w/Dr Dohmeier.       HTN predates circa 1990. Patient's BP has been controlled at home and patient denies any cardiac symptoms as chest pain, palpitations, shortness of breath, dizziness or ankle swelling. Today's        Patient's hyperlipidemia is controlled with diet and medications. Patient denies myalgias or other medication SE's. Last lipids were at goal: Lab Results  Component Value Date   CHOL 167 10/27/2017   HDL 70 10/27/2017   LDLCALC 83 10/27/2017   TRIG 65 10/27/2017   CHOLHDL 2.4 10/27/2017      Patient has prediabetes (A1c 6.0% in 2011)  and patient denies reactive hypoglycemic symptoms, visual blurring, diabetic polys, or paresthesias. Last A1c was Normal & at goal: Lab Results  Component Value Date   HGBA1C 5.6 10/27/2017      Finally, patient has history of Vitamin D Deficiency  ("13" in 2008)  and last Vitamin D was still low after dose was tapered in consideration of her fluctuating calciums: Lab Results  Component Value Date   VD25OH  38 11/18/2016

## 2017-12-29 ENCOUNTER — Ambulatory Visit: Payer: Self-pay | Admitting: Internal Medicine

## 2018-01-04 ENCOUNTER — Other Ambulatory Visit: Payer: Self-pay | Admitting: Physician Assistant

## 2018-01-05 ENCOUNTER — Institutional Professional Consult (permissible substitution): Payer: Medicare HMO | Admitting: Neurology

## 2018-01-06 ENCOUNTER — Encounter: Payer: Medicare HMO | Admitting: Psychology

## 2018-02-10 ENCOUNTER — Ambulatory Visit: Payer: Self-pay | Admitting: Adult Health

## 2018-02-10 ENCOUNTER — Encounter: Payer: Self-pay | Admitting: Internal Medicine

## 2018-03-01 NOTE — Progress Notes (Signed)
MEDICARE ANNUAL WELLNESS VISIT  Assessment:   Essential hypertension - continue medications, DASH diet, exercise and monitor at home. Call if greater than 130/80.  Switch ACE to ARB for better BP control and cough -     olmesartan (BENICAR) 40 MG tablet; 1/2 pill daily for BP -     CBC with Differential/Platelet -     BASIC METABOLIC PANEL WITH GFR -     Hepatic function panel -     TSH  Hyperparathyroidism, primary (HCC) Monitor  Hyperlipidemia -continue medications, check lipids, decrease fatty foods, increase activity.  -     Lipid panel  Malignant neoplasm of upper-outer quadrant of left breast in female, estrogen receptor positive (HCC) Monitor  Prediabetes Discussed general issues about diabetes pathophysiology and management., Educational material distributed., Suggested low cholesterol diet., Encouraged aerobic exercise., Discussed foot care., Reminded to get yearly retinal exam. -     Hemoglobin A1c  Medication management  Vitamin D deficiency Continue supplement  Open-angle glaucoma of right eye, unspecified glaucoma stage, unspecified open-angle glaucoma type Continue follow up  Iron deficiency - monitor, continue iron supp with Vitamin C and increase green leafy veggies  Gastroesophageal reflux disease, esophagitis presence not specified Continue PPI/H2 blocker, diet discussed  Body mass index (BMI) of 19.0-19.9 in adult Add protein, work on Editor, commissioning  Memory changes -     Ambulatory referral to Sleep Studies - has seen Dr. [name] Discussed decreasing risks and prevention - better control of BP/chol/start exercise - get sleep study  Needs flu shot -     Flu vaccine HIGH DOSE PF  Sleep apnea, unspecified type -     Ambulatory referral to Sleep Studies   Over 40 minutes of exam, counseling, chart review and critical decision making was performed Future Appointments  Date Time Provider [place]  03/26/2018  3:00 PM Melvenia Beam, MD [facility] None  06/18/2018  1:00 PM Kandis Nab, PsyD [facility] None  06/18/2018  2:00 PM [facility] NEUROPSYCH TECH [facility] None  12/31/2018  9:00 AM Unk Pinto, MD [facility] None     Plan:   During the course of the visit the patient was educated and counseled about appropriate screening and preventive services including:    Pneumococcal vaccine   Prevnar 13  Influenza vaccine  Td vaccine  Screening electrocardiogram  Bone densitometry screening  Colorectal cancer screening  Diabetes screening  Glaucoma screening  Nutrition counseling   Advanced directives: requested   Subjective:  Shannon Nielsen is a 72 y.o. female who presents for Medicare Annual Wellness Visit and complete physical.    She has some memory concerns. She has had MRI brain 10/06/2017 with Dr. [name] that showed microvascular changes. She has a FM HX of dementia on her mother's side. Will put in order for sleep study.   She complains of dry cough, she is on ACE. No fever, chills, no GERD, no sinus issues.   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 She denies chest pain, shortness of breath, dizziness.  She is on cholesterol medication and denies myalgias. Her cholesterol is at goal. The cholesterol last visit was:   Lab Results  Component Value Date   CHOL 167 10/27/2017   HDL 70 10/27/2017   LDLCALC 83 10/27/2017   TRIG 65 10/27/2017   CHOLHDL 2.4 10/27/2017   She  has a history of preDM but has decreased sugar and has increased water. Last A1C in the office was:  Lab Results  Component Value Date   HGBA1C 5.6 10/27/2017   Last GFR: Lab Results  Component Value Date   GFRAA 58 (L) 10/27/2017   Patient is on Vitamin D supplement.   Lab Results  Component Value Date   VD25OH 38 11/18/2016     BMI is Body mass index is 18.92 kg/m., she is  working on diet and exercise. Wt Readings from Last 3 Encounters:  03/03/18 119 lb (54 kg)  10/27/17 127 lb (57.6 kg)  09/24/17 125 lb (56.7 kg)    Medication Review: Current Outpatient Medications on File Prior to Visit  Medication Sig Dispense Refill  . aspirin 81 MG tablet Take 81 mg by mouth daily.     Marland Kitchen atenolol (TENORMIN) 100 MG tablet Take 1 tablet daily for BP 90 tablet 3  . Cholecalciferol (VITAMIN D PO) Take 2,000 Units by mouth daily.     . Dorzolamide HCl-Timolol Mal PF 22.3-6.8 MG/ML SOLN Apply 1 drop to eye 2 (two) times daily. Left eye    . fluticasone (FLONASE) 50 MCG/ACT nasal spray INSTILL 1 SPRAY INTO EACH NOSTRIL TWICE A DAY 48 g 3  . latanoprost (XALATAN) 0.005 % ophthalmic solution Place 1 drop into both eyes at bedtime.    Marland Kitchen letrozole (FEMARA) 2.5 MG tablet letrozole 2.5 mg tablet    . OVER THE COUNTER MEDICATION daily. B-Complex with 1000 mcg Biotin    . quinapril (ACCUPRIL) 20 MG tablet Take 1 tablet (20 mg total) by mouth daily. (Patient taking differently: Take 20 mg by mouth daily. Take 1/2 tablet daily) 90 tablet 1  . rosuvastatin (CRESTOR) 20 MG tablet TAKE 1 TABLET BY MOUTH EVERYDAY AT BEDTIME 90 tablet 1   No current facility-administered medications on file prior to visit.     Allergies  Allergen Reactions  . Minocycline   . Prednisone Swelling  . Sulfa Antibiotics     Patient does not remember the type of reaction, happened years ago.  . Zithromax [Azithromycin] Swelling  . Ciprofloxacin Hcl Rash  . Penicillins Rash    Current Problems (verified) Patient Active Problem List   Diagnosis Date Noted  . Memory changes 10/27/2017  . Iron deficiency 11/30/2016  . Body mass index (BMI) of 19.0-19.9 in adult 05/08/2015  . Open-angle glaucoma 05/08/2015  . Medication management 08/30/2013  . Prediabetes   . Vitamin D deficiency   . GERD (gastroesophageal reflux disease)   . Hyperparathyroidism, primary ([place]) 08/12/2012  . Breast cancer of  upper-outer quadrant of left female breast ([place]) 07/08/2011  . Hyperlipidemia 10/18/2010  . Essential hypertension 10/18/2010    Screening Tests Immunization History  Administered Date(s) Administered  . Influenza, High Dose Seasonal PF 03/07/2014, 05/08/2015, 03/11/2017, 03/03/2018  . PPD Test 08/30/2013, 10/04/2014, 10/24/2015  . Pneumococcal Conjugate-13 03/24/2014  . Pneumococcal Polysaccharide-23 09/08/2006, 03/11/2017  . Tdap 08/27/2012  . Zoster 09/03/2012   Preventative care: Last colonoscopy: 2014 Last mammogram: 2011 s/p double mastectomy Last pap smear/pelvic exam: 2015 declines another DEXA:04/2016 osteopenia Stress test 2012  Prior vaccinations: TD or Tdap: 2014  Influenza: 2017 TODAY Pneumococcal: 2018 Prevnar13: 2015 Shingles/Zostavax: 2014  Names of Other Physician/Practitioners you currently use: 1. [facility] here for primary care 2. Delman Cheadle, eye doctor, last visit 01/2018 3. Dr. Lilian Coma dentist, last visit q 6 months Patient Care Team: Unk Pinto, MD as PCP - General (  Internal Medicine) Clarene Essex, MD as Consulting Physician (Gastroenterology) Meda Klinefelter, MD as Consulting Physician (Endocrinology) Mordecai Rasmussen, MD as Consulting Physician (Surgery)  SURGICAL HISTORY She  has a past surgical history that includes Mastectomy (August 2011); Parathyroidectomy; and Nasal sinus surgery. FAMILY HISTORY Her family history includes Alzheimer's disease in her mother; Cancer in her mother; Dementia in her maternal grandfather and maternal grandmother; Diabetes in her mother; Heart attack in her brother; Heart attack (age of onset: 9) in her father. SOCIAL HISTORY She  reports that she has never smoked. She has never used smokeless tobacco. She reports that she does not drink alcohol or use drugs.   MEDICARE WELLNESS OBJECTIVES: Physical activity: Current Exercise Habits: The patient does not  participate in regular exercise at present Cardiac risk factors: Cardiac Risk Factors include: advanced age (>14men, >73 women);hypertension;dyslipidemia;sedentary lifestyle Depression/mood screen:   Depression screen [facility] 2/9 03/03/2018  Decreased Interest 0  Down, Depressed, Hopeless 0  PHQ - 2 Score 0    ADLs:  In your present state of health, do you have any difficulty performing the following activities: 03/03/2018 12/28/2017  Hearing? N N  Vision? N N  Difficulty concentrating or making decisions? Y Y  Comment - having sleep study to evalute for cognitive dysfunction  Walking or climbing stairs? N N  Dressing or bathing? N N  Doing errands, shopping? N N  Preparing Food and eating ? - -  Using the Toilet? - -  In the past six months, have you accidently leaked urine? - -  Do you have problems with loss of bowel control? - -  Managing your Medications? - -  Managing your Finances? - -  Housekeeping or managing your Housekeeping? - -  Some recent data might be hidden     Cognitive Testing  Alert? Yes  Normal Appearance?Yes  Oriented to person? Yes  Place? Yes   Time? Yes  Recall of three objects?  Yes  Can perform simple calculations? Yes  Displays appropriate judgment?Yes  Can read the correct time from a watch face?Yes  EOL planning: Does Patient Have a Medical Advance Directive?: No Would patient like information on creating a medical advance directive?: Yes (MAU/Ambulatory/Procedural Areas - Information given)  Review of Systems  Constitutional: Negative.   HENT: Negative.   Eyes: Negative.   Respiratory: Negative.   Cardiovascular: Negative.   Gastrointestinal: Negative.   Genitourinary: Negative.   Musculoskeletal: Negative.   Skin: Negative.   Neurological: Negative.   Endo/Heme/Allergies: Negative.   Psychiatric/Behavioral: Negative.      Objective:     Today's Vitals   03/03/18 1125  BP: 132/80  Resp: 14  Temp: 98.5 F (36.9 C)  Weight: 119 lb  (54 kg)  Height: 5' 6.5" (1.689 m)  PainSc: 0-No pain   Body mass index is 18.92 kg/m.  General appearance: alert, no distress, WD/WN, female HEENT: normocephalic, sclerae anicteric, TMs pearly, nares patent, no discharge or erythema, pharynx normal Oral cavity: MMM, no lesions Neck: supple, no lymphadenopathy, no thyromegaly, no masses Heart: RRR, normal S1, S2, no murmurs Lungs: CTA bilaterally, no wheezes, rhonchi, or rales Abdomen: +bs, soft, non tender, non distended, no masses, no hepatomegaly, no splenomegaly Musculoskeletal: nontender, no swelling, no obvious deformity Extremities: no edema, no cyanosis, no clubbing Pulses: 2+ symmetric, upper and lower extremities, normal cap refill Neurological: alert, oriented x 3, CN2-12 intact, strength normal upper extremities and lower extremities, sensation normal throughout, DTRs 2+ throughout, no cerebellar signs, gait normal Psychiatric:  normal affect, behavior normal, pleasant   Medicare Attestation I have personally reviewed: The patient's medical and social history Their use of alcohol, tobacco or illicit drugs Their current medications and supplements The patient's functional ability including ADLs,fall risks, home safety risks, cognitive, and hearing and visual impairment Diet and physical activities Evidence for depression or mood disorders  The patient's weight, height, BMI, and visual acuity have been recorded in the chart.  I have made referrals, counseling, and provided education to the patient based on review of the above and I have provided the patient with a written personalized care plan for preventive services.     Vicie Mutters, PA-C   03/03/2018

## 2018-03-02 ENCOUNTER — Encounter: Payer: Medicare HMO | Admitting: Psychology

## 2018-03-03 ENCOUNTER — Encounter: Payer: Self-pay | Admitting: Physician Assistant

## 2018-03-03 ENCOUNTER — Ambulatory Visit (INDEPENDENT_AMBULATORY_CARE_PROVIDER_SITE_OTHER): Payer: Medicare HMO | Admitting: Physician Assistant

## 2018-03-03 VITALS — BP 132/80 | Temp 98.5°F | Resp 14 | Ht 66.5 in | Wt 119.0 lb

## 2018-03-03 DIAGNOSIS — E210 Primary hyperparathyroidism: Secondary | ICD-10-CM | POA: Diagnosis not present

## 2018-03-03 DIAGNOSIS — H4010X0 Unspecified open-angle glaucoma, stage unspecified: Secondary | ICD-10-CM

## 2018-03-03 DIAGNOSIS — K219 Gastro-esophageal reflux disease without esophagitis: Secondary | ICD-10-CM

## 2018-03-03 DIAGNOSIS — E611 Iron deficiency: Secondary | ICD-10-CM

## 2018-03-03 DIAGNOSIS — G4730 Sleep apnea, unspecified: Secondary | ICD-10-CM

## 2018-03-03 DIAGNOSIS — Z23 Encounter for immunization: Secondary | ICD-10-CM

## 2018-03-03 DIAGNOSIS — R413 Other amnesia: Secondary | ICD-10-CM | POA: Diagnosis not present

## 2018-03-03 DIAGNOSIS — Z0001 Encounter for general adult medical examination with abnormal findings: Secondary | ICD-10-CM

## 2018-03-03 DIAGNOSIS — E782 Mixed hyperlipidemia: Secondary | ICD-10-CM | POA: Diagnosis not present

## 2018-03-03 DIAGNOSIS — C50412 Malignant neoplasm of upper-outer quadrant of left female breast: Secondary | ICD-10-CM | POA: Diagnosis not present

## 2018-03-03 DIAGNOSIS — I10 Essential (primary) hypertension: Secondary | ICD-10-CM

## 2018-03-03 DIAGNOSIS — E559 Vitamin D deficiency, unspecified: Secondary | ICD-10-CM | POA: Diagnosis not present

## 2018-03-03 DIAGNOSIS — Z79899 Other long term (current) drug therapy: Secondary | ICD-10-CM | POA: Diagnosis not present

## 2018-03-03 DIAGNOSIS — R6889 Other general symptoms and signs: Secondary | ICD-10-CM | POA: Diagnosis not present

## 2018-03-03 DIAGNOSIS — Z681 Body mass index (BMI) 19 or less, adult: Secondary | ICD-10-CM | POA: Diagnosis not present

## 2018-03-03 DIAGNOSIS — Z0000 Encounter for general adult medical examination without abnormal findings: Secondary | ICD-10-CM

## 2018-03-03 DIAGNOSIS — Z170 Estrogen receptor positive status [ER+]: Secondary | ICD-10-CM

## 2018-03-03 DIAGNOSIS — R7303 Prediabetes: Secondary | ICD-10-CM | POA: Diagnosis not present

## 2018-03-03 MED ORDER — OLMESARTAN MEDOXOMIL 40 MG PO TABS: ORAL_TABLET | ORAL | 1 refills | Status: DC

## 2018-03-03 NOTE — Patient Instructions (Addendum)
Please stop the quinapril due to COUGH and we will start you on benicar 40mg - start 1/2 pill a day and increase to a whole pill if your BP is above 130/80  HYPERTENSION INFORMATION  Monitor your blood pressure at home, please keep a record and bring that in with you to your next office visit.   Go to the ER if any CP, SOB, nausea, dizziness, severe HA, changes vision/speech  Due to a recent study, SPRINT, we have changed our goal for the systolic or top blood pressure number. Ideally we want your top number at 120.  In the [facility] Trial, 5000 people were randomized to a goal BP of 120 and 5000 people were randomized to a goal BP of less than 140. The patients with the goal BP at 120 had LESS DEMENTIA, LESS HEART ATTACKS, AND LESS STROKES, AS WELL AS OVERALL DECREASED MORTALITY OR DEATH RATE.   If you are willing, our goal BP is the top number of 120.  Your most recent BP: BP: 132/80   Take your medications faithfully as instructed. Maintain a healthy weight. Get at least 150 minutes of aerobic exercise per week. Minimize salt intake. Minimize alcohol intake  DASH Eating Plan DASH stands for "Dietary Approaches to Stop Hypertension." The DASH eating plan is a healthy eating plan that has been shown to reduce high blood pressure (hypertension). Additional health benefits may include reducing the risk of type 2 diabetes mellitus, heart disease, and stroke. The DASH eating plan may also help with weight loss. WHAT DO I NEED TO KNOW ABOUT THE DASH EATING PLAN? For the DASH eating plan, you will follow these general guidelines:  Choose foods with a percent daily value for sodium of less than 5% (as listed on the food label).  Use salt-free seasonings or herbs instead of table salt or sea salt.  Check with your health care provider or pharmacist before using salt substitutes.  Eat lower-sodium products, often labeled as "lower sodium" or "no salt added."  Eat fresh foods.  Eat more  vegetables, fruits, and low-fat dairy products.  Choose whole grains. Look for the word "whole" as the first word in the ingredient list.  Choose fish and skinless chicken or [place] more often than red meat. Limit fish, poultry, and meat to 6 oz (170 g) each day.  Limit sweets, desserts, sugars, and sugary drinks.  Choose heart-healthy fats.  Limit cheese to 1 oz (28 g) per day.  Eat more home-cooked food and less restaurant, buffet, and fast food.  Limit fried foods.  Cook foods using methods other than frying.  Limit canned vegetables. If you do use them, rinse them well to decrease the sodium.  When eating at a restaurant, ask that your food be prepared with less salt, or no salt if possible. WHAT FOODS CAN I EAT? Seek help from a dietitian for individual calorie needs. Grains Whole grain or whole wheat bread. Brown rice. Whole grain or whole wheat pasta. Quinoa, bulgur, and whole grain cereals. Low-sodium cereals. Corn or whole wheat flour tortillas. Whole grain cornbread. Whole grain crackers. Low-sodium crackers. Vegetables Fresh or frozen vegetables (raw, steamed, roasted, or grilled). Low-sodium or reduced-sodium tomato and vegetable juices. Low-sodium or reduced-sodium tomato sauce and paste. Low-sodium or reduced-sodium canned vegetables.  Fruits All fresh, canned (in natural juice), or frozen fruits. Meat and Other Protein Products Ground beef (85% or leaner), grass-fed beef, or beef trimmed of fat. Skinless chicken or [place]. Ground chicken or [place]. Pork trimmed  of fat. All fish and seafood. Eggs. Dried beans, peas, or lentils. Unsalted nuts and seeds. Unsalted canned beans. Dairy Low-fat dairy products, such as skim or 1% milk, 2% or reduced-fat cheeses, low-fat ricotta or cottage cheese, or plain low-fat yogurt. Low-sodium or reduced-sodium cheeses. Fats and Oils Tub margarines without trans fats. Light or reduced-fat mayonnaise and salad dressings (reduced sodium).  Avocado. Safflower, olive, or canola oils. Natural peanut or almond butter. Other Unsalted popcorn and pretzels. The items listed above may not be a complete list of recommended foods or beverages. Contact your dietitian for more options. WHAT FOODS ARE NOT RECOMMENDED? Grains White bread. White pasta. White rice. Refined cornbread. Bagels and croissants. Crackers that contain trans fat. Vegetables Creamed or fried vegetables. Vegetables in a cheese sauce. Regular canned vegetables. Regular canned tomato sauce and paste. Regular tomato and vegetable juices. Fruits Dried fruits. Canned fruit in light or heavy syrup. Fruit juice. Meat and Other Protein Products Fatty cuts of meat. Ribs, chicken wings, bacon, sausage, bologna, salami, chitterlings, fatback, hot dogs, bratwurst, and packaged luncheon meats. Salted nuts and seeds. Canned beans with salt. Dairy Whole or 2% milk, cream, half-and-half, and cream cheese. Whole-fat or sweetened yogurt. Full-fat cheeses or blue cheese. Nondairy creamers and whipped toppings. Processed cheese, cheese spreads, or cheese curds. Condiments Onion and garlic salt, seasoned salt, table salt, and sea salt. Canned and packaged gravies. Worcestershire sauce. Tartar sauce. Barbecue sauce. Teriyaki sauce. Soy sauce, including reduced sodium. Steak sauce. Fish sauce. Oyster sauce. Cocktail sauce. Horseradish. Ketchup and mustard. Meat flavorings and tenderizers. Bouillon cubes. Hot sauce. Tabasco sauce. Marinades. Taco seasonings. Relishes. Fats and Oils Butter, stick margarine, lard, shortening, ghee, and bacon fat. Coconut, palm kernel, or palm oils. Regular salad dressings. Other Pickles and olives. Salted popcorn and pretzels. The items listed above may not be a complete list of foods and beverages to avoid. Contact your dietitian for more information. WHERE CAN I FIND MORE INFORMATION? National Heart, Lung, and Blood Institute:  [employer]stabl[employer] Document Released: 05/16/2011 Document Revised: 10/11/2013 Document Reviewed: 03/31/2013 [facility] Patient Information 2015 [place], [place]. This information is not intended to replace advice given to you by your health care provider. Make sure you discuss any questions you have with your health care provider.      Vascular Dementia Dementia is a condition in which a person has problems with thinking, memory, and behavior that are severe enough to interfere with daily life. Vascular dementia is a type of dementia. It results from brain damage that is caused by the brain not getting enough blood. Vascular dementia usually begins between 74 and 75 years of age. What are the causes? Vascular dementia is caused by conditions that lessen blood flow to the brain. Common causes include:  Multiple small strokes. These may happen without symptoms (silent stroke).  Major stroke.  Damage to small blood vessels in the brain (cerebral small vessel disease).  What increases the risk?  Advancing age.  Having had a stroke.  Having high blood pressure (hypertension) or high cholesterol.  Having a disease that affects the heart or blood vessels.  Smoking.  Having diabetes.  Being female.  Being obese.  Not being active.  Having depression. What are the signs or symptoms? Symptoms can vary a lot from one person to another. Symptoms may be mild or severe depending on the amount of damage and which parts of the brain have been affected. Symptoms may begin suddenly or may develop gradually. Symptoms may remain stable, or they  may get worse over time. Symptoms of vascular dementia may be similar to those of Alzheimer disease. The two conditions can occur together (mixed dementia). Symptoms of vascular dementia may include: Mental  Confusion.  Memory problems.  Poor attention and concentration.  Trouble understanding  speech.  Depression.  Personality changes.  Trouble recognizing familiar people.  Agitation or aggression.  Paranoia.  Delusions or hallucinations. Physical  Weakness.  Poor balance.  Loss of bladder or bowel control (incontinence).  Unsteady walking (gait).  Speaking problems. Behavioral  Getting lost in familiar places.  Problems with planning and judgment.  Trouble following instructions.  Social problems.  Emotional outbursts.  Trouble with daily activities and self-care.  Problems handling money. How is this diagnosed? There is not a specific test to diagnose vascular dementia. The health care provider will consider the person's medical history and symptoms or changes that are reported by friends and family. The health care provider will do a physical exam and may order lab tests or other tests that check brain and nervous system function. Tests that may be done include:  Blood tests.  Brain imaging tests.  Tests of movement, speech, and other daily activities (neurological exam).  Tests of memory, thinking, and problem-solving (neuropsychological or neurocognitive testing).  Diagnosis may involve several specialists. These may include a health care provider who specializes in the brain and nervous system (neurologist), a provider who specializes in disorders of the mind (psychiatrist), and a provider who focuses on speech and language changes (Electrical engineer). How is this treated? There is no cure for vascular dementia. Brain damage that has already occurred cannot be reversed. Treatment depends on:  How severe the condition is.  Which parts of the brain have been affected.  The person's overall health.  Treatment measures aim to:  Treat the underlying cause of vascular dementia and manage risk factors. This may include: ? Controlling blood pressure. ? Lowering cholesterol. ? Treating diabetes. ? Quitting smoking. ? Losing weight.  Manage  symptoms.  Prevent further brain damage.  Improve the person's health and quality of life.  Treatment for dementia may involve a team of health care providers, including:  A neurologist.  A psychiatrist.  An occupational therapist.  A speech pathologist.  A cardiologist.  An exercise physiologist or physical therapist.  Follow these instructions at home: Home care for a person with vascular dementia depends on what caused the condition and how severe the symptoms are. General guidelines for care at home include:  Following the health care provider's instructions for treating the condition that caused the dementia.  Using medicines only as told by the person's health care provider.  Creating a safe living space.  Learning ways to help the person remember people, appointments, and daily activities.  Finding a support group to help caregivers and family to cope with the effects of dementia.  Helping family and friends learn about ways to communicate with a person who has dementia.  Making sure the person keeps all follow-up visits and goes to all rehabilitation appointments as told by the health care team. This is important.  Contact a health care provider if:  A fever develops.  New behavioral problems develop.  Problems with swallowing develop.  Confusion gets worse.  Sleepiness gets worse. Get help right away if:  Loss of consciousness occurs.  There is a sudden loss of speech, balance, or thinking ability.  New numbness or paralysis occurs.  Sudden, severe headache occurs.  Vision is lost or suddenly gets  worse in one or both eyes. This information is not intended to replace advice given to you by your health care provider. Make sure you discuss any questions you have with your health care provider. Document Released: 05/17/2002 Document Revised: 11/02/2015 Document Reviewed: 09/07/2014 [employer] Interactive Patient Education  2018 [employer].

## 2018-03-04 LAB — CBC WITH DIFFERENTIAL/PLATELET
BASOS ABS: 20 {cells}/uL (ref 0–200)
Basophils Relative: 0.5 %
EOS PCT: 1 %
Eosinophils Absolute: 39 cells/uL (ref 15–500)
HCT: 45.9 % — ABNORMAL HIGH (ref 35.0–45.0)
Hemoglobin: 15.6 g/dL — ABNORMAL HIGH (ref 11.7–15.5)
Lymphs Abs: 1014 cells/uL (ref 850–3900)
MCH: 31.2 pg (ref 27.0–33.0)
MCHC: 34 g/dL (ref 32.0–36.0)
MCV: 91.8 fL (ref 80.0–100.0)
MONOS PCT: 6.9 %
MPV: 9.7 fL (ref 7.5–12.5)
NEUTROS ABS: 2558 {cells}/uL (ref 1500–7800)
NEUTROS PCT: 65.6 %
Platelets: 178 10*3/uL (ref 140–400)
RBC: 5 10*6/uL (ref 3.80–5.10)
RDW: 11.8 % (ref 11.0–15.0)
Total Lymphocyte: 26 %
WBC mixed population: 269 cells/uL (ref 200–950)
WBC: 3.9 10*3/uL (ref 3.8–10.8)

## 2018-03-04 LAB — COMPLETE METABOLIC PANEL WITH GFR
AG RATIO: 1.6 (calc) (ref 1.0–2.5)
ALKALINE PHOSPHATASE (APISO): 111 U/L (ref 33–130)
ALT: 22 U/L (ref 6–29)
AST: 25 U/L (ref 10–35)
Albumin: 4.5 g/dL (ref 3.6–5.1)
BILIRUBIN TOTAL: 1.4 mg/dL — AB (ref 0.2–1.2)
BUN/Creatinine Ratio: 10 (calc) (ref 6–22)
BUN: 10 mg/dL (ref 7–25)
CALCIUM: 11 mg/dL — AB (ref 8.6–10.4)
CHLORIDE: 105 mmol/L (ref 98–110)
CO2: 27 mmol/L (ref 20–32)
Creat: 1.02 mg/dL — ABNORMAL HIGH (ref 0.60–0.93)
GFR, EST NON AFRICAN AMERICAN: 56 mL/min/{1.73_m2} — AB (ref >=60)
GFR, Est African American: 65 mL/min/{1.73_m2} (ref >=60)
GLOBULIN: 2.9 g/dL (ref 1.9–3.7)
Glucose, Bld: 106 mg/dL — ABNORMAL HIGH (ref 65–99)
Potassium: 4.3 mmol/L (ref 3.5–5.3)
SODIUM: 141 mmol/L (ref 135–146)
Total Protein: 7.4 g/dL (ref 6.1–8.1)

## 2018-03-04 LAB — LIPID PANEL
Cholesterol: 165 mg/dL (ref <200)
HDL: 74 mg/dL (ref >50)
LDL Cholesterol (Calc): 76 mg/dL (calc)
NON-HDL CHOLESTEROL (CALC): 91 mg/dL (ref <130)
Total CHOL/HDL Ratio: 2.2 (calc) (ref <5.0)
Triglycerides: 73 mg/dL (ref <150)

## 2018-03-04 LAB — HEMOGLOBIN A1C
Hgb A1c MFr Bld: 5.7 % of total Hgb — ABNORMAL HIGH (ref <5.7)
Mean Plasma Glucose: 117 (calc)
eAG (mmol/L): 6.5 (calc)

## 2018-03-04 LAB — TSH: TSH: 0.66 mIU/L (ref 0.40–4.50)

## 2018-03-08 ENCOUNTER — Encounter: Payer: Self-pay | Admitting: Internal Medicine

## 2018-03-08 DIAGNOSIS — Z1159 Encounter for screening for other viral diseases: Secondary | ICD-10-CM

## 2018-03-08 DIAGNOSIS — Z9189 Other specified personal risk factors, not elsewhere classified: Secondary | ICD-10-CM

## 2018-03-08 HISTORY — DX: Other specified personal risk factors, not elsewhere classified: Z11.59

## 2018-03-08 HISTORY — DX: Other specified personal risk factors, not elsewhere classified: Z91.89

## 2018-03-13 DIAGNOSIS — H401131 Primary open-angle glaucoma, bilateral, mild stage: Secondary | ICD-10-CM | POA: Diagnosis not present

## 2018-03-20 DIAGNOSIS — H401131 Primary open-angle glaucoma, bilateral, mild stage: Secondary | ICD-10-CM | POA: Diagnosis not present

## 2018-03-26 ENCOUNTER — Encounter: Payer: Medicare HMO | Admitting: Psychology

## 2018-03-26 ENCOUNTER — Ambulatory Visit: Payer: Medicare HMO | Admitting: Neurology

## 2018-04-03 DIAGNOSIS — H401131 Primary open-angle glaucoma, bilateral, mild stage: Secondary | ICD-10-CM | POA: Diagnosis not present

## 2018-05-06 DIAGNOSIS — H401131 Primary open-angle glaucoma, bilateral, mild stage: Secondary | ICD-10-CM | POA: Diagnosis not present

## 2018-05-11 ENCOUNTER — Ambulatory Visit: Payer: Medicare HMO | Admitting: Neurology

## 2018-05-26 DIAGNOSIS — H401131 Primary open-angle glaucoma, bilateral, mild stage: Secondary | ICD-10-CM | POA: Diagnosis not present

## 2018-06-18 ENCOUNTER — Encounter: Payer: Medicare HMO | Admitting: Psychology

## 2018-06-18 ENCOUNTER — Encounter

## 2018-06-22 ENCOUNTER — Encounter: Payer: Self-pay | Admitting: Internal Medicine

## 2018-06-22 ENCOUNTER — Ambulatory Visit: Payer: Self-pay | Admitting: Internal Medicine

## 2018-06-22 DIAGNOSIS — E538 Deficiency of other specified B group vitamins: Secondary | ICD-10-CM | POA: Insufficient documentation

## 2018-06-22 DIAGNOSIS — R7309 Other abnormal glucose: Secondary | ICD-10-CM | POA: Insufficient documentation

## 2018-06-22 DIAGNOSIS — R413 Other amnesia: Secondary | ICD-10-CM

## 2018-06-22 DIAGNOSIS — F0150 Vascular dementia without behavioral disturbance: Secondary | ICD-10-CM | POA: Insufficient documentation

## 2018-06-22 NOTE — Patient Instructions (Signed)
Dementia Caregiver Guide  Dementia is a term used to describe a number of symptoms that affect memory and thinking. The most common symptoms include:  Memory loss.  Trouble with language and communication.  Trouble concentrating.  Poor judgment.  Problems with reasoning.  Child-like behavior and language.  Extreme anxiety.  Angry outbursts. Wandering from home or public places.  Dementia usually gets worse slowly over time. In the early stages, people with dementia can stay independent and safe with some help. In later stages, they need help with daily tasks such as dressing, grooming, and using the bathroom. How to help the person with dementia cope Dementia can be frightening and confusing. Here are some tips to help the person with dementia cope with changes caused by the disease. General tips  Keep the person on track with his or her routine.  Try to identify areas where the person may need help.  Be supportive, patient, calm, and encouraging.  Gently remind the person that adjusting to changes takes time.  Help with the tasks that the person has asked for help with.  Keep the person involved in daily tasks and decisions as much as possible.  Encourage conversation, but try not to get frustrated or harried if the person struggles to find words or does not seem to appreciate your help. Communication tips  When the person is talking or seems frustrated, make eye contact and hold the person's hand.  Ask specific questions that need yes or no answers.  Use simple words, short sentences, and a calm voice. Only give one direction at a time.  When offering choices, limit them to just 1 or 2.  Avoid correcting the person in a negative way.  If the person is struggling to find the right words, gently try to help him or her. How to recognize symptoms of stress Symptoms of stress in caregivers include:  Feeling frustrated or angry with the person with  dementia.  Denying that the person has dementia or that his or her symptoms will not improve.  Feeling hopeless and unappreciated.  Difficulty sleeping.  Difficulty concentrating.  Feeling anxious, irritable, or depressed.  Developing stress-related health problems.  Feeling like you have too little time for your own life. Follow these instructions at home:   Make sure that you and the person you are caring for: ? Get regular sleep. ? Exercise regularly. ? Eat regular, nutritious meals. ? Drink enough fluid to keep your urine clear or pale yellow. ? Take over-the-counter and prescription medicines only as told by your health care providers. ? Attend all scheduled health care appointments.  Join a support group with others who are caregivers.  Ask about respite care resources so that you can have a regular break from the stress of caregiving.  Look for signs of stress in yourself and in the person you are caring for. If you notice signs of stress, take steps to manage it.  Consider any safety risks and take steps to avoid them.  Organize medications in a pill box for each day of the week.  Create a plan to handle any legal or financial matters. Get legal or financial advice if needed.  Keep a calendar in a central location to remind the person of appointments or other activities. Tips for reducing the risk of injury  Keep floors clear of clutter. Remove rugs, magazine racks, and floor lamps.  Keep hallways well lit, especially at night.  Put a handrail and nonslip mat in the bathtub  or shower.  Put childproof locks on cabinets that contain dangerous items, such as medicines, alcohol, guns, toxic cleaning items, sharp tools or utensils, matches, and lighters.  Put the locks in places where the person cannot see or reach them easily. This will help ensure that the person does not wander out of the house and get lost.  Be prepared for emergencies. Keep a list of  emergency phone numbers and addresses in a convenient area.  Remove car keys and lock garage doors so that the person does not try to get in the car and drive.  Have the person wear a bracelet that tracks locations and identifies the person as having memory problems. This should be worn at all times for safety. Where to find support: Many individuals and organizations offer support. These include:  Support groups for people with dementia and for caregivers.  Counselors or therapists.  Home health care services.  Adult day care centers. Where to find more information [employer]: [employer] Contact a health care provider if:  The person's health is rapidly getting worse.  You are no longer able to care for the person.  Caring for the person is affecting your physical and emotional health.  The person threatens himself or herself, you, or anyone else. Summary  Dementia is a term used to describe a number of symptoms that affect memory and thinking.  Dementia usually gets worse slowly over time.  Take steps to reduce the person's risk of injury, and to plan for future care.  Caregivers need support, relief from caregiving, and time for their own lives. This information is not intended to replace advice given to you by your health care provider. Make sure you discuss any questions you have with your health care provider. +++++++++++++++++++++++++++++++++++++++++++++++++++++++++++++++++++  Dementia  Dementia is the loss of two or more brain functions, such as:  Memory.  Decision making.  Behavior.  Speaking.  Thinking.  Problem solving. There are many types of dementia. The most common type is called progressive dementia. Progressive dementia gets worse with time and it is irreversible. An example of this type of dementia is Alzheimer disease. What are the causes? This condition may be caused by:  Nerve cell damage in the brain.  Genetic  mutations.  Certain medicines.  Multiple small strokes.  An infection, such as chronic meningitis.  A metabolic problem, such as vitamin B12 deficiency or thyroid disease.  Pressure on the brain, such as from a tumor or blood clot. What are the signs or symptoms? Symptoms of this condition include:  Sudden changes in mood.  Depression.  Problems with balance.  Changes in personality.  Poor short-term memory.  Agitation.  Delusions.  Hallucinations.  Having a hard time: ? Speaking thoughts. ? Finding words. ? Solving problems. ? Doing familiar tasks. ? Understanding familiar ideas. How is this diagnosed? This condition is diagnosed with an assessment by your health care provider. During this assessment, your health care provider will talk with you and your family, friends, or caregivers about your symptoms. A thorough medical history will be taken, and you will have a physical exam and tests. Tests may include:  Lab tests, such as blood or urine tests.  Imaging tests, such as a CT scan, PET scan, or MRI.  A lumbar puncture. This test involves removing and testing a small amount of the fluid that surrounds the brain and spinal cord.  An electroencephalogram (EEG). In this test, small metal discs are used to measure electrical activity  in the brain.  Memory tests, cognitive tests, and neuropsychological tests. These tests evaluate brain function. How is this treated? Treatment depends on the cause of the dementia. It may involve taking medicines that may help:  To control the dementia.  To slow down the disease.  To manage symptoms. In some cases, treating the cause of the dementia can improve symptoms, reverse symptoms, or slow down how quickly the dementia gets worse. Your health care provider can help direct you to support groups, organizations, and other health care providers who can help with decisions about your care. Follow these instructions at  home: Medicine  Take over-the-counter and prescription medicines only as told by your health care provider.  Avoid taking medicines that can affect thinking, such as pain or sleeping medicines. Lifestyle   Make healthy lifestyle choices: ? Be physically active as told by your health care provider. ? Do not use any tobacco products, such as cigarettes, chewing tobacco, and e-cigarettes. If you need help quitting, ask your health care provider. ? Eat a healthy diet. ? Practice stress-management techniques when you get stressed. ? Stay social.  Drink enough fluid to keep your urine clear or pale yellow.  Make sure to get quality sleep. These tips can help you to get a good night's rest: ? Avoid napping during the day. ? Keep your sleeping area dark and cool. ? Avoid exercising during the few hours before you go to bed. ? Avoid caffeine products in the evening. General instructions  Work with your health care provider to determine what you need help with and what your safety needs are.  If you were given a bracelet that tracks your location, make sure to wear it.  Keep all follow-up visits as told by your health care provider. This is important. Contact a health care provider if:  You have any new symptoms.  You have problems with choking or swallowing.  You have any symptoms of a different illness. Get help right away if:  You develop a fever.  You have new or worsening confusion.  You have new or worsening sleepiness.  You have a hard time staying awake.  You or your family members become concerned for your safety. +++++++++++++++++++++++++++++++++++++++++++++++++++ Confusion Confusion is the inability to think with the usual speed or clarity. People who are confused often describe their thinking as cloudy or unclear. Confusion can also include feeling disoriented. This means you are unaware of where you are or who you are. You may also not know the date or time. When  confused, you may have difficulty remembering, paying attention, or making decisions. Some people also act aggressively when they are confused. In some cases, confusion may come on quickly. In other cases, it may develop slowly over time. How quickly confusion comes on depends on the cause. Confusion may be caused by:  Head injury (concussion).  Seizures.  Stroke.  Fever.  Brain tumor.  Decrease in brain function due to a vascular or neurologic condition (dementia).  Emotions, like rage or terror.  Inability to know what is real and what is not (hallucinations).  Infections, such as a urinary tract infection (UTI).  Using too much alcohol, drugs, or medicines.  Loss of fluid (dehydration) or an imbalance of salts in the body (electrolytes).  Lack of sleep.  Low blood sugar (diabetes).  Low levels of oxygen. This comes from conditions such as chronic lung disorders.  Side effects of medicines, or taking medicines that affect other medicines (drug interactions).  Lack of certain nutrients, especially niacin, thiamine, vitamin C, or vitamin B.  Sudden drop in body temperature (hypothermia).  Change in routine, such as traveling or being hospitalized. Follow these instructions at home: Pay attention to your symptoms. Tell your health care provider about any changes or if you develop new symptoms. Follow these instructions to control or treat symptoms. Ask a family member or friend for help if needed. Medicines  Take over-the-counter and prescription medicines only as told by your health care provider.  Ask your health care provider about changing or stopping any medicines that may be causing your confusion.  Avoid pain medicines or sleep medicines until you have fully recovered.  Use a pillbox or an alarm to help you take the right medicines at the right time. Lifestyle   Eat a balanced diet that includes fruits and vegetables.  Get enough sleep. For most adults,  this is 7-9 hours each night.  Do not drink alcohol.  Do not become isolated. Spend time with other people and make plans for your days.  Do not drive until your health care provider says that it is safe to do so.  Do not use any products that contain nicotine or tobacco, such as cigarettes and e-cigarettes. If you need help quitting, ask your health care provider.  Stop other activities that may increase your chances of getting hurt. These may include some work duties, sports activities, swimming, or bike riding. Ask your health care provider what activities are safe for you. What caregivers can do  Find out if the person is confused. Ask the person to state his or her name, age, and the date. If the person is unsure or answers incorrectly, he or she may be confused.  Always introduce yourself, no matter how well the person knows you.  Remind the person of his or her location. Do this often.  Place a calendar and clock near the person who is confused.  Talk about current events and plans for the day.  Keep the environment calm, quiet, and peaceful.  Help the person do the things that he or she is unable to do. These include: ? Taking medicines. ? Keeping follow-up visits with his or her health care provider. ? Helping with household duties, including meal preparation. ? Running errands.  Get help if you need it. There are several support groups for caregivers.  If the person you are helping needs more support, consider day care, extended care programs, or a skilled nursing facility. The person's health care provider may be able to help evaluate these options. General instructions  Monitor yourself for any conditions you may have. These may include: ? Checking your blood glucose levels, if you have diabetes. ? Watching your weight, if you are overweight. ? Monitoring your blood pressure, if you have hypertension. ? Monitoring your body temperature, if you have a fever.  Keep  all follow-up visits as told by your health care provider. This is important. Contact a health care provider if:  Your symptoms get worse. Get help right away if you:  Feel that you are not able to care for yourself.  Develop severe headaches, repeated vomiting, seizures, blackouts, or slurred speech.  Have increasing confusion, weakness, numbness, restlessness, or personality changes.  Develop a loss of balance, have marked dizziness, feel uncoordinated, or fall.  Develop severe anxiety, or you have delusions or hallucinations. These symptoms may represent a serious problem that is an emergency. Do not wait to see if the symptoms  will go away. Get medical help right away. Call your local emergency services (911 in the [place] Do not drive yourself to the hospital. Summary  Confusion is the inability to think with the usual speed or clarity. People who are confused often describe their thinking as cloudy or unclear.  Confusion can also include having difficulty remembering, paying attention, or making decisions.  Confusion may come on quickly or develop slowly over time, depending on the cause. There are many different causes of confusion.  Ask for help from family members or friends if you are unable to take care of yourself. +++++++++++++++++++++++++++++++++++++++++++++++++++++++++++  Recommend Adult Low Dose Aspirin or  coated  Aspirin 81 mg daily  To reduce risk of Colon Cancer 20 %,  Skin Cancer 26 % ,  Melanoma 46%  and  Pancreatic cancer 60%  +++++++++++++++++++++++++  Vitamin D goal   is between 70-100.  Please make sure that you are taking your Vitamin D as directed.  It is very important as a natural anti-inflammatory  helping hair, skin, and nails, as well as reducing stroke and heart attack risk.  It helps your bones and helps with mood. It also decreases numerous cancer risks so please take it as directed.  Low Vit D is associated with a 200-300% higher risk  for CANCER  and 200-300% higher risk for HEART   ATTACK  &  STROKE.   .....................................Marland Kitchen It is also associated with higher death rate at younger ages,  autoimmune diseases like Rheumatoid arthritis, Lupus, Multiple Sclerosis.    Also many other serious conditions, like depression, Alzheimer's Dementia, infertility, muscle aches, fatigue, fibromyalgia - just to name a few.  ++++++++++++++++++++  Recommend the book "The END of DIETING" by Dr Excell Seltzer  & the book "The END of DIABETES " by Dr Excell Seltzer At [facility]com - get book & Audio CD's    Being diabetic has a  300% increased risk for heart attack, stroke, cancer, and alzheimer- type vascular dementia. It is very important that you work harder with diet by avoiding all foods that are white. Avoid white rice (brown & wild rice is OK), white potatoes (sweetpotatoes in moderation is OK), White bread or wheat bread or anything made out of white flour like bagels, donuts, rolls, buns, biscuits, cakes, pastries, cookies, pizza crust, and pasta (made from white flour & egg whites) - vegetarian pasta or spinach or wheat pasta is OK. Multigrain breads like Arnold's or Pepper[employer], or multigrain sandwich thins or flatbreads.  Diet, exercise and weight loss can reverse and cure diabetes in the early stages.  Diet, exercise and weight loss is very important in the control and prevention of complications of diabetes which affects every system in your body, ie. Brain - dementia/stroke, eyes - glaucoma/blindness, heart - heart attack/heart failure, kidneys - dialysis, stomach - gastric paralysis, intestines - malabsorption, nerves - severe painful neuritis, circulation - gangrene & loss of a leg(s), and finally cancer and Alzheimers.    I recommend avoid fried & greasy foods,  sweets/candy, white rice (brown or wild rice or Quinoa is OK), white potatoes (sweet potatoes are OK) - anything made from white flour - bagels, doughnuts, rolls,  buns, biscuits,white and wheat breads, pizza crust and traditional pasta made of white flour & egg white(vegetarian pasta or spinach or wheat pasta is OK).  Multi-grain bread is OK - like multi-grain flat bread or sandwich thins. Avoid alcohol in excess. Exercise is also important.    Eat all the  vegetables you want - avoid meat, especially red meat and dairy - especially cheese.  Cheese is the most concentrated form of trans-fats which is the worst thing to clog up our arteries. Veggie cheese is OK which can be found in the fresh produce section at [employer] or [employer] or [employer]  +++++++++++++++++++++ DASH Eating Plan  DASH stands for "Dietary Approaches to Stop Hypertension."   The DASH eating plan is a healthy eating plan that has been shown to reduce high blood pressure (hypertension). Additional health benefits may include reducing the risk of type 2 diabetes mellitus, heart disease, and stroke. The DASH eating plan may also help with weight loss. WHAT DO I NEED TO KNOW ABOUT THE DASH EATING PLAN? For the DASH eating plan, you will follow these general guidelines:  Choose foods with a percent daily value for sodium of less than 5% (as listed on the food label).  Use salt-free seasonings or herbs instead of table salt or sea salt.  Check with your health care provider or pharmacist before using salt substitutes.  Eat lower-sodium products, often labeled as "lower sodium" or "no salt added."  Eat fresh foods.  Eat more vegetables, fruits, and low-fat dairy products.  Choose whole grains. Look for the word "whole" as the first word in the ingredient list.  Choose fish   Limit sweets, desserts, sugars, and sugary drinks.  Choose heart-healthy fats.  Eat veggie cheese   Eat more home-cooked food and less restaurant, buffet, and fast food.  Limit fried foods.  Cook foods using methods other than frying.  Limit canned vegetables. If you do use them, rinse them well  to decrease the sodium.  When eating at a restaurant, ask that your food be prepared with less salt, or no salt if possible.                      WHAT FOODS CAN I EAT? Read Dr Fara Olden Fuhrman's books on The End of Dieting & The End of Diabetes  Grains Whole grain or whole wheat bread. Brown rice. Whole grain or whole wheat pasta. Quinoa, bulgur, and whole grain cereals. Low-sodium cereals. Corn or whole wheat flour tortillas. Whole grain cornbread. Whole grain crackers. Low-sodium crackers.  Vegetables Fresh or frozen vegetables (raw, steamed, roasted, or grilled). Low-sodium or reduced-sodium tomato and vegetable juices. Low-sodium or reduced-sodium tomato sauce and paste. Low-sodium or reduced-sodium canned vegetables.   Fruits All fresh, canned (in natural juice), or frozen fruits.  Protein Products  All fish and seafood.  Dried beans, peas, or lentils. Unsalted nuts and seeds. Unsalted canned beans.  Dairy Low-fat dairy products, such as skim or 1% milk, 2% or reduced-fat cheeses, low-fat ricotta or cottage cheese, or plain low-fat yogurt. Low-sodium or reduced-sodium cheeses.  Fats and Oils Tub margarines without trans fats. Light or reduced-fat mayonnaise and salad dressings (reduced sodium). Avocado. Safflower, olive, or canola oils. Natural peanut or almond butter.  Other Unsalted popcorn and pretzels. The items listed above may not be a complete list of recommended foods or beverages. Contact your dietitian for more options.  +++++++++++++++  WHAT FOODS ARE NOT RECOMMENDED? Grains/ White flour or wheat flour White bread. White pasta. White rice. Refined cornbread. Bagels and croissants. Crackers that contain trans fat.  Vegetables  Creamed or fried vegetables. Vegetables in a . Regular canned vegetables. Regular canned tomato sauce and paste. Regular tomato and vegetable juices.  Fruits Dried fruits. Canned fruit in light or heavy syrup.  Fruit juice.  Meat and Other  Protein Products Meat in general - RED meat & White meat.  Fatty cuts of meat. Ribs, chicken wings, all processed meats as bacon, sausage, bologna, salami, fatback, hot dogs, bratwurst and packaged luncheon meats.  Dairy Whole or 2% milk, cream, half-and-half, and cream cheese. Whole-fat or sweetened yogurt. Full-fat cheeses or blue cheese. Non-dairy creamers and whipped toppings. Processed cheese, cheese spreads, or cheese curds.  Condiments Onion and garlic salt, seasoned salt, table salt, and sea salt. Canned and packaged gravies. Worcestershire sauce. Tartar sauce. Barbecue sauce. Teriyaki sauce. Soy sauce, including reduced sodium. Steak sauce. Fish sauce. Oyster sauce. Cocktail sauce. Horseradish. Ketchup and mustard. Meat flavorings and tenderizers. Bouillon cubes. Hot sauce. Tabasco sauce. Marinades. Taco seasonings. Relishes.  Fats and Oils Butter, stick margarine, lard, shortening and bacon fat. Coconut, palm kernel, or palm oils. Regular salad dressings.  Pickles and olives. Salted popcorn and pretzels.  The items listed above may not be a complete list of foods and beverages to avoid.

## 2018-06-22 NOTE — Patient Instructions (Addendum)
Dementia Dementia is the loss of two or more brain functions, such as:  Memory.  Decision making.  Behavior.  Speaking.  Thinking.  Problem solving. There are many types of dementia. The most common type is called progressive dementia. Progressive dementia gets worse with time and it is irreversible. An example of this type of dementia is Alzheimer disease.  What are the causes?  This condition may be caused by:   Certain medicines.  Multiple small strokes.   What are the signs or symptoms?  Symptoms of this condition include:  Sudden changes in mood.  Depression.  Problems with balance.  Changes in personality.  Poor short-term memory.  Agitation.  Delusions.  Hallucinations.    Having a hard time:   ? Speaking thoughts. ? Finding words. ? Solving problems. ? Doing familiar tasks. ? Understanding familiar ideas. ?  How is this diagnosed?  This condition is diagnosed with an assessment by your health care provider. During this assessment, your health care provider will talk with you and your family, friends, or caregivers about your symptoms. A thorough medical history will be taken, and you will have a physical exam and tests. Tests may include:  Lab tests, such as blood or urine tests.  Imaging tests, such as a CT scan, PET scan, or MRI.  A lumbar puncture. This test involves removing and testing a small amount of the fluid that surrounds the brain and spinal cord.  An electroencephalogram (EEG). In this test, small metal discs are used to measure electrical activity in the brain.  Memory tests, cognitive tests, and neuropsychological tests. These tests evaluate brain function.   How is this treated?  Treatment depends on the cause of the dementia. It may involve taking medicines that may help:  To control the dementia.  To slow down the disease.  To manage symptoms. In some cases, treating the cause of the dementia can improve  symptoms, reverse symptoms, or slow down how quickly the dementia gets worse. Your health care provider can help direct you to support groups, organizations, and other health care providers who can help with decisions about your care.  Follow these instructions at home:  Medicine  Take over-the-counter and prescription medicines only as told by your health care provider.  Avoid taking medicines that can affect thinking, such as pain or sleeping medicines.   Lifestyle   Make healthy lifestyle choices: ? Be physically active as told by your health care provider. ? Do not use any tobacco products, such as cigarettes, chewing tobacco, and e-cigarettes. If you need help quitting, ask your health care provider. ? Eat a healthy diet. ? Practice stress-management techniques when you get stressed. ? Stay social.  Drink enough fluid to keep your urine clear or pale yellow.  Make sure to get quality sleep. These tips can help you to get a good night's rest: ? Avoid napping during the day. ? Keep your sleeping area dark and cool. ? Avoid exercising during the few hours before you go to bed. ? Avoid caffeine products in the evening. ?  General instructions  Work with your health care provider to determine what you need help with and what your safety needs are.  If you were given a bracelet that tracks your location, make sure to wear it.  Keep all follow-up visits as told by your health care provider. This is important.   Contact a health care provider if:  You have any new symptoms.  You have problems with  choking or swallowing.  You have any symptoms of a different illness.   Get help right away if:  You develop a fever.  You have new or worsening confusion.  You have new or worsening sleepiness.  You have a hard time staying awake.  You or your family members become concerned for your  safety. ++++++++++++++++++++++++++++++++++++++++++++++++++++++++++++++++  Confusion  Confusion is the inability to think with the usual speed or clarity. People who are confused often describe their thinking as cloudy or unclear. Confusion can also include feeling disoriented. This means you are unaware of where you are or who you are. You may also not know the date or time. When confused, you may have difficulty remembering, paying attention, or making decisions. Some people also act aggressively when they are confused. In some cases, confusion may come on quickly. In other cases, it may develop slowly over time. How quickly confusion comes on depends on the cause. Confusion may be caused by:  Head injury (concussion).  Seizures.  Stroke.  Fever.  Brain tumor.  Decrease in brain function due to a vascular or neurologic condition (dementia).  Emotions, like rage or terror.  Inability to know what is real and what is not (hallucinations).  Infections, such as a urinary tract infection (UTI).  Using too much alcohol, drugs, or medicines.  Loss of fluid (dehydration) or an imbalance of salts in the body (electrolytes).  Lack of sleep.  Low blood sugar (diabetes).  Low levels of oxygen. This comes from conditions such as chronic lung disorders.  Side effects of medicines, or taking medicines that affect other medicines (drug interactions).  Lack of certain nutrients, especially niacin, thiamine, vitamin C, or vitamin B.  Sudden drop in body temperature (hypothermia).  Change in routine, such as traveling or being hospitalized.   Follow these instructions at home:  Pay attention to your symptoms. Tell your health care provider about any changes or if you develop new symptoms. Follow these instructions to control or treat symptoms. Ask a family member or friend for help if needed.  Medicines   Take over-the-counter and prescription medicines only as told by your health  care provider.  Ask your health care provider about changing or stopping any medicines that may be causing your confusion.  Avoid pain medicines or sleep medicines until you have fully recovered. Use a pillbox or an alarm to help you take the right medicines at the right time.  Lifestyle   Eat a balanced diet that includes fruits and vegetables.  Get enough sleep. For most adults, this is 7-9 hours each night.  Do not drink alcohol.  Do not become isolated. Spend time with other people and make plans for your days.  Do not drive until your health care provider says that it is safe to do so.  Do not use any products that contain nicotine or tobacco, such as cigarettes and e-cigarettes. If you need help quitting, ask your health care provider.  Stop other activities that may increase your chances of getting hurt. These may include some work duties, sports activities, swimming, or bike riding. Ask your health care provider what activities are safe for you.    What caregivers can do    Find out if the person is confused. Ask the person to state his or her name, age, and the date. If the person is unsure or answers incorrectly, he or she may be confused.  Always introduce yourself, no matter how well the person knows you.  Remind the person of his or her location. Do this often.  Place a calendar and clock near the person who is confused.  Talk about current events and plans for the day.  Keep the environment calm, quiet, and peaceful.  Help the person do the things that he or she is unable to do. These include: ? Taking medicines. ? Keeping follow-up visits with his or her health care provider. ? Helping with household duties, including meal preparation. ? Running errands.  Get help if you need it. There are several support groups for caregivers.  If the person you are helping needs more support, consider day care, extended care programs, or a skilled nursing facility.  The person's health care provider may be able to help evaluate these options.   General instructions   Monitor yourself for any conditions you may have. These may include: ? Checking your blood glucose levels, if you have diabetes. ? Watching your weight, if you are overweight. ? Monitoring your blood pressure, if you have hypertension. ? Monitoring your body temperature, if you have a fever.  Keep all follow-up visits as told by your health care provider. This is important.   Contact a health care provider if:   Your symptoms get worse.   Get help right away if you:   Feel that you are not able to care for yourself.  Develop severe headaches, repeated vomiting, seizures, blackouts, or slurred speech.  Have increasing confusion, weakness, numbness, restlessness, or personality changes.  Develop a loss of balance, have marked dizziness, feel uncoordinated, or fall.  Develop severe anxiety, or you have delusions or hallucinations.   These symptoms may represent a serious problem that is an emergency. Do not wait to see if the symptoms will go away. Get medical help right away. Call your local emergency services (911 in the [place] Do not drive yourself to the hospital.  Summary  Confusion is the inability to think with the usual speed or clarity. People who are confused often describe their thinking as cloudy or unclear.  Confusion can also include having difficulty remembering, paying attention, or making decisions.  Confusion may come on quickly or develop slowly over time, depending on the cause. There are many different causes of confusion.  Ask for help from family members or friends if you are unable to take care of yourself.   This information is not intended to replace advice given to you by your health care provider. Make sure you discuss any questions you have with your health care provider. Document Released: 07/04/2004 Document Revised: 05/29/2017 Document  Reviewed: 05/29/2017 [employer] Interactive Patient Education  2019 [employer] ++++++++++++++++++++++++++++++++++++++++++++++++++++++++++++++++ Dementia Caregiver Guide  Dementia is a term used to describe a number of symptoms that affect memory and thinking. The most common symptoms include:  Memory loss.  Trouble with language and communication.  Trouble concentrating.  Poor judgment.  Problems with reasoning.  Child-like behavior and language.  Extreme anxiety.  Angry outbursts.  Wandering from home or public places. Dementia usually gets worse slowly over time. In the early stages, people with dementia can stay independent and safe with some help. In later stages, they need help with daily tasks such as dressing, grooming, and using the bathroom.  How to help the person with dementia cope  Dementia can be frightening and confusing. Here are some tips to help the person with dementia cope with changes caused by the disease.  General tips   Keep the person on track with his  or her routine.  Try to identify areas where the person may need help.  Be supportive, patient, calm, and encouraging.  Gently remind the person that adjusting to changes takes time.  Help with the tasks that the person has asked for help with.  Keep the person involved in daily tasks and decisions as much as possible.  Encourage conversation, but try not to get frustrated or harried if the person struggles to find words or does not seem to appreciate your help.   Communication tips   When the person is talking or seems frustrated, make eye contact and hold the person's hand.  Ask specific questions that need yes or no answers.  Use simple words, short sentences, and a calm voice. Only give one direction at a time.  When offering choices, limit them to just 1 or 2.  Avoid correcting the person in a negative way.  If the person is struggling to find the right words, gently try to  help him or her.   How to recognize symptoms of stress  Symptoms of stress in caregivers include:  Feeling frustrated or angry with the person with dementia.  Denying that the person has dementia or that his or her symptoms will not improve.  Feeling hopeless and unappreciated.  Difficulty sleeping.  Difficulty concentrating.  Feeling anxious, irritable, or depressed.  Developing stress-related health problems.  Feeling like you have too little time for your own life.   Follow these instructions at home:   Make sure that you and the person you are caring for: ? Get regular sleep. ? Exercise regularly. ? Eat regular, nutritious meals. ? Drink enough fluid to keep your urine clear or pale yellow. ? Take over-the-counter and prescription medicines only as told by your health care providers. ? Attend all scheduled health care appointments.  Join a support group with others who are caregivers.  Ask about respite care resources so that you can have a regular break from the stress of caregiving.  Look for signs of stress in yourself and in the person you are caring for. If you notice signs of stress, take steps to manage it.  Consider any safety risks and take steps to avoid them.  Organize medications in a pill box for each day of the week.  Create a plan to handle any legal or financial matters. Get legal or financial advice if needed.  Keep a calendar in a central location to remind the person of appointments or other activities.   Tips for reducing the risk of injury   Keep floors clear of clutter. Remove rugs, magazine racks, and floor lamps.  Keep hallways well lit, especially at night.  Put a handrail and nonslip mat in the bathtub or shower.  Put childproof locks on cabinets that contain dangerous items, such as medicines, alcohol, guns, toxic cleaning items, sharp tools or utensils, matches, and lighters.  Put the locks in places where the person cannot  see or reach them easily. This will help ensure that the person does not wander out of the house and get lost.  Be prepared for emergencies. Keep a list of emergency phone numbers and addresses in a convenient area.  Remove car keys and lock garage doors so that the person does not try to get in the car and drive.  Have the person wear a bracelet that tracks locations and identifies the person as having memory problems. This should be worn at all times for safety.   Where to  find support:  Many individuals and organizations offer support. These include:  Support groups for people with dementia and for caregivers.  Counselors or therapists.  Home health care services.  Adult day care centers. Where to find more information  Alzheimer[employer]: [employer]  Contact a health care provider if:   The person's health is rapidly getting worse.  You are no longer able to care for the person.  Caring for the person is affecting your physical and emotional health.  The person threatens himself or herself, you, or anyone else.   Summary   Dementia is a term used to describe a number of symptoms that affect memory and thinking.  Dementia usually gets worse slowly over time.  Take steps to reduce the person's risk of injury, and to plan for future care.  Caregivers need support, relief from caregiving, and time for their own lives. ++++++++++++++++++++++++++++++++++++++++++++++++++++++++++++++  Recommend Adult Low Dose Aspirin or  coated  Aspirin 81 mg daily  To reduce risk of Colon Cancer 20 %,  Skin Cancer 26 % ,  Melanoma 46%  and  Pancreatic cancer 60% +++++++++++++++++++++++++  Vitamin D goal  is between 70-100.  Please make sure that you are taking your Vitamin D as directed.  It is very important as a natural anti-inflammatory  helping hair, skin, and nails, as well as reducing stroke and heart attack risk.  It helps your bones and helps with mood. It  also decreases numerous cancer risks so please take it as directed.  Low Vit D is associated with a 200-300% higher risk for CANCER  and 200-300% higher risk for HEART   ATTACK  &  STROKE.   .....................................Marland Kitchen It is also associated with higher death rate at younger ages,  autoimmune diseases like Rheumatoid arthritis, Lupus, Multiple Sclerosis.    Also many other serious conditions, like depression, Alzheimer's Dementia, infertility, muscle aches, fatigue, fibromyalgia - just to name a few.  ++++++++++++++++++++  Recommend the book "The END of DIETING" by Dr Excell Seltzer  & the book "The END of DIABETES " by Dr Excell Seltzer At [email or web address] - get book & Audio CD's    Being diabetic has a  300% increased risk for heart attack, stroke, cancer, and alzheimer- type vascular dementia. It is very important that you work harder with diet by avoiding all foods that are white. Avoid white rice (brown & wild rice is OK), white potatoes (sweetpotatoes in moderation is OK), White bread or wheat bread or anything made out of white flour like bagels, donuts, rolls, buns, biscuits, cakes, pastries, cookies, pizza crust, and pasta (made from white flour & egg whites) - vegetarian pasta or spinach or wheat pasta is OK. Multigrain breads like Arnold's or Pepperidge [employer], or multigrain sandwich thins or flatbreads.  Diet, exercise and weight loss can reverse and cure diabetes in the early stages.  Diet, exercise and weight loss is very important in the control and prevention of complications of diabetes which affects every system in your body, ie. Brain - dementia/stroke, eyes - glaucoma/blindness, heart - heart attack/heart failure, kidneys - dialysis, stomach - gastric paralysis, intestines - malabsorption, nerves - severe painful neuritis, circulation - gangrene & loss of a leg(s), and finally cancer and Alzheimers.    I recommend avoid fried & greasy foods,  sweets/candy, white rice (brown or wild  rice or Quinoa is OK), white potatoes (sweet potatoes are OK) - anything made from white flour - bagels, doughnuts, rolls, buns, biscuits,white and wheat breads,  pizza crust and traditional pasta made of white flour & egg white(vegetarian pasta or spinach or wheat pasta is OK).  Multi-grain bread is OK - like multi-grain flat bread or sandwich thins. Avoid alcohol in excess. Exercise is also important.    Eat all the vegetables you want - avoid meat, especially red meat and dairy - especially cheese.  Cheese is the most concentrated form of trans-fats which is the worst thing to clog up our arteries. Veggie cheese is OK which can be found in the fresh produce section at [employer] or [employer] or [employer]  +++++++++++++++++++++ DASH Eating Plan  DASH stands for "Dietary Approaches to Stop Hypertension."   The DASH eating plan is a healthy eating plan that has been shown to reduce high blood pressure (hypertension). Additional health benefits may include reducing the risk of type 2 diabetes mellitus, heart disease, and stroke. The DASH eating plan may also help with weight loss. WHAT DO I NEED TO KNOW ABOUT THE DASH EATING PLAN? For the DASH eating plan, you will follow these general guidelines:  Choose foods with a percent daily value for sodium of less than 5% (as listed on the food label).  Use salt-free seasonings or herbs instead of table salt or sea salt.  Check with your health care provider or pharmacist before using salt substitutes.  Eat lower-sodium products, often labeled as "lower sodium" or "no salt added."  Eat fresh foods.  Eat more vegetables, fruits, and low-fat dairy products.  Choose whole grains. Look for the word "whole" as the first word in the ingredient list.  Choose fish   Limit sweets, desserts, sugars, and sugary drinks.  Choose heart-healthy fats.  Eat veggie cheese   Eat more home-cooked food and less restaurant, buffet, and fast food.  Limit  fried foods.  Cook foods using methods other than frying.  Limit canned vegetables. If you do use them, rinse them well to decrease the sodium.  When eating at a restaurant, ask that your food be prepared with less salt, or no salt if possible.                      WHAT FOODS CAN I EAT? Read Dr Fara Olden Fuhrman's books on The End of Dieting & The End of Diabetes  Grains Whole grain or whole wheat bread. Brown rice. Whole grain or whole wheat pasta. Quinoa, bulgur, and whole grain cereals. Low-sodium cereals. Corn or whole wheat flour tortillas. Whole grain cornbread. Whole grain crackers. Low-sodium crackers.  Vegetables Fresh or frozen vegetables (raw, steamed, roasted, or grilled). Low-sodium or reduced-sodium tomato and vegetable juices. Low-sodium or reduced-sodium tomato sauce and paste. Low-sodium or reduced-sodium canned vegetables.   Fruits All fresh, canned (in natural juice), or frozen fruits.  Protein Products  All fish and seafood.  Dried beans, peas, or lentils. Unsalted nuts and seeds. Unsalted canned beans.  Dairy Low-fat dairy products, such as skim or 1% milk, 2% or reduced-fat cheeses, low-fat ricotta or cottage cheese, or plain low-fat yogurt. Low-sodium or reduced-sodium cheeses.  Fats and Oils Tub margarines without trans fats. Light or reduced-fat mayonnaise and salad dressings (reduced sodium). Avocado. Safflower, olive, or canola oils. Natural peanut or almond butter.  Other Unsalted popcorn and pretzels. The items listed above may not be a complete list of recommended foods or beverages. Contact your dietitian for more options.  +++++++++++++++  WHAT FOODS ARE NOT RECOMMENDED? Grains/ White flour or wheat flour White bread. White pasta. White  rice. Refined cornbread. Bagels and croissants. Crackers that contain trans fat.  Vegetables  Creamed or fried vegetables. Vegetables in a . Regular canned vegetables. Regular canned tomato sauce and paste. Regular  tomato and vegetable juices.  Fruits Dried fruits. Canned fruit in light or heavy syrup. Fruit juice.  Meat and Other Protein Products Meat in general - RED meat & White meat.  Fatty cuts of meat. Ribs, chicken wings, all processed meats as bacon, sausage, bologna, salami, fatback, hot dogs, bratwurst and packaged luncheon meats.  Dairy Whole or 2% milk, cream, half-and-half, and cream cheese. Whole-fat or sweetened yogurt. Full-fat cheeses or blue cheese. Non-dairy creamers and whipped toppings. Processed cheese, cheese spreads, or cheese curds.  Condiments Onion and garlic salt, seasoned salt, table salt, and sea salt. Canned and packaged gravies. Worcestershire sauce. Tartar sauce. Barbecue sauce. Teriyaki sauce. Soy sauce, including reduced sodium. Steak sauce. Fish sauce. Oyster sauce. Cocktail sauce. Horseradish. Ketchup and mustard. Meat flavorings and tenderizers. Bouillon cubes. Hot sauce. Tabasco sauce. Marinades. Taco seasonings. Relishes.  Fats and Oils Butter, stick margarine, lard, shortening and bacon fat. Coconut, palm kernel, or palm oils. Regular salad dressings.  Pickles and olives. Salted popcorn and pretzels.  The items listed above may not be a complete list of foods and beverages to avoid.

## 2018-06-22 NOTE — Progress Notes (Signed)
This very nice 73 y.o. MBF presents for 6 month follow up with HTN, HLD, remote Breast Cancer (2011), Pre-Diabetes and Vitamin D Deficiency. Patient has hx/o Hyperparathyroidism predating to 2001 with excision of a Rt parathyroid adenoma.  Patient is brought in by her brother today & concern for worsening memory. In Apr 2019, patient was evaluated by Dr [name] who ordered a Brain MRI, sleep studies & Neuropsych testing. MRI showed small vessel vascular changes. She has not yet had the Sleep of Neuropsych testing.                                              Patient is treated for HTN (1990) & BP has been controlled at home. Today's BP is at goal 120/68. Patient has had no complaints of any cardiac type chest pain, palpitations, dyspnea / orthopnea / PND, dizziness, claudication, or dependent edema.                                         Hyperlipidemia is controlled with diet & meds. Patient denies myalgias or other med SE's. Last Lipids were at goal:  Lab Results  Component Value Date   CHOL 165 03/03/2018   HDL 74 03/03/2018   LDLCALC 76 03/03/2018   TRIG 73 03/03/2018   CHOLHDL 2.2 03/03/2018                                            Also, the patient has history of PreDiabetes (A1c 6.0% / 2011) and has had no symptoms of reactive hypoglycemia, diabetic polys, paresthesias or visual blurring.   With improved diet & weight loss her last A1c was almost to goal:  RecentLabs  Component Value Date   HGBA1C 5.7 (H) 03/03/2018                                            Further, the patient also has history of Vitamin D Deficiency("13" / 2008)  and supplements vitamin D without any suspected side-effects. Last vitamin D was still low:  Component Value Date   VD25OH 51 11/18/2016   Patient is very unsure of what meds she's taking as apparently her husband usually manages.  Current Outpatient Medications on File Prior to Visit  Medication Sig  . aspirin 81 MG tablet Take  81 mg by mouth daily.   Marland Kitchen atenolol (TENORMIN) 100 MG tablet Take 1 tablet daily for BP  . Cholecalciferol (VITAMIN D PO) Take 2,000 Units by mouth daily.   . Dorzolamide HCl-Timolol Mal PF 22.3-6.8 MG/ML SOLN Apply 1 drop to eye 2 (two) times daily. Left eye  . fluticasone (FLONASE) 50 MCG/ACT nasal spray INSTILL 1 SPRAY INTO EACH NOSTRIL TWICE A DAY  . latanoprost (XALATAN) 0.005 % ophthalmic solution Place 1 drop into both eyes at bedtime.  Marland Kitchen letrozole (FEMARA) 2.5 MG tablet letrozole 2.5 mg tablet  . olmesartan (BENICAR) 40 MG tablet 1/2 pill daily for BP  . OVER THE COUNTER MEDICATION daily. B-Complex with 1000 mcg Biotin  .  rosuvastatin (CRESTOR) 20 MG tablet TAKE 1 TABLET BY MOUTH EVERYDAY AT BEDTIME   No current facility-administered medications on file prior to visit.    Allergies  Allergen Reactions  . Minocycline   . Prednisone Swelling  . Sulfa Antibiotics     Patient does not remember the type of reaction, happened years ago.  . Zithromax [Azithromycin] Swelling  . Ciprofloxacin Hcl Rash  . Penicillins Rash   PMHx:   Past Medical History:  Diagnosis Date  . Breast cancer ([place])   . Chest tightness   . GERD (gastroesophageal reflux disease)   . Hyperlipidemia   . Hypertension   . Palpitations   . Prediabetes   . Vitamin D deficiency    Immunization History  Administered Date(s) Administered  . Influenza, High Dose Seasonal PF 03/07/2014, 05/08/2015, 03/11/2017, 03/03/2018  . PPD Test 08/30/2013, 10/04/2014, 10/24/2015  . Pneumococcal Conjugate-13 03/24/2014  . Pneumococcal Polysaccharide-23 09/08/2006, 03/11/2017  . Tdap 08/27/2012  . Zoster 09/03/2012   Past Surgical History:  Procedure Laterality Date  . MASTECTOMY  August 2011   Bilateral  . NASAL SINUS SURGERY    . PARATHYROIDECTOMY     FHx:    Reviewed / unchanged  SHx:    Reviewed / unchanged   Systems Review:  Constitutional: Denies fever, chills, wt changes, headaches, insomnia, fatigue,  night sweats, change in appetite. Eyes: Denies redness, blurred vision, diplopia, discharge, itchy, watery eyes.  ENT: Denies discharge, congestion, post nasal drip, epistaxis, sore throat, earache, hearing loss, dental pain, tinnitus, vertigo, sinus pain, snoring.  CV: Denies chest pain, palpitations, irregular heartbeat, syncope, dyspnea, diaphoresis, orthopnea, PND, claudication or edema. Respiratory: denies cough, dyspnea, DOE, pleurisy, hoarseness, laryngitis, wheezing.  Gastrointestinal: Denies dysphagia, odynophagia, heartburn, reflux, water brash, abdominal pain or cramps, nausea, vomiting, bloating, diarrhea, constipation, hematemesis, melena, hematochezia  or hemorrhoids. Genitourinary: Denies dysuria, frequency, urgency, nocturia, hesitancy, discharge, hematuria or flank pain. Musculoskeletal: Denies arthralgias, myalgias, stiffness, jt. swelling, pain, limping or strain/sprain.  Skin: Denies pruritus, rash, hives, warts, acne, eczema or change in skin lesion(s). Neuro: No weakness, tremor, incoordination, spasms, paresthesia or pain. Psychiatric: Admits poor memory esp short term.. Endo: Denies change in weight, skin or hair change.  Heme/Lymph: No excessive bleeding, bruising or enlarged lymph nodes.  Physical Exam  BP 120/68   Pulse 64   Temp 97.7 F (36.5 C)   Resp 16   Ht 5\' 6"  (1.676 m)   Wt 115 lb (52.2 kg)   BMI 18.56 kg/m   Appears  well nourished, well groomed  and in no distress.  Eyes: PERRLA, EOMs, conjunctiva no swelling or erythema. Sinuses: No frontal/maxillary tenderness ENT/Mouth: EAC's clear, TM's nl w/o erythema, bulging. Nares clear w/o erythema, swelling, exudates. Oropharynx clear without erythema or exudates. Oral hygiene is good. Tongue normal, non obstructing. Hearing intact.  Neck: Supple. Thyroid not palpable. Car 2+/2+ without bruits, nodes or JVD. Chest: Respirations nl with BS clear & equal w/o rales, rhonchi, wheezing or stridor.  Cor: Heart  sounds normal w/ regular rate and rhythm without sig. murmurs, gallops, clicks or rubs. Peripheral pulses normal and equal  without edema.  Abdomen: Soft & bowel sounds normal. Non-tender w/o guarding, rebound, hernias, masses or organomegaly.  Lymphatics: Unremarkable.  Musculoskeletal: Full ROM all peripheral extremities, joint stability, 5/5 strength and normal gait.  Skin: Warm, dry without exposed rashes, lesions or ecchymosis apparent.  Neuro: Cranial nerves intact, reflexes equal bilaterally. Sensory-motor testing grossly intact. Tendon reflexes grossly intact.  Pysch: Alert & oriented  x 2 not to date, time or season.  Insight and judgement very limited.  Assessment and Plan:  1. Essential hypertension  - Continue medication, monitor blood pressure at home.  - Continue DASH diet.  Reminder to go to the ER if any CP,  SOB, nausea, dizziness, severe HA, changes vision/speech.  - CBC with Differential/Platelet - COMPLETE METABOLIC PANEL WITH GFR - Magnesium - TSH  2. Hyperlipidemia, mixed  - Continue diet/meds, exercise,& lifestyle modifications.  - Continue monitor periodic cholesterol/liver & renal functions   - Lipid panel - TSH  3. Abnormal glucose  - Continue diet, exercise  - Lifestyle modifications.  - Monitor appropriate labs.  - Hemoglobin A1c - Insulin, random  4. Vitamin D deficiency  - Continue supplementation.   - VITAMIN D 25 Hydroxyl  5. Prediabetes  - Hemoglobin A1c - Insulin, random  6. Vascular dementia without behavioral disturbance ([place])   7. Vitamin B12 deficiency  - CBC with Differential/Platelet - Vitamin B12 - Methylmalonic acid, serum  8. Medication management  - CBC with Differential/Platelet - COMPLETE METABOLIC PANEL WITH GFR - Magnesium - Lipid panel - TSH - Hemoglobin A1c - Insulin, random - VITAMIN D 25 Hydroxyl - Vitamin B12 - Methylmalonic acid, serum        Discussed  regular exercise, BP monitoring, weight  control to achieve/maintain BMI less than 25 and discussed med and SE's. Recommended labs to assess and monitor clinical status with further disposition pending results of labs. Over 30 minutes of exam, counseling, chart review was performed.

## 2018-06-22 NOTE — Progress Notes (Signed)
C  A  N  C  E  L  L  E D at  A  P  P  ' T        T  I  M  E                                                                                                                                                                                                                                                                                                                                 This very nice 73 y.o. MBF presents for 6 month follow up with HTN, HLD, remote Breast Cancer (2011), Pre-Diabetes and Vitamin D Deficiency. P{atient has hx/o Hyperparathyroidism predating to 2001 with excision of a Rt parathyroid adenoma.  Patient is brought in by husband concerned for worsening memory. In Apr 2019, patient was evaluated by Dr [name] who ordered a Brain MRI, sleep studies & Neuropsychological testing. MRI showed small vessel vascular changes.        Patient is treated for HTN (1990) & BP has been controlled at home. Today's  . Patient has had no complaints of any cardiac type chest pain, palpitations, dyspnea / orthopnea / PND, dizziness, claudication, or dependent edema.     Hyperlipidemia is controlled with diet & meds. Patient denies myalgias or other med SE's. Last Lipids were at goal: Lab Results  Component Value Date   CHOL 165 03/03/2018   HDL 74 03/03/2018   LDLCALC 76 03/03/2018   TRIG 73 03/03/2018   CHOLHDL 2.2 03/03/2018      Also, the patient has history of PreDiabetes  (A1c 6.0% / 2011)  and has had no symptoms of reactive hypoglycemia, diabetic polys, paresthesias or visual blurring.   With improved diet & weight loss her last A1c was almost to goal: Lab Results  Component  Value Date   HGBA1C 5.7 (H) 03/03/2018  Further, the patient also has history of Vitamin D Deficiency("13" / 2008)  and supplements vitamin D without any suspected side-effects. Last vitamin D was still low: Lab Results  Component Value Date   VD25OH 38 11/18/2016   Current Outpatient Medications on File Prior to Visit  Medication Sig  . aspirin 81 MG tablet Take 81 mg by mouth daily.   Marland Kitchen atenolol (TENORMIN) 100 MG tablet Take 1 tablet daily for BP  . Cholecalciferol (VITAMIN D PO) Take 2,000 Units by mouth daily.   . Dorzolamide HCl-Timolol Mal PF 22.3-6.8 MG/ML SOLN Apply 1 drop to eye 2 (two) times daily. Left eye  . fluticasone (FLONASE) 50 MCG/ACT nasal spray INSTILL 1 SPRAY INTO EACH NOSTRIL TWICE A DAY  . latanoprost (XALATAN) 0.005 % ophthalmic solution Place 1 drop into both eyes at bedtime.  Marland Kitchen letrozole (FEMARA) 2.5 MG tablet letrozole 2.5 mg tablet  . olmesartan (BENICAR) 40 MG tablet 1/2 pill daily for BP  . OVER THE COUNTER MEDICATION daily. B-Complex with 1000 mcg Biotin  . rosuvastatin (CRESTOR) 20 MG tablet TAKE 1 TABLET BY MOUTH EVERYDAY AT BEDTIME   No current facility-administered medications on file prior to visit.    Allergies  Allergen Reactions  . Minocycline   . Prednisone Swelling  . Sulfa Antibiotics     Patient does not remember the type of reaction, happened years ago.  . Zithromax [Azithromycin] Swelling  . Ciprofloxacin Hcl Rash  . Penicillins Rash   PMHx:   Past Medical History:  Diagnosis Date  . Breast cancer ([place])   . Chest tightness   . GERD (gastroesophageal reflux disease)   . Hyperlipidemia   . Hypertension   . Palpitations   . Prediabetes   . Vitamin D deficiency    Immunization History  Administered Date(s) Administered  . Influenza, High Dose Seasonal PF 03/07/2014, 05/08/2015, 03/11/2017, 03/03/2018  . PPD Test 08/30/2013, 10/04/2014, 10/24/2015  . Pneumococcal Conjugate-13 03/24/2014  . Pneumococcal Polysaccharide-23  09/08/2006, 03/11/2017  . Tdap 08/27/2012  . Zoster 09/03/2012   Past Surgical History:  Procedure Laterality Date  . MASTECTOMY  August 2011   Bilateral  . NASAL SINUS SURGERY    . PARATHYROIDECTOMY     FHx:    Reviewed / unchanged  SHx:    Reviewed / unchanged   Systems Review:  Constitutional: Denies fever, chills, wt changes, headaches, insomnia, fatigue, night sweats, change in appetite. Eyes: Denies redness, blurred vision, diplopia, discharge, itchy, watery eyes.  ENT: Denies discharge, congestion, post nasal drip, epistaxis, sore throat, earache, hearing loss, dental pain, tinnitus, vertigo, sinus pain, snoring.  CV: Denies chest pain, palpitations, irregular heartbeat, syncope, dyspnea, diaphoresis, orthopnea, PND, claudication or edema. Respiratory: denies cough, dyspnea, DOE, pleurisy, hoarseness, laryngitis, wheezing.  Gastrointestinal: Denies dysphagia, odynophagia, heartburn, reflux, water brash, abdominal pain or cramps, nausea, vomiting, bloating, diarrhea, constipation, hematemesis, melena, hematochezia  or hemorrhoids. Genitourinary: Denies dysuria, frequency, urgency, nocturia, hesitancy, discharge, hematuria or flank pain. Musculoskeletal: Denies arthralgias, myalgias, stiffness, jt. swelling, pain, limping or strain/sprain.  Skin: Denies pruritus, rash, hives, warts, acne, eczema or change in skin lesion(s). Neuro: No weakness, tremor, incoordination, spasms, paresthesia or pain. Psychiatric: Denies confusion, memory loss or sensory loss. Endo: Denies change in weight, skin or hair change.  Heme/Lymph: No excessive bleeding, bruising or enlarged lymph nodes.  Physical Exam  There were no vitals taken for this visit.  Appears  well nourished, well groomed  and in no distress.  Eyes: PERRLA, EOMs, conjunctiva no swelling or  erythema. Sinuses: No frontal/maxillary tenderness ENT/Mouth: EAC's clear, TM's nl w/o erythema, bulging. Nares clear w/o erythema,  swelling, exudates. Oropharynx clear without erythema or exudates. Oral hygiene is good. Tongue normal, non obstructing. Hearing intact.  Neck: Supple. Thyroid not palpable. Car 2+/2+ without bruits, nodes or JVD. Chest: Respirations nl with BS clear & equal w/o rales, rhonchi, wheezing or stridor.  Cor: Heart sounds normal w/ regular rate and rhythm without sig. murmurs, gallops, clicks or rubs. Peripheral pulses normal and equal  without edema.  Abdomen: Soft & bowel sounds normal. Non-tender w/o guarding, rebound, hernias, masses or organomegaly.  Lymphatics: Unremarkable.  Musculoskeletal: Full ROM all peripheral extremities, joint stability, 5/5 strength and normal gait.  Skin: Warm, dry without exposed rashes, lesions or ecchymosis apparent.  Neuro: Cranial nerves intact, reflexes equal bilaterally. Sensory-motor testing grossly intact. Tendon reflexes grossly intact.  Pysch: Alert & oriented x 3.  Insight and judgement nl & appropriate. No ideations.  Assessment and Plan:  - Continue medication, monitor blood pressure at home.  - Continue DASH diet.  Reminder to go to the ER if any CP,  SOB, nausea, dizziness, severe HA, changes vision/speech.  - Continue diet/meds, exercise,& lifestyle modifications.  - Continue monitor periodic cholesterol/liver & renal functions   - Continue diet, exercise  - Lifestyle modifications.  - Monitor appropriate labs. - Continue supplementation.      Discussed  regular exercise, BP monitoring, weight control to achieve/maintain BMI less than 25 and discussed med and SE's. Recommended labs to assess and monitor clinical status with further disposition pending results of labs. Over 30 minutes of exam, counseling, chart review was performed.

## 2018-06-24 ENCOUNTER — Ambulatory Visit (INDEPENDENT_AMBULATORY_CARE_PROVIDER_SITE_OTHER): Payer: Medicare HMO | Admitting: Internal Medicine

## 2018-06-24 ENCOUNTER — Encounter: Payer: Self-pay | Admitting: Internal Medicine

## 2018-06-24 VITALS — BP 120/68 | HR 64 | Temp 97.7°F | Resp 16 | Ht 66.0 in | Wt 115.0 lb

## 2018-06-24 DIAGNOSIS — R69 Illness, unspecified: Secondary | ICD-10-CM | POA: Diagnosis not present

## 2018-06-24 DIAGNOSIS — E538 Deficiency of other specified B group vitamins: Secondary | ICD-10-CM

## 2018-06-24 DIAGNOSIS — I10 Essential (primary) hypertension: Secondary | ICD-10-CM

## 2018-06-24 DIAGNOSIS — F0150 Vascular dementia without behavioral disturbance: Secondary | ICD-10-CM

## 2018-06-24 DIAGNOSIS — E782 Mixed hyperlipidemia: Secondary | ICD-10-CM | POA: Diagnosis not present

## 2018-06-24 DIAGNOSIS — Z79899 Other long term (current) drug therapy: Secondary | ICD-10-CM | POA: Diagnosis not present

## 2018-06-24 DIAGNOSIS — R7303 Prediabetes: Secondary | ICD-10-CM | POA: Diagnosis not present

## 2018-06-24 DIAGNOSIS — R7309 Other abnormal glucose: Secondary | ICD-10-CM

## 2018-06-24 DIAGNOSIS — E559 Vitamin D deficiency, unspecified: Secondary | ICD-10-CM

## 2018-06-27 LAB — COMPLETE METABOLIC PANEL WITH GFR
AG Ratio: 2.1 (calc) (ref 1.0–2.5)
ALT: 13 U/L (ref 6–29)
AST: 17 U/L (ref 10–35)
Albumin: 4.4 g/dL (ref 3.6–5.1)
Alkaline phosphatase (APISO): 89 U/L (ref 33–130)
BUN: 14 mg/dL (ref 7–25)
CO2: 27 mmol/L (ref 20–32)
Calcium: 10.2 mg/dL (ref 8.6–10.4)
Chloride: 107 mmol/L (ref 98–110)
Creat: 0.86 mg/dL (ref 0.60–0.93)
GFR, Est African American: 79 mL/min/{1.73_m2} (ref >=60)
GFR, Est Non African American: 68 mL/min/{1.73_m2} (ref >=60)
GLUCOSE: 111 mg/dL — AB (ref 65–99)
Globulin: 2.1 g/dL (calc) (ref 1.9–3.7)
Potassium: 3.6 mmol/L (ref 3.5–5.3)
Sodium: 140 mmol/L (ref 135–146)
Total Bilirubin: 1.5 mg/dL — ABNORMAL HIGH (ref 0.2–1.2)
Total Protein: 6.5 g/dL (ref 6.1–8.1)

## 2018-06-27 LAB — CBC WITH DIFFERENTIAL/PLATELET
Absolute Monocytes: 228 cells/uL (ref 200–950)
Basophils Absolute: 9 cells/uL (ref 0–200)
Basophils Relative: 0.3 %
Eosinophils Absolute: 21 cells/uL (ref 15–500)
Eosinophils Relative: 0.7 %
HCT: 42 % (ref 35.0–45.0)
Hemoglobin: 14.1 g/dL (ref 11.7–15.5)
Lymphs Abs: 966 cells/uL (ref 850–3900)
MCH: 31.3 pg (ref 27.0–33.0)
MCHC: 33.6 g/dL (ref 32.0–36.0)
MCV: 93.1 fL (ref 80.0–100.0)
MPV: 9.8 fL (ref 7.5–12.5)
Monocytes Relative: 7.6 %
Neutro Abs: 1776 cells/uL (ref 1500–7800)
Neutrophils Relative %: 59.2 %
Platelets: 171 10*3/uL (ref 140–400)
RBC: 4.51 10*6/uL (ref 3.80–5.10)
RDW: 11.7 % (ref 11.0–15.0)
Total Lymphocyte: 32.2 %
WBC: 3 10*3/uL — ABNORMAL LOW (ref 3.8–10.8)

## 2018-06-27 LAB — METHYLMALONIC ACID, SERUM: Methylmalonic Acid, Quant: 128 nmol/L (ref 87–318)

## 2018-06-27 LAB — MAGNESIUM: Magnesium: 1.9 mg/dL (ref 1.5–2.5)

## 2018-06-27 LAB — INSULIN, RANDOM: Insulin: 40.3 u[IU]/mL — ABNORMAL HIGH (ref 2.0–19.6)

## 2018-06-27 LAB — LIPID PANEL
Cholesterol: 138 mg/dL (ref <200)
HDL: 64 mg/dL (ref >50)
LDL Cholesterol (Calc): 61 mg/dL (calc)
Non-HDL Cholesterol (Calc): 74 mg/dL (calc) (ref <130)
Total CHOL/HDL Ratio: 2.2 (calc) (ref <5.0)
Triglycerides: 57 mg/dL (ref <150)

## 2018-06-27 LAB — TSH: TSH: 0.56 mIU/L (ref 0.40–4.50)

## 2018-06-27 LAB — HEMOGLOBIN A1C
Hgb A1c MFr Bld: 5.6 % of total Hgb (ref <5.7)
Mean Plasma Glucose: 114 (calc)
eAG (mmol/L): 6.3 (calc)

## 2018-06-27 LAB — VITAMIN D 25 HYDROXY (VIT D DEFICIENCY, FRACTURES): Vit D, 25-Hydroxy: 34 ng/mL (ref 30–100)

## 2018-06-27 LAB — VITAMIN B12: Vitamin B-12: 617 pg/mL (ref 200–1100)

## 2018-06-28 ENCOUNTER — Encounter: Payer: Self-pay | Admitting: Internal Medicine

## 2018-06-30 DIAGNOSIS — H401131 Primary open-angle glaucoma, bilateral, mild stage: Secondary | ICD-10-CM | POA: Diagnosis not present

## 2018-08-10 ENCOUNTER — Ambulatory Visit: Payer: Medicare HMO | Admitting: Neurology

## 2018-08-10 ENCOUNTER — Encounter: Payer: Self-pay | Admitting: Neurology

## 2018-08-10 ENCOUNTER — Ambulatory Visit: Payer: Self-pay | Admitting: Internal Medicine

## 2018-08-10 ENCOUNTER — Telehealth: Payer: Self-pay | Admitting: *Deleted

## 2018-08-10 VITALS — BP 133/79 | HR 85 | Ht 67.0 in | Wt 116.0 lb

## 2018-08-10 DIAGNOSIS — R69 Illness, unspecified: Secondary | ICD-10-CM | POA: Diagnosis not present

## 2018-08-10 DIAGNOSIS — G301 Alzheimer's disease with late onset: Secondary | ICD-10-CM | POA: Diagnosis not present

## 2018-08-10 DIAGNOSIS — F0280 Dementia in other diseases classified elsewhere without behavioral disturbance: Secondary | ICD-10-CM | POA: Diagnosis not present

## 2018-08-10 MED ORDER — DONEPEZIL HCL 10 MG PO TABS: 10.0000 mg | ORAL_TABLET | Freq: Every day | ORAL | 6 refills | Status: DC

## 2018-08-10 NOTE — Patient Instructions (Addendum)
Formal memory testing: Dr. Chauncy Passy will call Start Aricept (Donepezil) for memory: Start with 1/2 pill and if no side effects increase to a whole pill in 2 weeks In a month can call and we can start Namenda (memantine) Sleep evaluation - we will call   Dementia Dementia is the loss of two or more brain functions, such as:  Memory.  Decision making.  Behavior.  Speaking.  Thinking.  Problem solving. There are many types of dementia. The most common type is called progressive dementia. Progressive dementia gets worse with time and it is irreversible. An example of this type of dementia is Alzheimer disease. What are the causes? This condition may be caused by:  Nerve cell damage in the brain.  Genetic mutations.  Certain medicines.  Multiple small strokes.  An infection, such as chronic meningitis.  A metabolic problem, such as vitamin B12 deficiency or thyroid disease.  Pressure on the brain, such as from a tumor or blood clot. What are the signs or symptoms? Symptoms of this condition include:  Sudden changes in mood.  Depression.  Problems with balance.  Changes in personality.  Poor short-term memory.  Agitation.  Delusions.  Hallucinations.  Having a hard time: ? Speaking thoughts. ? Finding words. ? Solving problems. ? Doing familiar tasks. ? Understanding familiar ideas. How is this diagnosed? This condition is diagnosed with an assessment by your health care provider. During this assessment, your health care provider will talk with you and your family, friends, or caregivers about your symptoms. A thorough medical history will be taken, and you will have a physical exam and tests. Tests may include:  Lab tests, such as blood or urine tests.  Imaging tests, such as a CT scan, PET scan, or MRI.  A lumbar puncture. This test involves removing and testing a small amount of the fluid that surrounds the brain and spinal cord.  An  electroencephalogram (EEG). In this test, small metal discs are used to measure electrical activity in the brain.  Memory tests, cognitive tests, and neuropsychological tests. These tests evaluate brain function. How is this treated? Treatment depends on the cause of the dementia. It may involve taking medicines that may help:  To control the dementia.  To slow down the disease.  To manage symptoms. In some cases, treating the cause of the dementia can improve symptoms, reverse symptoms, or slow down how quickly the dementia gets worse. Your health care provider can help direct you to support groups, organizations, and other health care providers who can help with decisions about your care. Follow these instructions at home: Medicine  Take over-the-counter and prescription medicines only as told by your health care provider.  Avoid taking medicines that can affect thinking, such as pain or sleeping medicines. Lifestyle   Make healthy lifestyle choices: ? Be physically active as told by your health care provider. ? Do not use any tobacco products, such as cigarettes, chewing tobacco, and e-cigarettes. If you need help quitting, ask your health care provider. ? Eat a healthy diet. ? Practice stress-management techniques when you get stressed. ? Stay social.  Drink enough fluid to keep your urine clear or pale yellow.  Make sure to get quality sleep. These tips can help you to get a good night's rest: ? Avoid napping during the day. ? Keep your sleeping area dark and cool. ? Avoid exercising during the few hours before you go to bed. ? Avoid caffeine products in the evening. General instructions  Work  with your health care provider to determine what you need help with and what your safety needs are.  If you were given a bracelet that tracks your location, make sure to wear it.  Keep all follow-up visits as told by your health care provider. This is important. Contact a health  care provider if:  You have any new symptoms.  You have problems with choking or swallowing.  You have any symptoms of a different illness. Get help right away if:  You develop a fever.  You have new or worsening confusion.  You have new or worsening sleepiness.  You have a hard time staying awake.  You or your family members become concerned for your safety. This information is not intended to replace advice given to you by your health care provider. Make sure you discuss any questions you have with your health care provider. Document Released: 11/20/2000 Document Revised: 10/05/2015 Document Reviewed: 02/22/2015 Elsevier Interactive Patient Education  2019 [employer]     Memantine Tablets What is this medicine? MEMANTINE (MEM an teen) is used to treat dementia caused by Alzheimer's disease. This medicine may be used for other purposes; ask your health care provider or pharmacist if you have questions. COMMON BRAND NAME(S): Namenda What should I tell my health care provider before I take this medicine? They need to know if you have any of these conditions: -difficulty passing urine -kidney disease -liver disease -seizures -an unusual or allergic reaction to memantine, other medicines, foods, dyes, or preservatives -pregnant or trying to get pregnant -breast-feeding How should I use this medicine? Take this medicine by mouth with a glass of water. Follow the directions on the prescription label. You may take this medicine with or without food. Take your doses at regular intervals. Do not take your medicine more often than directed. Continue to take your medicine even if you feel better. Do not stop taking except on the advice of your doctor or health care professional. Talk to your pediatrician regarding the use of this medicine in children. Special care may be needed. Overdosage: If you think you have taken too much of this medicine contact a poison control center or  emergency room at once. NOTE: This medicine is only for you. Do not share this medicine with others. What if I miss a dose? If you miss a dose, take it as soon as you can. If it is almost time for your next dose, take only that dose. Do not take double or extra doses. If you do not take your medicine for several days, contact your health care provider. Your dose may need to be changed. What may interact with this medicine? -acetazolamide -amantadine -cimetidine -dextromethorphan -dofetilide -hydrochlorothiazide -ketamine -metformin -methazolamide -quinidine -ranitidine -sodium bicarbonate -triamterene This list may not describe all possible interactions. Give your health care provider a list of all the medicines, herbs, non-prescription drugs, or dietary supplements you use. Also tell them if you smoke, drink alcohol, or use illegal drugs. Some items may interact with your medicine. What should I watch for while using this medicine? Visit your doctor or health care professional for regular checks on your progress. Check with your doctor or health care professional if there is no improvement in your symptoms or if they get worse. You may get drowsy or dizzy. Do not drive, use machinery, or do anything that needs mental alertness until you know how this drug affects you. Do not stand or sit up quickly, especially if you are an older  patient. This reduces the risk of dizzy or fainting spells. Alcohol can make you more drowsy and dizzy. Avoid alcoholic drinks. What side effects may I notice from receiving this medicine? Side effects that you should report to your doctor or health care professional as soon as possible: -allergic reactions like skin rash, itching or hives, swelling of the face, lips, or tongue -agitation or a feeling of restlessness -depressed mood -dizziness -hallucinations -redness, blistering, peeling or loosening of the skin, including inside the  mouth -seizures -vomiting Side effects that usually do not require medical attention (report to your doctor or health care professional if they continue or are bothersome): -constipation -diarrhea -headache -nausea -trouble sleeping This list may not describe all possible side effects. Call your doctor for medical advice about side effects. You may report side effects to FDA at 1-800-FDA-1088. Where should I keep my medicine? Keep out of the reach of children. Store at room temperature between 15 degrees and 30 degrees C (59 degrees and 86 degrees F). Throw away any unused medicine after the expiration date. NOTE: This sheet is a summary. It may not cover all possible information. If you have questions about this medicine, talk to your doctor, pharmacist, or health care provider.  2019 Elsevier/Gold Standard (2013-03-15 14:10:42)    Donepezil tablets What is this medicine? DONEPEZIL (doe NEP e zil) is used to treat mild to moderate dementia caused by Alzheimer's disease. This medicine may be used for other purposes; ask your health care provider or pharmacist if you have questions. COMMON BRAND NAME(S): Aricept What should I tell my health care provider before I take this medicine? They need to know if you have any of these conditions: -asthma or other lung disease -difficulty passing urine -head injury -heart disease -history of irregular heartbeat -liver disease -seizures (convulsions) -stomach or intestinal disease, ulcers or stomach bleeding -an unusual or allergic reaction to donepezil, other medicines, foods, dyes, or preservatives -pregnant or trying to get pregnant -breast-feeding How should I use this medicine? Take this medicine by mouth with a glass of water. Follow the directions on the prescription label. You may take this medicine with or without food. Take this medicine at regular intervals. This medicine is usually taken before bedtime. Do not take it more often  than directed. Continue to take your medicine even if you feel better. Do not stop taking except on your doctor's advice. If you are taking the 23 mg donepezil tablet, swallow it whole; do not cut, crush, or chew it. Talk to your pediatrician regarding the use of this medicine in children. Special care may be needed. Overdosage: If you think you have taken too much of this medicine contact a poison control center or emergency room at once. NOTE: This medicine is only for you. Do not share this medicine with others. What if I miss a dose? If you miss a dose, take it as soon as you can. If it is almost time for your next dose, take only that dose, do not take double or extra doses. What may interact with this medicine? Do not take this medicine with any of the following medications: -certain medicines for fungal infections like itraconazole, fluconazole, posaconazole, and voriconazole -cisapride -dextromethorphan; quinidine -dofetilide -dronedarone -pimozide -quinidine -thioridazine -ziprasidone This medicine may also interact with the following medications: -antihistamines for allergy, cough and cold -atropine -bethanechol -carbamazepine -certain medicines for bladder problems like oxybutynin, tolterodine -certain medicines for Parkinson's disease like benztropine, trihexyphenidyl -certain medicines for stomach problems like dicyclomine,  hyoscyamine -certain medicines for travel sickness like scopolamine -dexamethasone -ipratropium -NSAIDs, medicines for pain and inflammation, like ibuprofen or naproxen -other medicines for Alzheimer's disease -other medicines that prolong the QT interval (cause an abnormal heart rhythm) -phenobarbital -phenytoin -rifampin, rifabutin or rifapentine This list may not describe all possible interactions. Give your health care provider a list of all the medicines, herbs, non-prescription drugs, or dietary supplements you use. Also tell them if you  smoke, drink alcohol, or use illegal drugs. Some items may interact with your medicine. What should I watch for while using this medicine? Visit your doctor or health care professional for regular checks on your progress. Check with your doctor or health care professional if your symptoms do not get better or if they get worse. You may get drowsy or dizzy. Do not drive, use machinery, or do anything that needs mental alertness until you know how this drug affects you. What side effects may I notice from receiving this medicine? Side effects that you should report to your doctor or health care professional as soon as possible: -allergic reactions like skin rash, itching or hives, swelling of the face, lips, or tongue -feeling faint or lightheaded, falls -loss of bladder control -seizures -signs and symptoms of a dangerous change in heartbeat or heart rhythm like chest pain; dizziness; fast or irregular heartbeat; palpitations; feeling faint or lightheaded, falls; breathing problems -signs and symptoms of infection like fever or chills; cough; sore throat; pain or trouble passing urine -signs and symptoms of liver injury like dark yellow or brown urine; general ill feeling or flu-like symptoms; light-colored stools; loss of appetite; nausea; right upper belly pain; unusually weak or tired; yellowing of the eyes or skin -slow heartbeat or palpitations -unusual bleeding or bruising -vomiting Side effects that usually do not require medical attention (report to your doctor or health care professional if they continue or are bothersome): -diarrhea, especially when starting treatment -headache -loss of appetite -muscle cramps -nausea -stomach upset This list may not describe all possible side effects. Call your doctor for medical advice about side effects. You may report side effects to FDA at 1-800-FDA-1088. Where should I keep my medicine? Keep out of reach of children. Store at room temperature  between 15 and 30 degrees C (59 and 86 degrees F). Throw away any unused medicine after the expiration date. NOTE: This sheet is a summary. It may not cover all possible information. If you have questions about this medicine, talk to your doctor, pharmacist, or health care provider.  2019 Elsevier/Gold Standard (2015-11-13 21:00:42)

## 2018-08-10 NOTE — Telephone Encounter (Signed)
I called pt's husband [name]. Need to confirm what medications she is taking. Also when I speak with him, need to schedule pt a f/u appt with Megan in 6 months. We will be doing other testing I.e. formal neurocognitive testing in the meantime.
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
## 2018-08-11 ENCOUNTER — Telehealth: Payer: Self-pay | Admitting: Neurology

## 2018-08-11 NOTE — Telephone Encounter (Signed)
Call husband and discuss sleep eval or neuropsych testin

## 2018-08-11 NOTE — Telephone Encounter (Signed)
I called the pt's husband Mikeal Hawthorne again @ [phone] and LVM asking for call back. Gave office hours and number in message.

## 2018-08-12 ENCOUNTER — Encounter: Payer: Self-pay | Admitting: Adult Health

## 2018-08-12 NOTE — Telephone Encounter (Signed)
Pt returned RN's call. He was unable to hold. Please call him at [phone]

## 2018-08-12 NOTE — Addendum Note (Signed)
Addended by: Sarina Ill B on: 08/12/2018 02:36 PM   Modules accepted: Orders

## 2018-08-12 NOTE — Telephone Encounter (Signed)
[phone]. Left a message. Needed to talk to husband (not at last appointment) to see how much work up he wanted, patient has dementia possibly alzheimers or vascular or both. We could go forward with more testing but not sure it would change our management course too much. However happy to order formal neurocognitive testing for her. And she can return in 6 months, needs a follow up appointment. Bethany fyi.

## 2018-08-12 NOTE — Progress Notes (Signed)
Assessment and Plan:  Shannon Nielsen was seen today for acute visit.  Diagnoses and all orders for this visit:  Rash of hands Non-specific rash, ? Possibly eczema, consider yeast due to distinct borders; will send in topical steroid and antifungal for trial, monitor closely and call back if not improving in 1-2 weeks, or if worsening significantly.  Follow up as needed.  -     triamcinolone ointment (KENALOG) 0.1 %; Apply 1 application topically 2 (two) times daily. -     nystatin cream (MYCOSTATIN); Apply 1 application topically 2 (two) times daily.  Further disposition pending results of labs. Discussed med's effects and SE's.   Over 15 minutes of exam, counseling, chart review, and critical decision making was performed.   Future Appointments  Date Time Provider [place]  10/01/2018  3:00 PM Vicie Mutters, PA-C [facility] None  12/31/2018  9:00 AM Unk Pinto, MD [facility] None  02/23/2019  3:00 PM Ward Givens, NP [facility] None    ------------------------------------------------------------------------------------------------------------------   HPI 73 y.o.female presents for evaluation of rash to R hand; she noted 1 week ago, was itching, was erythematous, size of quarter or so. She reports has been pruritic, tried putting peroxide, reports it bubbled but didn't burn. Area has been slowly spreading over the last week. She has been applying vaseline over the past week. Denies discharge or pain in hand.   Past Medical History:  Diagnosis Date  . Breast cancer ([place])   . Chest tightness   . GERD (gastroesophageal reflux disease)   . Hepatitis C virus screening Negative 08/31/2012 03/08/2018  . Hyperlipidemia   . Hypertension   . Palpitations   . Prediabetes   . Vitamin D deficiency      Allergies  Allergen Reactions  . Minocycline   . Prednisone Swelling  . Sulfa Antibiotics     Patient does not remember the type of reaction, happened years ago.  . Zithromax  [Azithromycin] Swelling  . Ciprofloxacin Hcl Rash  . Penicillins Rash    Current Outpatient Medications on File Prior to Visit  Medication Sig  . aspirin 81 MG tablet Take 81 mg by mouth daily.   Marland Kitchen atenolol (TENORMIN) 100 MG tablet Take 1 tablet daily for BP  . Cholecalciferol (VITAMIN D PO) Take 2,000 Units by mouth daily.   Marland Kitchen donepezil (ARICEPT) 10 MG tablet Take 1 tablet (10 mg total) by mouth at bedtime.  . Dorzolamide HCl-Timolol Mal PF 22.3-6.8 MG/ML SOLN Apply 1 drop to eye 2 (two) times daily. Left eye  . fluticasone (FLONASE) 50 MCG/ACT nasal spray INSTILL 1 SPRAY INTO EACH NOSTRIL TWICE A DAY  . latanoprost (XALATAN) 0.005 % ophthalmic solution Place 1 drop into both eyes at bedtime.  Marland Kitchen letrozole (FEMARA) 2.5 MG tablet letrozole 2.5 mg tablet  . olmesartan (BENICAR) 40 MG tablet 1/2 pill daily for BP  . OVER THE COUNTER MEDICATION daily. B-Complex with 1000 mcg Biotin  . rosuvastatin (CRESTOR) 20 MG tablet TAKE 1 TABLET BY MOUTH EVERYDAY AT BEDTIME   No current facility-administered medications on file prior to visit.     ROS: all negative except above.   Physical Exam:  BP 136/82   Pulse 63   Temp (!) 97.5 F (36.4 C)   Ht 5\' 7"  (1.702 m)   Wt 112 lb (50.8 kg)   SpO2 97%   BMI 17.54 kg/m   General Appearance: Well nourished, in no apparent distress. Eyes: conjunctiva no swelling or erythema ENT/Mouth: Hearing normal.  Respiratory: Respiratory effort  normal Cardio: Brisk peripheral pulses without edema.  Lymphatics: Non tender without lymphadenopathy.  Musculoskeletal:  normal gait.  Skin: Warm, dry; she has rash to R hand dorsum, approx 8cm round area that is textured, distinct border, no discharge, irregularly textured, mildly/irregularly erythematous.  Neuro: Sensation intact.  Psych: Awake and oriented X 3, normal affect, Insight and Judgment appropriate.     Izora Ribas, NP 4:19 PM [facility]

## 2018-08-12 NOTE — Telephone Encounter (Signed)
Spoke with pt's husband. See other phone note. He stated meds have not changed since pt saw Dr. [name] 06/24/2018.

## 2018-08-12 NOTE — Telephone Encounter (Signed)
Packet addressed and placed with outgoing mail.

## 2018-08-12 NOTE — Telephone Encounter (Addendum)
Shannon Nielsen returned my call. I scheduled pt for f/u in 6 months on 02/23/2019 @ 3:00 arrival 2:30 pm. He also stated the meds had not changed since the last visit with Dr. [name] on 06/24/2018. Pt has picked up Donepezil prescription to start. I transferred him to Dr. [name] to finish the call.

## 2018-08-12 NOTE — Telephone Encounter (Signed)
I sent referral for formal memory testing. Would you please mail him on eof our dementia packets please Romelle Starcher? thanks

## 2018-08-13 ENCOUNTER — Ambulatory Visit (INDEPENDENT_AMBULATORY_CARE_PROVIDER_SITE_OTHER): Payer: Medicare HMO | Admitting: Adult Health

## 2018-08-13 ENCOUNTER — Encounter: Payer: Self-pay | Admitting: Adult Health

## 2018-08-13 VITALS — BP 136/82 | HR 63 | Temp 97.5°F | Ht 67.0 in | Wt 112.0 lb

## 2018-08-13 DIAGNOSIS — R21 Rash and other nonspecific skin eruption: Secondary | ICD-10-CM

## 2018-08-13 MED ORDER — NYSTATIN 100000 UNIT/GM EX CREA: 1.0000 "application " | TOPICAL_CREAM | Freq: Two times a day (BID) | CUTANEOUS | 1 refills | Status: DC

## 2018-08-13 MED ORDER — TRIAMCINOLONE ACETONIDE 0.1 % EX OINT: 1.0000 "application " | TOPICAL_OINTMENT | Freq: Two times a day (BID) | CUTANEOUS | 1 refills | Status: DC

## 2018-08-13 NOTE — Patient Instructions (Signed)
Call me back if not improving, any new symptoms or getting worse    Rash, Adult A rash is a change in the color of your skin. A rash can also change the way your skin feels. There are many different conditions and factors that can cause a rash. Some rashes may disappear after a few days, but some may last for a few weeks. Common causes of rashes include:  Viral infections, such as: ? Colds. ? Measles. ? Hand, foot, and mouth disease.  Bacterial infections, such as: ? Scarlet fever. ? Impetigo.  Fungal infections, such as Candida.  Allergic reactions to food, medicines, or skin care products. Follow these instructions at home: The goal of treatment is to stop the itching and keep the rash from spreading. Pay attention to any changes in your symptoms. Follow these instructions to help with your condition: Medicine Take or apply over-the-counter and prescription medicines only as told by your health care provider. These may include:  Corticosteroid creams to treat red or swollen skin.  Anti-itch lotions.  Oral allergy medicines (antihistamines).  Oral corticosteroids for severe symptoms.  Skin care  Apply cool compresses to the affected areas.  Do not scratch or rub your skin.  Avoid covering the rash. Make sure the rash is exposed to air as much as possible. Managing itching and discomfort  Avoid hot showers or baths, which can make itching worse. A cold shower may help.  Try taking a bath with: ? Epsom salts. Follow manufacturer instructions on the packaging. You can get these at your local pharmacy or grocery store. ? Baking soda. Pour a small amount into the bath as told by your health care provider. ? Colloidal oatmeal. Follow manufacturer instructions on the packaging. You can get this at your local pharmacy or grocery store.  Try applying baking soda paste to your skin. Stir water into baking soda until it reaches a paste-like consistency.  Try applying calamine  lotion. This is an over-the-counter lotion that helps to relieve itchiness.  Keep cool and out of the sun. Sweating and being hot can make itching worse. General instructions   Rest as needed.  Drink enough fluid to keep your urine pale yellow.  Wear loose-fitting clothing.  Avoid scented soaps, detergents, and perfumes. Use gentle soaps, detergents, perfumes, and other cosmetic products.  Avoid any substance that causes your rash. Keep a journal to help track what causes your rash. Write down: ? What you eat. ? What cosmetic products you use. ? What you drink. ? What you wear. This includes jewelry.  Keep all follow-up visits as told by your health care provider. This is important. Contact a health care provider if:  You sweat at night.  You lose weight.  You urinate more than normal.  You urinate less than normal, or you notice that your urine is a darker color than usual.  You feel weak.  You vomit.  Your skin or the whites of your eyes look yellow (jaundice).  Your skin: ? Tingles. ? Is numb.  Your rash: ? Does not go away after several days. ? Gets worse.  You are: ? Unusually thirsty. ? More tired than normal.  You have: ? New symptoms. ? Pain in your abdomen. ? A fever. ? Diarrhea. Get help right away if you:  Have a fever and your symptoms suddenly get worse.  Develop confusion.  Have a severe headache or a stiff neck.  Have severe joint pains or stiffness.  Have a seizure.  Develop a rash that covers all or most of your body. The rash may or may not be painful.  Develop blisters that: ? Are on top of the rash. ? Grow larger or grow together. ? Are painful. ? Are inside your nose or mouth.  Develop a rash that: ? Looks like purple pinprick-sized spots all over your body. ? Has a "bull's eye" or looks like a target. ? Is not related to sun exposure, is red and painful, and causes your skin to peel. Summary  A rash is a change in  the color of your skin. Some rashes disappear after a few days, but some may last for a few weeks.  The goal of treatment is to stop the itching and keep the rash from spreading.  Take or apply over-the-counter and prescription medicines only as told by your health care provider.  Contact a health care provider if you have new or worsening symptoms.  Keep all follow-up visits as told by your health care provider. This is important. This information is not intended to replace advice given to you by your health care provider. Make sure you discuss any questions you have with your health care provider. Document Released: 05/17/2002 Document Revised: 12/29/2017 Document Reviewed: 12/29/2017 Elsevier Interactive Patient Education  2019 [employer].

## 2018-08-19 ENCOUNTER — Ambulatory Visit: Payer: Self-pay | Admitting: Internal Medicine

## 2018-09-03 ENCOUNTER — Ambulatory Visit: Payer: Self-pay | Admitting: Physician Assistant

## 2018-09-15 DIAGNOSIS — H401111 Primary open-angle glaucoma, right eye, mild stage: Secondary | ICD-10-CM | POA: Diagnosis not present

## 2018-09-15 DIAGNOSIS — H2512 Age-related nuclear cataract, left eye: Secondary | ICD-10-CM | POA: Diagnosis not present

## 2018-09-15 DIAGNOSIS — Z961 Presence of intraocular lens: Secondary | ICD-10-CM | POA: Diagnosis not present

## 2018-09-15 DIAGNOSIS — H402223 Chronic angle-closure glaucoma, left eye, severe stage: Secondary | ICD-10-CM | POA: Diagnosis not present

## 2018-09-25 DIAGNOSIS — Z1159 Encounter for screening for other viral diseases: Secondary | ICD-10-CM | POA: Diagnosis not present

## 2018-09-25 DIAGNOSIS — Z01812 Encounter for preprocedural laboratory examination: Secondary | ICD-10-CM | POA: Diagnosis not present

## 2018-09-28 DIAGNOSIS — I129 Hypertensive chronic kidney disease with stage 1 through stage 4 chronic kidney disease, or unspecified chronic kidney disease: Secondary | ICD-10-CM | POA: Diagnosis not present

## 2018-09-28 DIAGNOSIS — Z7982 Long term (current) use of aspirin: Secondary | ICD-10-CM | POA: Diagnosis not present

## 2018-09-28 DIAGNOSIS — H2512 Age-related nuclear cataract, left eye: Secondary | ICD-10-CM | POA: Diagnosis not present

## 2018-09-28 DIAGNOSIS — E7800 Pure hypercholesterolemia, unspecified: Secondary | ICD-10-CM | POA: Diagnosis not present

## 2018-09-28 DIAGNOSIS — H25811 Combined forms of age-related cataract, right eye: Secondary | ICD-10-CM | POA: Diagnosis not present

## 2018-09-28 DIAGNOSIS — Z79899 Other long term (current) drug therapy: Secondary | ICD-10-CM | POA: Diagnosis not present

## 2018-09-28 DIAGNOSIS — N189 Chronic kidney disease, unspecified: Secondary | ICD-10-CM | POA: Diagnosis not present

## 2018-09-28 DIAGNOSIS — H401123 Primary open-angle glaucoma, left eye, severe stage: Secondary | ICD-10-CM | POA: Diagnosis not present

## 2018-09-28 DIAGNOSIS — G629 Polyneuropathy, unspecified: Secondary | ICD-10-CM | POA: Diagnosis not present

## 2018-09-28 DIAGNOSIS — E042 Nontoxic multinodular goiter: Secondary | ICD-10-CM | POA: Diagnosis not present

## 2018-09-28 DIAGNOSIS — Z853 Personal history of malignant neoplasm of breast: Secondary | ICD-10-CM | POA: Diagnosis not present

## 2018-09-30 NOTE — Progress Notes (Addendum)
MEDICARE ANNUAL WELLNESS VISIT  Assessment:   Imbalance Would like a place with visits after 5 if possible Husband takes her Would like PT Follow up with neuro  Encounter for Medicare annual wellness exam 1 year  Essential hypertension - continue medications, DASH diet, exercise and monitor at home. Call if greater than 130/80.  -     CBC with Differential/Platelet -     COMPLETE METABOLIC PANEL WITH GFR -     TSH  Hyperparathyroidism, primary ([place]) Continue to check labs  Vascular dementia without behavioral disturbance ([place]) -     Ambulatory referral to Sleep Studies -     memantine (NAMENDA) 10 MG tablet; Take 1 tablet (10 mg total) by mouth 2 (two) times daily. Will continue aricept and per Dr. Cathren Laine note will do namenda, given information, and will call Dr. Erick Alley.   Mild malnutrition Increase weight Monitor closely If continuing weight loss with breast cancer history and mild epigastric pain, we will get a CT AB/Chest- declines at this time Given ensure/boost samples  Hyperlipidemia, mixed -     Lipid panel check lipids decrease fatty foods increase activity.  Abnormal glucose Discussed disease progression and risks Discussed diet/exercise, weight management and risk modification -     Hemoglobin A1c  B12 deficiency - B12 is low end of normal, add sublingual B12.   Memory changes -     Ambulatory referral to Sleep Studies -     memantine (NAMENDA) 10 MG tablet; Take 1 tablet (10 mg total) by mouth 2 (two) times daily.  Iron deficiency Continue iron  Open-angle glaucoma of right eye, unspecified glaucoma stage, unspecified open-angle glaucoma type S/p surgery doing well  Body mass index (BMI) of 19.0-19.9 in adult Increase weight Monitor closely If continuing weight loss with breast cancer history and mild epigastric pain, we will get a CT AB/Chest- declines at this time Given ensure/boost samples  Medication management -      Magnesium  Vitamin D deficiency Continue supplement  Malignant neoplasm of upper-outer quadrant of left breast in female, estrogen receptor positive ([place]) S/p mastectomy  Gastroesophageal reflux disease, esophagitis presence not specified Continue PPI/H2 blocker, diet discussed   Over 40 minutes of exam, counseling, chart review and critical decision making was performed Future Appointments  Date Time Provider [place]  12/31/2018  9:00 AM Unk Pinto, MD [facility] None  02/23/2019  3:00 PM Ward Givens, NP [facility] None     Plan:   During the course of the visit the patient was educated and counseled about appropriate screening and preventive services including:    Pneumococcal vaccine   Prevnar 13  Influenza vaccine  Td vaccine  Screening electrocardiogram  Bone densitometry screening  Colorectal cancer screening  Diabetes screening  Glaucoma screening  Nutrition counseling   Advanced directives: requested   Subjective:  Shannon Nielsen is a 73 y.o. female who presents for Medicare Annual Wellness Visit and complete physical.    Had glaucoma and cataract surgery at [facility] 09/29/2018 with Dr. Arlis Porta. Eye is still red and some feeling of grit in it.  Rash is still there but better on her right hand, will retry the ointment but will do a 0.5% and do a glove at night.   She is following with Dr. [name] for memory changes, had MRI brain 10/06/2017.  She has a FM HX of dementia on her mother's side. Started on aricept, no side effects but feeling it is not helping, will start on namenda.  BMI is Body mass index is 18.17 kg/m.  a year ago she was 127, she is 116 today. Husband states she eats like a bird.  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 She denies chest pain, shortness of breath, dizziness.  She is on cholesterol medication and denies myalgias. Her cholesterol is at goal. The cholesterol last visit was:   Lab Results  Component Value Date   CHOL 138 06/24/2018   HDL 64 06/24/2018   LDLCALC 61 06/24/2018   TRIG 57 06/24/2018   CHOLHDL 2.2 06/24/2018   She has a history of preDM but has decreased sugar and has increased water. Last A1C in the office was:  Lab Results  Component Value Date   HGBA1C 5.6 06/24/2018   Last GFR: Lab Results  Component Value Date   GFRAA 79 06/24/2018   Patient is on Vitamin D supplement.   Lab Results  Component Value Date   VD25OH 34 06/24/2018     BMI is Body mass index is 18.17 kg/m., Wt Readings from Last 10 Encounters:  10/01/18 116 lb (52.6 kg)  08/13/18 112 lb (50.8 kg)  08/10/18 116 lb (52.6 kg)  06/24/18 115 lb (52.2 kg)  03/03/18 119 lb (54 kg)  10/27/17 127 lb (57.6 kg)  09/24/17 125 lb (56.7 kg)  08/12/17 123 lb 9.6 oz (56.1 kg)  03/11/17 124 lb 9.6 oz (56.5 kg)  11/18/16 125 lb (56.7 kg)    Medication Review: Current Outpatient Medications on File Prior to Visit  Medication Sig Dispense Refill  . aspirin 81 MG tablet Take 81 mg by mouth daily.     . Cholecalciferol (VITAMIN D PO) Take 2,000 Units by mouth daily.     Marland Kitchen donepezil (ARICEPT) 10 MG tablet Take 1 tablet (10 mg total) by mouth at bedtime. 30 tablet 6  . Dorzolamide HCl-Timolol Mal PF 22.3-6.8 MG/ML SOLN Apply 1 drop to eye 2 (two) times daily. Left eye    . fluticasone (FLONASE) 50 MCG/ACT nasal spray INSTILL 1 SPRAY INTO EACH NOSTRIL TWICE A DAY 48 g 3  . latanoprost (XALATAN) 0.005 % ophthalmic solution Place 1 drop into both eyes at bedtime.    Marland Kitchen letrozole (FEMARA) 2.5 MG tablet letrozole 2.5 mg tablet    . nystatin cream (MYCOSTATIN) Apply 1 application topically 2 (two) times daily. 30 g 1  . olmesartan (BENICAR) 40 MG tablet 1/2 pill daily  for BP 90 tablet 1  . OVER THE COUNTER MEDICATION daily. B-Complex with 1000 mcg Biotin    . rosuvastatin (CRESTOR) 20 MG tablet TAKE 1 TABLET BY MOUTH EVERYDAY AT BEDTIME 90 tablet 1  . triamcinolone ointment (KENALOG) 0.1 % Apply 1 application topically 2 (two) times daily. 80 g 1  . atenolol (TENORMIN) 100 MG tablet Take 1 tablet daily for BP 90 tablet 3   No current facility-administered medications on file prior to visit.     Allergies  Allergen Reactions  . Minocycline   . Prednisone Swelling  . Sulfa Antibiotics     Patient does not remember the type of reaction, happened years ago.  . Zithromax [Azithromycin] Swelling  . Ciprofloxacin Hcl Rash  . Penicillins Rash  Current Problems (verified) Patient Active Problem List   Diagnosis Date Noted  . Vascular dementia without behavioral disturbance ([place]) 06/22/2018  . B12 deficiency 06/22/2018  . Abnormal glucose 06/22/2018  . Memory changes 10/27/2017  . Iron deficiency 11/30/2016  . Body mass index (BMI) of 19.0-19.9 in adult 05/08/2015  . Open-angle glaucoma 05/08/2015  . Medication management 08/30/2013  . Vitamin D deficiency   . GERD (gastroesophageal reflux disease)   . Hyperparathyroidism, primary ([place]) 08/12/2012  . Breast cancer of upper-outer quadrant of left female breast ([place]) 07/08/2011  . Hyperlipidemia, mixed 10/18/2010  . Essential hypertension 10/18/2010    Screening Tests Immunization History  Administered Date(s) Administered  . Influenza, High Dose Seasonal PF 03/07/2014, 05/08/2015, 03/11/2017, 03/03/2018  . PPD Test 08/30/2013, 10/04/2014, 10/24/2015  . Pneumococcal Conjugate-13 03/24/2014  . Pneumococcal Polysaccharide-23 09/08/2006, 03/11/2017  . Tdap 08/27/2012  . Zoster 09/03/2012   Preventative care: Last colonoscopy: 2014 Last mammogram: 2011 s/p double mastectomy Last pap smear/pelvic exam: 2015 declines another DEXA:04/2016 osteopenia Stress test 2012  Prior vaccinations: TD  or Tdap: 2014  Influenza: 2019 Pneumococcal: 2018 Prevnar13: 2015 Shingles/Zostavax: 2014  Names of Other Physician/Practitioners you currently use: 1. [facility] here for primary care 2. Delman Cheadle, eye doctor, last visit 01/2018 3. Dr. Lilian Coma dentist, last visit q 6 months Patient Care Team: Unk Pinto, MD as PCP - General (Internal Medicine) Clarene Essex, MD as Consulting Physician (Gastroenterology) Meda Klinefelter, MD as Consulting Physician (Endocrinology) Mordecai Rasmussen, MD as Consulting Physician (Surgery)  SURGICAL HISTORY She  has a past surgical history that includes Mastectomy (August 2011); Parathyroidectomy; and Nasal sinus surgery. FAMILY HISTORY Her family history includes Alzheimer's disease in her mother; Cancer in her mother; Dementia in her maternal grandfather and maternal grandmother; Diabetes in her mother; Heart attack in her brother; Heart attack (age of onset: 8) in her father. SOCIAL HISTORY She  reports that she has never smoked. She has never used smokeless tobacco. She reports that she does not drink alcohol or use drugs.   MEDICARE WELLNESS OBJECTIVES: Physical activity: Current Exercise Habits: The patient does not participate in regular exercise at present Cardiac risk factors: Cardiac Risk Factors include: advanced age (>33men, >1 women);hypertension;dyslipidemia;sedentary lifestyle Depression/mood screen:   Depression screen [facility] 2/9 10/01/2018  Decreased Interest 0  Down, Depressed, Hopeless 0  PHQ - 2 Score 0    ADLs:  In your present state of health, do you have any difficulty performing the following activities: 10/01/2018 06/24/2018  Hearing? N N  Vision? N N  Difficulty concentrating or making decisions? Y N  Comment - -  Walking or climbing stairs? N N  Dressing or bathing? Y N  Doing errands, shopping? Y N  Preparing Food and eating ? Y -  Using the Toilet? N -  In the past six  months, have you accidently leaked urine? N -  Do you have problems with loss of bowel control? N -  Managing your Medications? Y -  Managing your Finances? Y -  Housekeeping or managing your Housekeeping? Y -  Some recent data might be hidden     Cognitive Testing  Alert? Yes  Normal Appearance?Yes  Oriented to person? Yes  Place? Yes   Time? Yes  Recall of three objects?  Yes  Can perform simple calculations? Yes  Displays appropriate judgment?Yes  Can read the correct time from a watch face?Yes  EOL planning: Does Patient Have a Medical Advance Directive?: No Would  patient like information on creating a medical advance directive?: No - Patient declined, Yes (MAU/Ambulatory/Procedural Areas - Information given)  Review of Systems  Constitutional: Negative.   HENT: Negative.   Eyes: Negative.   Respiratory: Negative.   Cardiovascular: Negative.   Gastrointestinal: Negative.   Genitourinary: Negative.   Musculoskeletal: Negative.   Skin: Negative.   Neurological: Negative.   Endo/Heme/Allergies: Negative.   Psychiatric/Behavioral: Negative.      Objective:     Today's Vitals   10/01/18 1228  BP: 126/74  Pulse: 62  Temp: 97.6 F (36.4 C)  SpO2: 99%  Weight: 116 lb (52.6 kg)  Height: 5\' 7"  (1.702 m)  PainSc: 0-No pain   Body mass index is 18.17 kg/m.  General appearance: alert, no distress, WD/WN, female HEENT: normocephalic, sclerae anicteric, TMs pearly, nares patent, no discharge or erythema, pharynx normal Oral cavity: MMM, no lesions Neck: supple, no lymphadenopathy, no thyromegaly, no masses Heart: RRR, normal S1, S2, no murmurs Lungs: CTA bilaterally, no wheezes, rhonchi, or rales Abdomen: +bs, soft, non tender, non distended, no masses, no hepatomegaly, no splenomegaly Musculoskeletal: nontender, no swelling, no obvious deformity Extremities: no edema, no cyanosis, no clubbing Pulses: 2+ symmetric, upper and lower extremities, normal cap  refill Neurological: alert, oriented x 3, CN2-12 intact, strength normal upper extremities and lower extremities, sensation normal throughout, DTRs 2+ throughout, no cerebellar signs, gait normal Psychiatric: normal affect, behavior normal, pleasant   Medicare Attestation I have personally reviewed: The patient's medical and social history Their use of alcohol, tobacco or illicit drugs Their current medications and supplements The patient's functional ability including ADLs,fall risks, home safety risks, cognitive, and hearing and visual impairment Diet and physical activities Evidence for depression or mood disorders  The patient's weight, height, BMI, and visual acuity have been recorded in the chart.  I have made referrals, counseling, and provided education to the patient based on review of the above and I have provided the patient with a written personalized care plan for preventive services.     Vicie Mutters, PA-C   10/01/2018

## 2018-10-01 ENCOUNTER — Ambulatory Visit: Payer: Self-pay | Admitting: Physician Assistant

## 2018-10-01 ENCOUNTER — Encounter: Payer: Self-pay | Admitting: Physician Assistant

## 2018-10-01 ENCOUNTER — Ambulatory Visit (INDEPENDENT_AMBULATORY_CARE_PROVIDER_SITE_OTHER): Payer: Medicare HMO | Admitting: Physician Assistant

## 2018-10-01 ENCOUNTER — Other Ambulatory Visit: Payer: Self-pay

## 2018-10-01 VITALS — BP 126/74 | HR 62 | Temp 97.6°F | Ht 67.0 in | Wt 116.0 lb

## 2018-10-01 DIAGNOSIS — E441 Mild protein-calorie malnutrition: Secondary | ICD-10-CM | POA: Diagnosis not present

## 2018-10-01 DIAGNOSIS — R6889 Other general symptoms and signs: Secondary | ICD-10-CM

## 2018-10-01 DIAGNOSIS — H4010X0 Unspecified open-angle glaucoma, stage unspecified: Secondary | ICD-10-CM | POA: Diagnosis not present

## 2018-10-01 DIAGNOSIS — Z0001 Encounter for general adult medical examination with abnormal findings: Secondary | ICD-10-CM

## 2018-10-01 DIAGNOSIS — F0150 Vascular dementia without behavioral disturbance: Secondary | ICD-10-CM

## 2018-10-01 DIAGNOSIS — Z170 Estrogen receptor positive status [ER+]: Secondary | ICD-10-CM

## 2018-10-01 DIAGNOSIS — R2689 Other abnormalities of gait and mobility: Secondary | ICD-10-CM

## 2018-10-01 DIAGNOSIS — R7309 Other abnormal glucose: Secondary | ICD-10-CM | POA: Diagnosis not present

## 2018-10-01 DIAGNOSIS — E611 Iron deficiency: Secondary | ICD-10-CM | POA: Diagnosis not present

## 2018-10-01 DIAGNOSIS — I10 Essential (primary) hypertension: Secondary | ICD-10-CM

## 2018-10-01 DIAGNOSIS — Z681 Body mass index (BMI) 19 or less, adult: Secondary | ICD-10-CM

## 2018-10-01 DIAGNOSIS — Z79899 Other long term (current) drug therapy: Secondary | ICD-10-CM | POA: Diagnosis not present

## 2018-10-01 DIAGNOSIS — Z0000 Encounter for general adult medical examination without abnormal findings: Secondary | ICD-10-CM

## 2018-10-01 DIAGNOSIS — E782 Mixed hyperlipidemia: Secondary | ICD-10-CM

## 2018-10-01 DIAGNOSIS — E538 Deficiency of other specified B group vitamins: Secondary | ICD-10-CM

## 2018-10-01 DIAGNOSIS — K219 Gastro-esophageal reflux disease without esophagitis: Secondary | ICD-10-CM

## 2018-10-01 DIAGNOSIS — R69 Illness, unspecified: Secondary | ICD-10-CM | POA: Diagnosis not present

## 2018-10-01 DIAGNOSIS — E210 Primary hyperparathyroidism: Secondary | ICD-10-CM

## 2018-10-01 DIAGNOSIS — R413 Other amnesia: Secondary | ICD-10-CM | POA: Diagnosis not present

## 2018-10-01 DIAGNOSIS — E559 Vitamin D deficiency, unspecified: Secondary | ICD-10-CM

## 2018-10-01 DIAGNOSIS — C50412 Malignant neoplasm of upper-outer quadrant of left female breast: Secondary | ICD-10-CM

## 2018-10-01 MED ORDER — MEMANTINE HCL 10 MG PO TABS: 10.0000 mg | ORAL_TABLET | Freq: Two times a day (BID) | ORAL | 3 refills | Status: DC

## 2018-10-01 NOTE — Patient Instructions (Addendum)
WEIGHT GAIN  Try to make sure you are eating plenty of high calorie dense foods like: Avocado Nuts Peanut butter Dates [place] yogurt Protein powder Ensure/Boost  If she does not have weight gain or has any other symptoms with her breast cancer history I would suggest getting a CT chest and AB  Start Namenda (memantine) 10mg  tablets.  Take 1/2 tablet at bedtime for 7 days, then 1/2 tablet twice daily for 7 days, then 1/2 tablet in morning and 1 tablet at bedtime for 7 days, then 1 tablet twice daily.    Side effects include dizziness, headache, diarrhea or constipation.   Can start here but follow up with Dr. [name].

## 2018-10-02 LAB — CBC WITH DIFFERENTIAL/PLATELET
Absolute Monocytes: 261 cells/uL (ref 200–950)
Basophils Absolute: 20 cells/uL (ref 0–200)
Basophils Relative: 0.6 %
Eosinophils Absolute: 10 cells/uL — ABNORMAL LOW (ref 15–500)
Eosinophils Relative: 0.3 %
HCT: 43.1 % (ref 35.0–45.0)
Hemoglobin: 14.5 g/dL (ref 11.7–15.5)
Lymphs Abs: 799 cells/uL — ABNORMAL LOW (ref 850–3900)
MCH: 31.9 pg (ref 27.0–33.0)
MCHC: 33.6 g/dL (ref 32.0–36.0)
MCV: 94.7 fL (ref 80.0–100.0)
MPV: 9.8 fL (ref 7.5–12.5)
Monocytes Relative: 7.9 %
Neutro Abs: 2211 cells/uL (ref 1500–7800)
Neutrophils Relative %: 67 %
Platelets: 182 10*3/uL (ref 140–400)
RBC: 4.55 10*6/uL (ref 3.80–5.10)
RDW: 11.9 % (ref 11.0–15.0)
Total Lymphocyte: 24.2 %
WBC: 3.3 10*3/uL — ABNORMAL LOW (ref 3.8–10.8)

## 2018-10-02 LAB — COMPLETE METABOLIC PANEL WITH GFR
AG Ratio: 1.9 (calc) (ref 1.0–2.5)
ALT: 27 U/L (ref 6–29)
AST: 28 U/L (ref 10–35)
Albumin: 4.4 g/dL (ref 3.6–5.1)
Alkaline phosphatase (APISO): 99 U/L (ref 37–153)
BUN/Creatinine Ratio: 14 (calc) (ref 6–22)
BUN: 15 mg/dL (ref 7–25)
CO2: 29 mmol/L (ref 20–32)
Calcium: 10.4 mg/dL (ref 8.6–10.4)
Chloride: 108 mmol/L (ref 98–110)
Creat: 1.09 mg/dL — ABNORMAL HIGH (ref 0.60–0.93)
GFR, Est African American: 60 mL/min/{1.73_m2} (ref >=60)
GFR, Est Non African American: 51 mL/min/{1.73_m2} — ABNORMAL LOW (ref >=60)
Globulin: 2.3 g/dL (calc) (ref 1.9–3.7)
Glucose, Bld: 98 mg/dL (ref 65–99)
Potassium: 4.7 mmol/L (ref 3.5–5.3)
Sodium: 143 mmol/L (ref 135–146)
Total Bilirubin: 1.5 mg/dL — ABNORMAL HIGH (ref 0.2–1.2)
Total Protein: 6.7 g/dL (ref 6.1–8.1)

## 2018-10-02 LAB — LIPID PANEL
Cholesterol: 153 mg/dL (ref <200)
HDL: 68 mg/dL (ref >=50)
LDL Cholesterol (Calc): 70 mg/dL (calc)
Non-HDL Cholesterol (Calc): 85 mg/dL (calc) (ref <130)
Total CHOL/HDL Ratio: 2.3 (calc) (ref <5.0)
Triglycerides: 66 mg/dL (ref <150)

## 2018-10-02 LAB — HEMOGLOBIN A1C
Hgb A1c MFr Bld: 5.7 % of total Hgb — ABNORMAL HIGH (ref <5.7)
Mean Plasma Glucose: 117 (calc)
eAG (mmol/L): 6.5 (calc)

## 2018-10-02 LAB — MAGNESIUM: Magnesium: 2.1 mg/dL (ref 1.5–2.5)

## 2018-10-02 LAB — TSH: TSH: 0.46 mIU/L (ref 0.40–4.50)

## 2018-10-14 ENCOUNTER — Other Ambulatory Visit: Payer: Self-pay | Admitting: Internal Medicine

## 2018-10-15 ENCOUNTER — Other Ambulatory Visit: Payer: Self-pay | Admitting: Internal Medicine

## 2018-10-15 DIAGNOSIS — I10 Essential (primary) hypertension: Secondary | ICD-10-CM

## 2018-10-23 ENCOUNTER — Other Ambulatory Visit: Payer: Self-pay | Admitting: Physician Assistant

## 2018-10-23 DIAGNOSIS — F0150 Vascular dementia without behavioral disturbance: Secondary | ICD-10-CM

## 2018-10-23 DIAGNOSIS — R413 Other amnesia: Secondary | ICD-10-CM

## 2018-11-03 ENCOUNTER — Telehealth: Payer: Self-pay | Admitting: *Deleted

## 2018-11-03 NOTE — Telephone Encounter (Signed)
Returned husband's call and was unable to reach him. LVM with office number.

## 2018-11-03 NOTE — Telephone Encounter (Signed)
Pt husband called has a question about wife medication. Please call  [phone]

## 2018-11-04 NOTE — Addendum Note (Signed)
Addended by: Vicie Mutters R on: 11/04/2018 01:40 PM   Modules accepted: Orders

## 2018-11-04 NOTE — Telephone Encounter (Signed)
Spoke with husband, Mikeal Hawthorne who stated Dr [name] prescribed donepezil and the patient also was prescribed memantine. He asked if she was to take both medications. I advised him per note by Vicie Mutters PA on 10/01/18 that she is to take both medications. He  verbalized understanding, appreciation.

## 2018-11-04 NOTE — Telephone Encounter (Addendum)
Pt husband has returned the call to [facility], he is asking for a cal back.  Husband is asking to hear from someone to clarify which medication pt should continue to take vrs which one to no longer take. Please call(message routed to work in Therapist, sports)

## 2018-11-05 ENCOUNTER — Telehealth: Payer: Self-pay | Admitting: Physician Assistant

## 2018-11-05 DIAGNOSIS — R634 Abnormal weight loss: Secondary | ICD-10-CM

## 2018-11-05 DIAGNOSIS — R0602 Shortness of breath: Secondary | ICD-10-CM

## 2018-11-05 NOTE — Telephone Encounter (Signed)
-----   Message from Elenor Quinones, [place] sent at 11/05/2018  3:52 PM EDT ----- Regarding: return call Contact: [phone] Husband states he has called 3 times now.   He reports his wife's wt as being 109lb.  Per patient's husband please return call when you can.  Per office note:

## 2018-11-13 ENCOUNTER — Ambulatory Visit
Admission: RE | Admit: 2018-11-13 | Discharge: 2018-11-13 | Disposition: A | Discharge status: Home / Self Care | Payer: Medicare HMO | Source: Ambulatory Visit | Attending: Physician Assistant | Admitting: Physician Assistant

## 2018-11-13 DIAGNOSIS — R0602 Shortness of breath: Secondary | ICD-10-CM

## 2018-11-13 DIAGNOSIS — R634 Abnormal weight loss: Secondary | ICD-10-CM

## 2018-11-13 DIAGNOSIS — N858 Other specified noninflammatory disorders of uterus: Secondary | ICD-10-CM | POA: Diagnosis not present

## 2018-11-16 ENCOUNTER — Other Ambulatory Visit: Payer: Self-pay | Admitting: Physician Assistant

## 2018-11-16 DIAGNOSIS — N852 Hypertrophy of uterus: Secondary | ICD-10-CM

## 2018-11-17 DIAGNOSIS — H543 Unqualified visual loss, both eyes: Secondary | ICD-10-CM | POA: Diagnosis not present

## 2018-11-18 DIAGNOSIS — Z20828 Contact with and (suspected) exposure to other viral communicable diseases: Secondary | ICD-10-CM | POA: Diagnosis not present

## 2018-11-30 ENCOUNTER — Ambulatory Visit
Admission: RE | Admit: 2018-11-30 | Discharge: 2018-11-30 | Disposition: A | Discharge status: Home / Self Care | Payer: Medicare HMO | Source: Ambulatory Visit | Attending: Physician Assistant | Admitting: Physician Assistant

## 2018-11-30 DIAGNOSIS — D251 Intramural leiomyoma of uterus: Secondary | ICD-10-CM | POA: Diagnosis not present

## 2018-11-30 DIAGNOSIS — N852 Hypertrophy of uterus: Secondary | ICD-10-CM

## 2018-12-01 NOTE — Progress Notes (Signed)
12/01/2018-Spoke with patient's husband and gave him results for the AB ultrasound. -Marcelino Scot

## 2018-12-17 ENCOUNTER — Other Ambulatory Visit: Payer: Self-pay | Admitting: Adult Health

## 2018-12-17 DIAGNOSIS — H5213 Myopia, bilateral: Secondary | ICD-10-CM | POA: Diagnosis not present

## 2018-12-17 DIAGNOSIS — R413 Other amnesia: Secondary | ICD-10-CM

## 2018-12-17 DIAGNOSIS — H401111 Primary open-angle glaucoma, right eye, mild stage: Secondary | ICD-10-CM | POA: Diagnosis not present

## 2018-12-17 DIAGNOSIS — Z961 Presence of intraocular lens: Secondary | ICD-10-CM | POA: Diagnosis not present

## 2018-12-17 DIAGNOSIS — F0150 Vascular dementia without behavioral disturbance: Secondary | ICD-10-CM

## 2018-12-17 DIAGNOSIS — H401123 Primary open-angle glaucoma, left eye, severe stage: Secondary | ICD-10-CM | POA: Diagnosis not present

## 2018-12-25 ENCOUNTER — Other Ambulatory Visit: Payer: Self-pay | Admitting: Internal Medicine

## 2018-12-30 ENCOUNTER — Encounter: Payer: Self-pay | Admitting: Internal Medicine

## 2018-12-30 NOTE — Patient Instructions (Signed)

## 2018-12-30 NOTE — Progress Notes (Signed)
[facility] Unk Pinto, M.D.     Uvaldo Bristle. Silverio Lay, P.A.-C Liane Com[address], [place], [place] [address] Telephone [phone] Telefax [phone] Annual Screening/Preventative Visit & Comprehensive Evaluation &  Examination     This very nice 73 y.o. MBF presents for a Screening /Preventative Visit & comprehensive evaluation and management of multiple medical co-morbidities.  Patient has been followed for HTN, HLD, Prediabetes, hx/o Breast Cancer (2011)  and Vitamin D Deficiency. Her GERD is controlled with diet.  Patient has Vascular Dementia and is also followed by  Neurologist -Dr Sarina Ill. Patient is on [place], attends to her personal hygiene & ADL's and requires moderate supervision per husband to direct her activities.      Patient has remote hx/o primary hyperparathyroidism and excision of parathyroid adenoma ( 2001). Patient has had gradual weight loss w/o obvious cause and she attributes the loss to poor appetite and smaller portion sizes.        HTN predates since 110. Patient's BP has been controlled at home and patient denies any cardiac symptoms as chest pain, palpitations, shortness of breath, dizziness or ankle swelling. Today's BP is at goal -122/80.     Patient's hyperlipidemia is controlled with diet and medications. Patient denies myalgias or other medication SE's. Last lipids were at goal: Lab Results  Component Value Date   CHOL 153 10/01/2018   HDL 68 10/01/2018   LDLCALC 70 10/01/2018   TRIG 66 10/01/2018   CHOLHDL 2.3 10/01/2018      Patient has hx/o prediabetes predating(A1c 6.0%/2011) and patient denies reactive hypoglycemic symptoms, visual blurring, diabetic polys or paresthesias. Last A1c was not at goal: Lab Results  Component Value Date   HGBA1C 5.7 (H) 10/01/2018      Finally, patient has history of Vitamin D Deficiency  ("13"/2008) and last Vitamin D was still low: Lab Results  Component Value Date   VD25OH 34 06/24/2018   Current Outpatient Medications on File Prior to Visit  Medication Sig  . aspirin 81 MG tablet Take 81 mg by mouth daily.   Marland Kitchen atenolol (TENORMIN) 100 MG tablet TAKE 1 TABLET DAILY FOR BLOOD PRESSURE  . Cholecalciferol (VITAMIN D PO) Take 2,000 Units by mouth daily.   Marland Kitchen donepezil (ARICEPT) 10 MG tablet Take 1 tablet (10 mg total) by mouth at bedtime.  . fluticasone (FLONASE) 50 MCG/ACT nasal spray INSTILL 1 SPRAY INTO EACH NOSTRIL TWICE A DAY  . letrozole (FEMARA) 2.5 MG tablet letrozole 2.5 mg tablet  . olmesartan (BENICAR) 40 MG tablet 1/2 pill daily for BP  . OVER THE COUNTER MEDICATION daily. B-Complex with 1000 mcg Biotin  . quinapril (ACCUPRIL) 20 MG tablet TAKE 1 TABLET BY MOUTH EVERY DAY  . rosuvastatin (CRESTOR) 20 MG tablet TAKE 1 TABLET BY MOUTH EVERYDAY AT BEDTIME  . Dorzolamide HCl-Timolol Mal PF 22.3-6.8 MG/ML SOLN Apply 1 drop to eye 2 (two) times daily. Left eye  . latanoprost (XALATAN) 0.005 % ophthalmic solution Place 1 drop into both eyes at bedtime.  . memantine (NAMENDA) 10 MG tablet TAKE 1 TABLET BY MOUTH TWICE A DAY  . nystatin cream (MYCOSTATIN) Apply 1 application topically 2 (two) times daily.  Marland Kitchen triamcinolone ointment (KENALOG) 0.1 % Apply 1 application topically 2 (two) times daily.   No current facility-administered medications on file prior to visit.    Allergies  Allergen Reactions  . Minocycline   . Prednisone Swelling  . Sulfa  Antibiotics     Patient does not remember the type of reaction, happened years ago.  . Zithromax [Azithromycin] Swelling  . Ciprofloxacin Hcl Rash  . Penicillins Rash   Past Medical History:  Diagnosis Date  . Breast cancer ([place])   . Chest tightness   . GERD (gastroesophageal reflux disease)   . Hepatitis C virus screening Negative 08/31/2012 03/08/2018  . Hyperlipidemia   . Hypertension   . Palpitations   .  Prediabetes   . Vitamin D deficiency    Health Maintenance  Topic Date Due  . INFLUENZA VACCINE  01/09/2019  . TETANUS/TDAP  08/28/2022  . COLONOSCOPY  03/30/2023  . DEXA SCAN  Completed  . Hepatitis C Screening  Completed  . PNA vac Low Risk Adult  Completed   Immunization History  Administered Date(s) Administered  . Influenza, High Dose Seasonal PF 03/07/2014, 05/08/2015, 03/11/2017, 03/03/2018  . PPD Test 08/30/2013, 10/04/2014, 10/24/2015  . Pneumococcal Conjugate-13 03/24/2014  . Pneumococcal Polysaccharide-23 09/08/2006, 03/11/2017  . Tdap 08/27/2012  . Zoster 09/03/2012   Last Colon - 04/02/2013 - Dr Watt Climes - recc 5 yr F/U   Last MGM -  Last 10/22/2013 - S/p Bilat Mastectomies 2011  Past Surgical History:  Procedure Laterality Date  . MASTECTOMY  August 2011   Bilateral  . NASAL SINUS SURGERY    . PARATHYROIDECTOMY     Family History  Problem Relation Age of Onset  . Diabetes Mother   . Cancer Mother        breast  . Alzheimer's disease Mother   . Heart attack Father 26  . Heart attack Brother   . Dementia Maternal Grandmother   . Dementia Maternal Grandfather    Social History   Tobacco Use  . Smoking status: Never Smoker  . Smokeless tobacco: Never Used  Substance Use Topics  . Alcohol use: No  . Drug use: Never    ROS Constitutional: Denies fever, chills, weight loss/gain, headaches, insomnia,  night sweats, and change in appetite. Does c/o fatigue. Eyes: Denies redness, blurred vision, diplopia, discharge, itchy, watery eyes.  ENT: Denies discharge, congestion, post nasal drip, epistaxis, sore throat, earache, hearing loss, dental pain, Tinnitus, Vertigo, Sinus pain, snoring.  Cardio: Denies chest pain, palpitations, irregular heartbeat, syncope, dyspnea, diaphoresis, orthopnea, PND, claudication, edema Respiratory: denies cough, dyspnea, DOE, pleurisy, hoarseness, laryngitis, wheezing.  Gastrointestinal: Denies dysphagia, heartburn, reflux, water  brash, pain, cramps, nausea, vomiting, bloating, diarrhea, constipation, hematemesis, melena, hematochezia, jaundice, hemorrhoids Genitourinary: Denies dysuria, frequency, urgency, nocturia, hesitancy, discharge, hematuria, flank pain Breast: Breast lumps, nipple discharge, bleeding.  Musculoskeletal: Denies arthralgia, myalgia, stiffness, Jt. Swelling, pain, limp, and strain/sprain. Denies falls. Skin: Denies puritis, rash, hives, warts, acne, eczema, changing in skin lesion Neuro: No weakness, tremor, incoordination, spasms, paresthesia, pain Psychiatric: Denies confusion, memory loss, sensory loss. Denies Depression. Endocrine: Denies change in weight, skin, hair change, nocturia, and paresthesia, diabetic polys, visual blurring, hyper / hypo glycemic episodes.  Heme/Lymph: No excessive bleeding, bruising, enlarged lymph nodes.  Physical Exam  BP 122/80   Pulse 63   Temp (!) 97.3 F (36.3 C)   Ht 5\' 7"  (1.702 m)   Wt 106 lb 12.8 oz (48.4 kg)   SpO2 99%   BMI 16.73 kg/m   General Appearance: Well nourished, well groomed and in no apparent distress.  Eyes: PERRLA, EOMs, conjunctiva no swelling or erythema, normal fundi and vessels. Sinuses: No frontal/maxillary tenderness ENT/Mouth: EACs patent / TMs  nl. Nares clear without erythema, swelling, mucoid exudates.  Oral hygiene is good. No erythema, swelling, or exudate. Tongue normal, non-obstructing. Tonsils not swollen or erythematous. Hearing normal.  Neck: Supple, thyroid not palpable. No bruits, nodes or JVD. Respiratory: Respiratory effort normal.  BS equal and clear bilateral without rales, rhonci, wheezing or stridor. Cardio: Heart sounds are normal with regular rate and rhythm and no murmurs, rubs or gallops. Peripheral pulses are normal and equal bilaterally without edema. No aortic or femoral bruits. Chest: symmetric with normal excursions and percussion. Breasts:  Surgically absent w/o appreciable palpable residual breast  tissuse. Abdomen: Flat, soft with bowel sounds active. Nontender, no guarding, rebound, hernias, masses, or organomegaly.  Lymphatics: Non tender without lymphadenopathy.  Musculoskeletal: Full ROM all peripheral extremities, joint stability, 5/5 strength, and normal gait. Skin: Warm and dry without rashes, lesions, cyanosis, clubbing or  ecchymosis.  Neuro: Cranial nerves intact, reflexes equal bilaterally. Normal muscle tone, no cerebellar symptoms. Sensation intact.  Pysch: Alert and oriented x 3. Pleasant affect, with concrete & limited Insight and Judgment.   Assessment and Plan  1. Annual Preventative Screening Examination  2. Essential hypertension  - EKG 12-Lead - Urinalysis, Routine w reflex microscopic - Microalbumin / creatinine urine ratio - CBC with Differential/Platelet - COMPLETE METABOLIC PANEL WITH GFR - Magnesium - TSH  3. Hyperlipidemia, mixed  - EKG 12-Lead - Lipid panel - TSH  4. Abnormal glucose  - EKG 12-Lead - Hemoglobin A1c - Insulin, random  5. Vitamin D deficiency  - VITAMIN D 25 Hydroxyl  6. Prediabetes  - EKG 12-Lead - Hemoglobin A1c - Insulin, random  7. Vitamin B12 deficiency  - Vitamin B12 - CBC with Differential/Platelet  8. Hyperparathyroidism, primary ([place])  - PTH, intact and calcium  9. Vascular dementia without behavioral disturbance ([place])   10. Gastroesophageal reflux disease  - CBC with Differential/Platelet  11. Screening for colorectal cancer  - POC Hemoccult Bld/Stl  12. Screening for ischemic heart disease  - EKG 12-Lead  13. Screening for AAA (aortic abdominal aneurysm)  - EKG 12-Lead  14. FHx: heart disease  - EKG 12-Lead  15. Medication management  - Urinalysis, Routine w reflex microscopic - Microalbumin / creatinine urine ratio - CBC with Differential/Platelet - COMPLETE METABOLIC PANEL WITH GFR - Magnesium - Lipid panel - TSH - Hemoglobin A1c - Insulin, random - VITAMIN D 25  Hydroxyl        Patient was counseled in prudent diet to achieve/maintain BMI less than 25 for weight control, BP monitoring, regular exercise and medications. Discussed med's effects and SE's. Screening labs and tests as requested with regular follow-up as recommended. Over 40 minutes of exam, counseling, chart review and high complex critical decision making was performed.   Kirtland Bouchard, MD

## 2018-12-31 ENCOUNTER — Ambulatory Visit (INDEPENDENT_AMBULATORY_CARE_PROVIDER_SITE_OTHER): Payer: Medicare HMO | Admitting: Internal Medicine

## 2018-12-31 ENCOUNTER — Encounter: Payer: Self-pay | Admitting: Internal Medicine

## 2018-12-31 ENCOUNTER — Other Ambulatory Visit: Payer: Self-pay

## 2018-12-31 VITALS — BP 122/80 | HR 63 | Temp 97.3°F | Ht 67.0 in | Wt 106.8 lb

## 2018-12-31 DIAGNOSIS — E538 Deficiency of other specified B group vitamins: Secondary | ICD-10-CM

## 2018-12-31 DIAGNOSIS — Z8249 Family history of ischemic heart disease and other diseases of the circulatory system: Secondary | ICD-10-CM | POA: Diagnosis not present

## 2018-12-31 DIAGNOSIS — E782 Mixed hyperlipidemia: Secondary | ICD-10-CM

## 2018-12-31 DIAGNOSIS — Z0001 Encounter for general adult medical examination with abnormal findings: Secondary | ICD-10-CM

## 2018-12-31 DIAGNOSIS — Z136 Encounter for screening for cardiovascular disorders: Secondary | ICD-10-CM | POA: Diagnosis not present

## 2018-12-31 DIAGNOSIS — R7309 Other abnormal glucose: Secondary | ICD-10-CM

## 2018-12-31 DIAGNOSIS — Z79899 Other long term (current) drug therapy: Secondary | ICD-10-CM

## 2018-12-31 DIAGNOSIS — R7303 Prediabetes: Secondary | ICD-10-CM | POA: Diagnosis not present

## 2018-12-31 DIAGNOSIS — Z1211 Encounter for screening for malignant neoplasm of colon: Secondary | ICD-10-CM

## 2018-12-31 DIAGNOSIS — K219 Gastro-esophageal reflux disease without esophagitis: Secondary | ICD-10-CM | POA: Diagnosis not present

## 2018-12-31 DIAGNOSIS — I10 Essential (primary) hypertension: Secondary | ICD-10-CM

## 2018-12-31 DIAGNOSIS — E210 Primary hyperparathyroidism: Secondary | ICD-10-CM | POA: Diagnosis not present

## 2018-12-31 DIAGNOSIS — F0150 Vascular dementia without behavioral disturbance: Secondary | ICD-10-CM

## 2018-12-31 DIAGNOSIS — E559 Vitamin D deficiency, unspecified: Secondary | ICD-10-CM

## 2018-12-31 DIAGNOSIS — Z0000 Encounter for general adult medical examination without abnormal findings: Secondary | ICD-10-CM

## 2019-01-01 LAB — COMPLETE METABOLIC PANEL WITH GFR
AG Ratio: 1.7 (calc) (ref 1.0–2.5)
ALT: 21 U/L (ref 6–29)
AST: 26 U/L (ref 10–35)
Albumin: 4.5 g/dL (ref 3.6–5.1)
Alkaline phosphatase (APISO): 91 U/L (ref 37–153)
BUN: 14 mg/dL (ref 7–25)
CO2: 28 mmol/L (ref 20–32)
Calcium: 10.8 mg/dL — ABNORMAL HIGH (ref 8.6–10.4)
Chloride: 105 mmol/L (ref 98–110)
Creat: 0.92 mg/dL (ref 0.60–0.93)
GFR, Est African American: 73 mL/min/{1.73_m2} (ref >=60)
GFR, Est Non African American: 63 mL/min/{1.73_m2} (ref >=60)
Globulin: 2.6 g/dL (calc) (ref 1.9–3.7)
Glucose, Bld: 100 mg/dL — ABNORMAL HIGH (ref 65–99)
Potassium: 4.5 mmol/L (ref 3.5–5.3)
Sodium: 141 mmol/L (ref 135–146)
Total Bilirubin: 1.5 mg/dL — ABNORMAL HIGH (ref 0.2–1.2)
Total Protein: 7.1 g/dL (ref 6.1–8.1)

## 2019-01-01 LAB — CBC WITH DIFFERENTIAL/PLATELET
Absolute Monocytes: 275 cells/uL (ref 200–950)
Basophils Absolute: 19 cells/uL (ref 0–200)
Basophils Relative: 0.6 %
Eosinophils Absolute: 38 cells/uL (ref 15–500)
Eosinophils Relative: 1.2 %
HCT: 45.8 % — ABNORMAL HIGH (ref 35.0–45.0)
Hemoglobin: 15.3 g/dL (ref 11.7–15.5)
Lymphs Abs: 848 cells/uL — ABNORMAL LOW (ref 850–3900)
MCH: 31.7 pg (ref 27.0–33.0)
MCHC: 33.4 g/dL (ref 32.0–36.0)
MCV: 95 fL (ref 80.0–100.0)
MPV: 9.8 fL (ref 7.5–12.5)
Monocytes Relative: 8.6 %
Neutro Abs: 2019 cells/uL (ref 1500–7800)
Neutrophils Relative %: 63.1 %
Platelets: 179 10*3/uL (ref 140–400)
RBC: 4.82 10*6/uL (ref 3.80–5.10)
RDW: 11.8 % (ref 11.0–15.0)
Total Lymphocyte: 26.5 %
WBC: 3.2 10*3/uL — ABNORMAL LOW (ref 3.8–10.8)

## 2019-01-01 LAB — HEMOGLOBIN A1C
Hgb A1c MFr Bld: 5.7 % of total Hgb — ABNORMAL HIGH (ref <5.7)
Mean Plasma Glucose: 117 (calc)
eAG (mmol/L): 6.5 (calc)

## 2019-01-01 LAB — PTH, INTACT AND CALCIUM
Calcium: 10.8 mg/dL — ABNORMAL HIGH (ref 8.6–10.4)
PTH: 61 pg/mL (ref 14–64)

## 2019-01-01 LAB — LIPID PANEL
Cholesterol: 147 mg/dL (ref <200)
HDL: 69 mg/dL (ref >=50)
LDL Cholesterol (Calc): 64 mg/dL (calc)
Non-HDL Cholesterol (Calc): 78 mg/dL (calc) (ref <130)
Total CHOL/HDL Ratio: 2.1 (calc) (ref <5.0)
Triglycerides: 65 mg/dL (ref <150)

## 2019-01-01 LAB — TSH: TSH: 0.97 mIU/L (ref 0.40–4.50)

## 2019-01-01 LAB — VITAMIN D 25 HYDROXY (VIT D DEFICIENCY, FRACTURES): Vit D, 25-Hydroxy: 36 ng/mL (ref 30–100)

## 2019-01-01 LAB — INSULIN, RANDOM: Insulin: 7.8 u[IU]/mL

## 2019-01-01 LAB — VITAMIN B12: Vitamin B-12: 661 pg/mL (ref 200–1100)

## 2019-01-01 LAB — MAGNESIUM: Magnesium: 2.1 mg/dL (ref 1.5–2.5)

## 2019-01-02 ENCOUNTER — Encounter: Payer: Self-pay | Admitting: Internal Medicine

## 2019-01-04 ENCOUNTER — Encounter: Payer: Self-pay | Admitting: *Deleted

## 2019-01-09 ENCOUNTER — Other Ambulatory Visit: Payer: Self-pay | Admitting: Adult Health

## 2019-02-02 ENCOUNTER — Other Ambulatory Visit: Payer: Self-pay | Admitting: Neurology

## 2019-02-02 DIAGNOSIS — F0280 Dementia in other diseases classified elsewhere without behavioral disturbance: Secondary | ICD-10-CM

## 2019-02-02 DIAGNOSIS — G301 Alzheimer's disease with late onset: Secondary | ICD-10-CM

## 2019-02-23 ENCOUNTER — Other Ambulatory Visit: Payer: Self-pay

## 2019-02-23 ENCOUNTER — Ambulatory Visit: Payer: Medicare HMO | Admitting: Adult Health

## 2019-02-23 ENCOUNTER — Encounter: Payer: Self-pay | Admitting: Adult Health

## 2019-02-23 ENCOUNTER — Ambulatory Visit: Payer: Self-pay | Admitting: Neurology

## 2019-02-23 VITALS — BP 138/87 | HR 65 | Temp 97.7°F | Ht 67.0 in | Wt 111.2 lb

## 2019-02-23 DIAGNOSIS — R69 Illness, unspecified: Secondary | ICD-10-CM | POA: Diagnosis not present

## 2019-02-23 DIAGNOSIS — G301 Alzheimer's disease with late onset: Secondary | ICD-10-CM | POA: Diagnosis not present

## 2019-02-23 DIAGNOSIS — F0280 Dementia in other diseases classified elsewhere without behavioral disturbance: Secondary | ICD-10-CM
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
## 2019-02-23 NOTE — Patient Instructions (Signed)
Your Plan:  Continue Aricept and Nameda If your symptoms worsen or you develop new symptoms please let us know.    Thank you for coming to see [place] at [facility]. I hope we have been able to provide you high quality care today.  You may receive a patient satisfaction survey over the next few weeks. We would appreciate your feedback and comments so that we may continue to improve ourselves and the health of our patients.

## 2019-03-02 ENCOUNTER — Ambulatory Visit: Payer: Self-pay | Admitting: Adult Health

## 2019-03-04 DIAGNOSIS — H402223 Chronic angle-closure glaucoma, left eye, severe stage: Secondary | ICD-10-CM | POA: Diagnosis not present

## 2019-03-04 DIAGNOSIS — H401111 Primary open-angle glaucoma, right eye, mild stage: Secondary | ICD-10-CM | POA: Diagnosis not present

## 2019-03-04 DIAGNOSIS — H543 Unqualified visual loss, both eyes: Secondary | ICD-10-CM | POA: Diagnosis not present

## 2019-03-09 DIAGNOSIS — H401111 Primary open-angle glaucoma, right eye, mild stage: Secondary | ICD-10-CM | POA: Diagnosis not present

## 2019-03-09 DIAGNOSIS — H402223 Chronic angle-closure glaucoma, left eye, severe stage: Secondary | ICD-10-CM | POA: Diagnosis not present

## 2019-04-04 NOTE — Patient Instructions (Addendum)
You received your influenza HD vaccination today.  Your arm will be sore, there could be mild redness to the area for 3 days and you could elevated temperature 99.  This is your bodies normal reaction to an immunization. Should you develop a rash, increasing redness of injection site after 3 days or temperature 100.4 or greater please let us know.   You are due for DEXA scan.  HOW TO SCHEDULE Bone Density, DEXA  [facility]  [place] [address]  7 a.m.-6:30 p.m., Monday 7 a.m.-5 p.m., Tuesday-Friday Schedule an appointment by calling [phone].  There are other locations to get your bone density, DEXA scan.  If you have a preference please let our office know.   For your allergies try one of the following antihistamines:  Claritin, Zyrtec or Xyzal, Allegra.  Zyrtec and Xyzal are similar and can be taken at night as they can cause sleepiness.  Claritin and Allegra are typically non-drowsy and can be taken durring the day.     Vit D  & Vit C 1,000 mg   are recommended to help protect  against the Covid-19 and other Corona viruses.    Also it's recommended  to take  Zinc 50 mg  to help  protect against the Covid-19   and best place to get  is also on [employer].com  and don't pay more than 6-8 cents /pill !  ================================ Coronavirus (COVID-19) Are you at risk?  Are you at risk for the Coronavirus (COVID-19)?  To be considered HIGH RISK for Coronavirus (COVID-19), you have to meet the following criteria:  . Traveled to [place], [place], [place], [place] or [place]; or in the [place] to [place], [place], [place]  . or [place]; and have fever, cough, and shortness of breath within the last 2 weeks of travel OR . Been in close contact with a person diagnosed with COVID-19 within the last 2 weeks and have  . fever, cough,and shortness of breath .  . IF YOU DO NOT MEET THESE  CRITERIA, YOU ARE CONSIDERED LOW RISK FOR COVID-19.  What to do if you are HIGH RISK for COVID-19?  Marland Kitchen If you are having a medical emergency, call 911. . Seek medical care right away. Before you go to a doctor's office, urgent care or emergency department, .  call ahead and tell them about your recent travel, contact with someone diagnosed with COVID-19  .  and your symptoms.  . You should receive instructions from your physician's office regarding next steps of care.  . When you arrive at healthcare provider, tell the healthcare staff immediately you have returned from  . visiting [place], [place], [place], [place] or [place]; or traveled in the [place] to [place], [place],  . [place] or [place] in the last two weeks or you have been in close contact with a person diagnosed with  . COVID-19 in the last 2 weeks.   . Tell the health care staff about your symptoms: fever, cough and shortness of breath. . After you have been seen by a medical provider, you will be either: o Tested for (COVID-19) and discharged home on quarantine except to seek medical care if  o symptoms worsen, and asked to  - Stay home and avoid contact with others until you get your results (4-5 days)  - Avoid travel on public transportation if possible (such as bus, train,  or airplane) or o Sent to the Emergency Department by EMS for evaluation, COVID-19 testing  and  o possible admission depending on your condition and test results.  What to do if you are LOW RISK for COVID-19?  Reduce your risk of any infection by using the same precautions used for avoiding the common cold or flu:  Marland Kitchen Wash your hands often with soap and warm water for at least 20 seconds.  If soap and water are not readily available,  . use an alcohol-based hand sanitizer with at least 60% alcohol.  . If coughing or sneezing, cover your mouth and nose by coughing or sneezing into the elbow areas of your shirt or coat, .  into a tissue or  into your sleeve (not your hands). . Avoid shaking hands with others and consider head nods or verbal greetings only. . Avoid touching your eyes, nose, or mouth with unwashed hands.  . Avoid close contact with people who are sick. . Avoid places or events with large numbers of people in one location, like concerts or sporting events. . Carefully consider travel plans you have or are making. . If you are planning any travel outside or inside the [place], visit the CDC's Travelers' Health webpage for the latest health notices. . If you have some symptoms but not all symptoms, continue to monitor at home and seek medical attention  . if your symptoms worsen. . If you are having a medical emergency, call 911.   . >>>>>>>>>>>>>>>>>>>>>>>>>>>>>>>>> . We Do NOT Approve of  [facility], [employer]  Our Patients  To Do Home Visits & We Do NOT Approve of [employer] > > > > > > > > > > > > > > > > > > > > > > > > > > > > > > > > > > > > > > >  Preventive Care for Adults  A healthy lifestyle and preventive care can promote health and wellness. Preventive health guidelines for women include the following key practices.  A routine yearly physical is a good way to check with your health care provider about your health and preventive screening. It is a chance to share any concerns and updates on your health and to receive a thorough exam.  Visit your dentist for a routine exam and preventive care every 6 months. Brush your teeth twice a day and floss once a day. Good oral hygiene prevents tooth decay and gum disease.  The frequency of eye exams is based on your age, health, family medical history, use of contact lenses, and other factors. Follow your health care provider's recommendations for frequency of eye exams.  Eat a healthy diet. Foods like vegetables, fruits, whole grains, low-fat dairy products, and lean protein foods contain the nutrients you need without too many  calories. Decrease your intake of foods high in solid fats, added sugars, and salt. Eat the right amount of calories for you. Get information about a proper diet from your health care provider, if necessary.  Regular physical exercise is one of the most important things you can do for your health. Most adults should get at least 150 minutes of moderate-intensity exercise (any activity that increases your heart rate and causes you to sweat) each week. In addition, most adults need muscle-strengthening exercises on 2 or more days a week.  Maintain a healthy weight. The body mass index (BMI) is a screening tool to identify possible weight problems. It provides an estimate of  body fat based on height and weight. Your health care provider can find your BMI and can help you achieve or maintain a healthy weight. For adults 20 years and older:  A BMI below 18.5 is considered underweight.  A BMI of 18.5 to 24.9 is normal.  A BMI of 25 to 29.9 is considered overweight.  A BMI of 30 and above is considered obese.  Maintain normal blood lipids and cholesterol levels by exercising and minimizing your intake of saturated fat. Eat a balanced diet with plenty of fruit and vegetables. If your lipid or cholesterol levels are high, you are over 50, or you are at high risk for heart disease, you may need your cholesterol levels checked more frequently. Ongoing high lipid and cholesterol levels should be treated with medicines if diet and exercise are not working.  If you smoke, find out from your health care provider how to quit. If you do not use tobacco, do not start.  Lung cancer screening is recommended for adults aged 60-80 years who are at high risk for developing lung cancer because of a history of smoking. A yearly low-dose CT scan of the lungs is recommended for people who have at least a 30-pack-year history of smoking and are a current smoker or have quit within the past 15 years. A pack year of smoking is  smoking an average of 1 pack of cigarettes a day for 1 year (for example: 1 pack a day for 30 years or 2 packs a day for 15 years). Yearly screening should continue until the smoker has stopped smoking for at least 15 years. Yearly screening should be stopped for people who develop a health problem that would prevent them from having lung cancer treatment.  Avoid use of street drugs. Do not share needles with anyone. Ask for help if you need support or instructions about stopping the use of drugs.  High blood pressure causes heart disease and increases the risk of stroke.  Ongoing high blood pressure should be treated with medicines if weight loss and exercise do not work.  If you are 1-69 years old, ask your health care provider if you should take aspirin to prevent strokes.  Diabetes screening involves taking a blood sample to check your fasting blood sugar level. This should be done once every 3 years, after age 19, if you are within normal weight and without risk factors for diabetes. Testing should be considered at a younger age or be carried out more frequently if you are overweight and have at least 1 risk factor for diabetes.  Breast cancer screening is essential preventive care for women. You should practice "breast self-awareness." This means understanding the normal appearance and feel of your breasts and may include breast self-examination. Any changes detected, no matter how small, should be reported to a health care provider. Women in their 49s and 30s should have a clinical breast exam (CBE) by a health care provider as part of a regular health exam every 1 to 3 years. After age 64, women should have a CBE every year. Starting at age 70, women should consider having a mammogram (breast X-ray test) every year. Women who have a family history of breast cancer should talk to their health care provider about genetic screening. Women at a high risk of breast cancer should talk to their health  care providers about having an MRI and a mammogram every year.  Breast cancer gene (BRCA)-related cancer risk assessment is recommended for women who  have family members with BRCA-related cancers. BRCA-related cancers include breast, ovarian, tubal, and peritoneal cancers. Having family members with these cancers may be associated with an increased risk for harmful changes (mutations) in the breast cancer genes BRCA1 and BRCA2. Results of the assessment will determine the need for genetic counseling and BRCA1 and BRCA2 testing.  Routine pelvic exams to screen for cancer are no longer recommended for nonpregnant women who are considered low risk for cancer of the pelvic organs (ovaries, uterus, and vagina) and who do not have symptoms. Ask your health care provider if a screening pelvic exam is right for you.  If you have had past treatment for cervical cancer or a condition that could lead to cancer, you need Pap tests and screening for cancer for at least 20 years after your treatment. If Pap tests have been discontinued, your risk factors (such as having a new sexual partner) need to be reassessed to determine if screening should be resumed. Some women have medical problems that increase the chance of getting cervical cancer. In these cases, your health care provider may recommend more frequent screening and Pap tests.    Colorectal cancer can be detected and often prevented. Most routine colorectal cancer screening begins at the age of 43 years and continues through age 51 years. However, your health care provider may recommend screening at an earlier age if you have risk factors for colon cancer. On a yearly basis, your health care provider may provide home test kits to check for hidden blood in the stool. Use of a small camera at the end of a tube, to directly examine the colon (sigmoidoscopy or colonoscopy), can detect the earliest forms of colorectal cancer. Talk to your health care provider about  this at age 55, when routine screening begins.  Direct exam of the colon should be repeated every 5-10 years through age 47 years, unless early forms of pre-cancerous polyps or small growths are found.  Osteoporosis is a disease in which the bones lose minerals and strength with aging. This can result in serious bone fractures or breaks. The risk of osteoporosis can be identified using a bone density scan. Women ages 11 years and over and women at risk for fractures or osteoporosis should discuss screening with their health care providers. Ask your health care provider whether you should take a calcium supplement or vitamin D to reduce the rate of osteoporosis.  Menopause can be associated with physical symptoms and risks. Hormone replacement therapy is available to decrease symptoms and risks. You should talk to your health care provider about whether hormone replacement therapy is right for you.  Use sunscreen. Apply sunscreen liberally and repeatedly throughout the day. You should seek shade when your shadow is shorter than you. Protect yourself by wearing long sleeves, pants, a wide-brimmed hat, and sunglasses year round, whenever you are outdoors.  Once a month, do a whole body skin exam, using a mirror to look at the skin on your back. Tell your health care provider of new moles, moles that have irregular borders, moles that are larger than a pencil eraser, or moles that have changed in shape or color.  Stay current with required vaccines (immunizations).  Influenza vaccine. All adults should be immunized every year.  Tetanus, diphtheria, and acellular pertussis (Td, Tdap) vaccine. Pregnant women should receive 1 dose of Tdap vaccine during each pregnancy. The dose should be obtained regardless of the length of time since the last dose. Immunization is preferred during the  27th-36th week of gestation. An adult who has not previously received Tdap or who does not know her vaccine status should  receive 1 dose of Tdap. This initial dose should be followed by tetanus and diphtheria toxoids (Td) booster doses every 10 years. Adults with an unknown or incomplete history of completing a 3-dose immunization series with Td-containing vaccines should begin or complete a primary immunization series including a Tdap dose. Adults should receive a Td booster every 10 years.    Zoster vaccine. One dose is recommended for adults aged 35 years or older unless certain conditions are present.    Pneumococcal 13-valent conjugate (PCV13) vaccine. When indicated, a person who is uncertain of her immunization history and has no record of immunization should receive the PCV13 vaccine. An adult aged 67 years or older who has certain medical conditions and has not been previously immunized should receive 1 dose of PCV13 vaccine. This PCV13 should be followed with a dose of pneumococcal polysaccharide (PPSV23) vaccine. The PPSV23 vaccine dose should be obtained at least 1 or more year(s) after the dose of PCV13 vaccine. An adult aged 31 years or older who has certain medical conditions and previously received 1 or more doses of PPSV23 vaccine should receive 1 dose of PCV13. The PCV13 vaccine dose should be obtained 1 or more years after the last PPSV23 vaccine dose.    Pneumococcal polysaccharide (PPSV23) vaccine. When PCV13 is also indicated, PCV13 should be obtained first. All adults aged 51 years and older should be immunized. An adult younger than age 68 years who has certain medical conditions should be immunized. Any person who resides in a nursing home or long-term care facility should be immunized. An adult smoker should be immunized. People with an immunocompromised condition and certain other conditions should receive both PCV13 and PPSV23 vaccines. People with human immunodeficiency virus (HIV) infection should be immunized as soon as possible after diagnosis. Immunization during chemotherapy or radiation  therapy should be avoided. Routine use of PPSV23 vaccine is not recommended for American Indians, [place] Natives, or people younger than 65 years unless there are medical conditions that require PPSV23 vaccine. When indicated, people who have unknown immunization and have no record of immunization should receive PPSV23 vaccine. One-time revaccination 5 years after the first dose of PPSV23 is recommended for people aged 19-64 years who have chronic kidney failure, nephrotic syndrome, asplenia, or immunocompromised conditions. People who received 1-2 doses of PPSV23 before age 35 years should receive another dose of PPSV23 vaccine at age 77 years or later if at least 5 years have passed since the previous dose. Doses of PPSV23 are not needed for people immunized with PPSV23 at or after age 69 years.   Preventive Services / Frequency  Ages 39 years and over  Blood pressure check.  Lipid and cholesterol check.  Lung cancer screening. / Every year if you are aged 47-80 years and have a 30-pack-year history of smoking and currently smoke or have quit within the past 15 years. Yearly screening is stopped once you have quit smoking for at least 15 years or develop a health problem that would prevent you from having lung cancer treatment.  Clinical breast exam.** / Every year after age 84 years.   BRCA-related cancer risk assessment.** / For women who have family members with a BRCA-related cancer (breast, ovarian, tubal, or peritoneal cancers).  Mammogram.** / Every year beginning at age 51 years and continuing for as long as you are in good health. Consult  with your health care provider.  Pap test.** / Every 3 years starting at age 33 years through age 53 or 37 years with 3 consecutive normal Pap tests. Testing can be stopped between 65 and 70 years with 3 consecutive normal Pap tests and no abnormal Pap or HPV tests in the past 10 years.  Fecal occult blood test (FOBT) of stool. / Every year beginning  at age 32 years and continuing until age 59 years. You may not need to do this test if you get a colonoscopy every 10 years.  Flexible sigmoidoscopy or colonoscopy.** / Every 5 years for a flexible sigmoidoscopy or every 10 years for a colonoscopy beginning at age 61 years and continuing until age 29 years.  Hepatitis C blood test.** / For all people born from [birth year] through 1963 and any individual with known risks for hepatitis C.  Osteoporosis screening.** / A one-time screening for women ages 48 years and over and women at risk for fractures or osteoporosis.  Skin self-exam. / Monthly.  Influenza vaccine. / Every year.  Tetanus, diphtheria, and acellular pertussis (Tdap/Td) vaccine.** / 1 dose of Td every 10 years.  Zoster vaccine.** / 1 dose for adults aged 47 years or older.  Pneumococcal 13-valent conjugate (PCV13) vaccine.** / Consult your health care provider.  Pneumococcal polysaccharide (PPSV23) vaccine.** / 1 dose for all adults aged 41 years and older. Screening for abdominal aortic aneurysm (AAA)  by ultrasound is recommended for people who have history of high blood pressure or who are current or former smokers. ++++++++++++++++++++ Recommend Adult Low Dose Aspirin or  coated  Aspirin 81 mg daily  To reduce risk of Colon Cancer 40 %,  Skin Cancer 26 % ,  Melanoma 46%  and  Pancreatic cancer 60% ++++++++++++++++++++ Vitamin D goal  is between 70-100.  Please make sure that you are taking your Vitamin D as directed.  It is very important as a natural anti-inflammatory  helping hair, skin, and nails, as well as reducing stroke and heart attack risk.  It helps your bones and helps with mood. It also decreases numerous cancer risks so please take it as directed.  Low Vit D is associated with a 200-300% higher risk for CANCER  and 200-300% higher risk for HEART   ATTACK  &  STROKE.   .....................................Marland Kitchen It is also associated with higher death rate at  younger ages,  autoimmune diseases like Rheumatoid arthritis, Lupus, Multiple Sclerosis.    Also many other serious conditions, like depression, Alzheimer's Dementia, infertility, muscle aches, fatigue, fibromyalgia - just to name a few. ++++++++++++++++++ Recommend the book "The END of DIETING" by Dr Excell Seltzer  & the book "The END of DIABETES " by Dr Excell Seltzer At [email or web address] - get book & Audio CD's    Being diabetic has a  300% increased risk for heart attack, stroke, cancer, and alzheimer- type vascular dementia. It is very important that you work harder with diet by avoiding all foods that are white. Avoid white rice (brown & wild rice is OK), white potatoes (sweetpotatoes in moderation is OK), White bread or wheat bread or anything made out of white flour like bagels, donuts, rolls, buns, biscuits, cakes, pastries, cookies, pizza crust, and pasta (made from white flour & egg whites) - vegetarian pasta or spinach or wheat pasta is OK. Multigrain breads like Arnold's or [employer], or multigrain sandwich thins or flatbreads.  Diet, exercise and weight loss can reverse and cure diabetes in the early  stages.  Diet, exercise and weight loss is very important in the control and prevention of complications of diabetes which affects every system in your body, ie. Brain - dementia/stroke, eyes - glaucoma/blindness, heart - heart attack/heart failure, kidneys - dialysis, stomach - gastric paralysis, intestines - malabsorption, nerves - severe painful neuritis, circulation - gangrene & loss of a leg(s), and finally cancer and Alzheimers.    I recommend avoid fried & greasy foods,  sweets/candy, white rice (brown or wild rice or Quinoa is OK), white potatoes (sweet potatoes are OK) - anything made from white flour - bagels, doughnuts, rolls, buns, biscuits,white and wheat breads, pizza crust and traditional pasta made of white flour & egg white(vegetarian pasta or spinach or wheat pasta is OK).   Multi-grain bread is OK - like multi-grain flat bread or sandwich thins. Avoid alcohol in excess. Exercise is also important.    Eat all the vegetables you want - avoid meat, especially red meat and dairy - especially cheese.  Cheese is the most concentrated form of trans-fats which is the worst thing to clog up our arteries. Veggie cheese is OK which can be found in the fresh produce section at [employer] or [employer] or [employer]  +++++++++++++++++++ DASH Eating Plan  DASH stands for "Dietary Approaches to Stop Hypertension."   The DASH eating plan is a healthy eating plan that has been shown to reduce high blood pressure (hypertension). Additional health benefits may include reducing the risk of type 2 diabetes mellitus, heart disease, and stroke. The DASH eating plan may also help with weight loss. WHAT DO I NEED TO KNOW ABOUT THE DASH EATING PLAN? For the DASH eating plan, you will follow these general guidelines:  Choose foods with a percent daily value for sodium of less than 5% (as listed on the food label).  Use salt-free seasonings or herbs instead of table salt or sea salt.  Check with your health care provider or pharmacist before using salt substitutes.  Eat lower-sodium products, often labeled as "lower sodium" or "no salt added."  Eat fresh foods.  Eat more vegetables, fruits, and low-fat dairy products.  Choose whole grains. Look for the word "whole" as the first word in the ingredient list.  Choose fish   Limit sweets, desserts, sugars, and sugary drinks.  Choose heart-healthy fats.  Eat veggie cheese   Eat more home-cooked food and less restaurant, buffet, and fast food.  Limit fried foods.  Cook foods using methods other than frying.  Limit canned vegetables. If you do use them, rinse them well to decrease the sodium.  When eating at a restaurant, ask that your food be prepared with less salt, or no salt if possible.                      WHAT FOODS  CAN I EAT? Read Dr Fara Olden Fuhrman's books on The End of Dieting & The End of Diabetes  Grains Whole grain or whole wheat bread. Brown rice. Whole grain or whole wheat pasta. Quinoa, bulgur, and whole grain cereals. Low-sodium cereals. Corn or whole wheat flour tortillas. Whole grain cornbread. Whole grain crackers. Low-sodium crackers.  Vegetables Fresh or frozen vegetables (raw, steamed, roasted, or grilled). Low-sodium or reduced-sodium tomato and vegetable juices. Low-sodium or reduced-sodium tomato sauce and paste. Low-sodium or reduced-sodium canned vegetables.   Fruits All fresh, canned (in natural juice), or frozen fruits.  Protein Products  All fish and seafood.  Dried beans, peas, or lentils.  Unsalted nuts and seeds. Unsalted canned beans.  Dairy Low-fat dairy products, such as skim or 1% milk, 2% or reduced-fat cheeses, low-fat ricotta or cottage cheese, or plain low-fat yogurt. Low-sodium or reduced-sodium cheeses.  Fats and Oils Tub margarines without trans fats. Light or reduced-fat mayonnaise and salad dressings (reduced sodium). Avocado. Safflower, olive, or canola oils. Natural peanut or almond butter.  Other Unsalted popcorn and pretzels. The items listed above may not be a complete list of recommended foods or beverages. Contact your dietitian for more options.  +++++++++++++++  WHAT FOODS ARE NOT RECOMMENDED? Grains/ White flour or wheat flour White bread. White pasta. White rice. Refined cornbread. Bagels and croissants. Crackers that contain trans fat.  Vegetables  Creamed or fried vegetables. Vegetables in a . Regular canned vegetables. Regular canned tomato sauce and paste. Regular tomato and vegetable juices.  Fruits Dried fruits. Canned fruit in light or heavy syrup. Fruit juice.  Meat and Other Protein Products Meat in general - RED meat & White meat.  Fatty cuts of meat. Ribs, chicken wings, all processed meats as bacon, sausage, bologna, salami,  fatback, hot dogs, bratwurst and packaged luncheon meats.  Dairy Whole or 2% milk, cream, half-and-half, and cream cheese. Whole-fat or sweetened yogurt. Full-fat cheeses or blue cheese. Non-dairy creamers and whipped toppings. Processed cheese, cheese spreads, or cheese curds.  Condiments Onion and garlic salt, seasoned salt, table salt, and sea salt. Canned and packaged gravies. Worcestershire sauce. Tartar sauce. Barbecue sauce. Teriyaki sauce. Soy sauce, including reduced sodium. Steak sauce. Fish sauce. Oyster sauce. Cocktail sauce. Horseradish. Ketchup and mustard. Meat flavorings and tenderizers. Bouillon cubes. Hot sauce. Tabasco sauce. Marinades. Taco seasonings. Relishes.  Fats and Oils Butter, stick margarine, lard, shortening and bacon fat. Coconut, palm kernel, or palm oils. Regular salad dressings.  Pickles and olives. Salted popcorn and pretzels.  The items listed above may not be a complete list of foods and beverages to avoid.

## 2019-04-04 NOTE — Progress Notes (Signed)
3 Month Follow Up   Assessment and Plan:    Kambria was seen today for follow-up.  Diagnoses and all orders for this visit:  Essential hypertension Continue current medications: Monitor blood pressure at home; call if consistently over 130/80 Continue DASH diet.   Reminder to go to the ER if any CP, SOB, nausea, dizziness, severe HA, changes vision/speech, left arm numbness and tingling and jaw pain. -     CBC with Differential/Platelet -     COMPLETE METABOLIC PANEL WITH GFR -     TSH  Hyperlipidemia, mixed Continue medications: Discussed dietary and exercise modifications Low fat diet -     Lipid panel  Gastroesophageal reflux disease, unspecified whether esophagitis present Doing well at this time Continue:  Diet discussed Monitor for triggers Avoid food with high acid content Avoid excessive cafeine Increase water intake -     Magnesium  Vitamin D deficiency Continue supplementation -     VITAMIN D 25 Hydroxy (Vit-D Deficiency)  Hyperparathyroidism, primary ([place]) Follows with endocrinology, Dr Georgina Peer  Iron deficiency Not taking supplementation at this time Will continue to monitor  Mild malnutrition ([place]) Body mass index (BMI) less than 19 Discussed Ensure between meals  Vascular dementia without behavioral disturbance (HCC) Taking nemenda daily Husband provides support  Medication management Continued -     CBC with Differential/Platelet -     COMPLETE METABOLIC PANEL WITH GFR -     Lipid panel -     TSH -     VITAMIN D 25 Hydroxy (Vit-D Deficiency, Fractures) -     Magnesium  Abnormal glucose Discussed dietary and exercise modification -     Hemoglobin A1c  Frequency of urination -     Urinalysis w microscopic + reflex cultur  Osteopenia of hip, unspecified laterality Taking calcium and Vit D supplementation -     DG Bone Density; Future  Need for influenza vaccination -     Flu vaccine HIGH DOSE PF Received today  Other orders -      REFLEXIVE URINE CULTURE    Continue diet and meds as discussed. Further disposition pending results of labs. Discussed med's effects and SE's.  Patient agrees with plan of care and opportunity to ask questions/voice concerns. Over 30 minutes of chart review, interview, exam, counseling, and critical decision making was performed.   Future Appointments  Date Time Provider [place]  06/10/2019 10:00 AM Edgardo Roys, PsyD [facility] CPR  07/07/2019 10:30 AM Unk Pinto, MD [facility] None  08/25/2019  2:30 PM Ward Givens, NP [facility] None  01/07/2020  9:00 AM Unk Pinto, MD [facility] None    ----------------------------------------------------------------------------------------------------------------------  HPI 73 y.o. female  presents for 3 month follow up on HTN, HLD, Hypothyroidism, history of pre-diabetes, weight and vitamin D deficiency.  She has history of dementia and is a poor historian regarding her health or making decisions.  She is pleasant and reports she is doing well. She is taking nemenda  She is accompanied by her husband today.  He does not have any health or medication concerns today.  BMI is Body mass index is 17.1 kg/m., she has been working on diet and exercise.  She has been drinking ensure supplementation between meals. Wt Readings from Last 3 Encounters:  04/06/19 109 lb 3.2 oz (49.5 kg)  02/23/19 111 lb 3.2 oz (50.4 kg)  12/31/18 106 lb 12.8 oz (48.4 kg)    Her blood pressure has been controlled at home, today their BP is BP:  122/74  She does not workout. She denies any cardiac symptoms, chest pains, palpitations, shortness of breath, dizziness or lower extremity edema.     She is on cholesterol medication Rosuvastatin and denies myalgias. Her cholesterol is at goal. The cholesterol last visit was:   Lab Results  Component Value Date   CHOL 147 12/31/2018   HDL 69 12/31/2018   LDLCALC 64 12/31/2018   TRIG 65 12/31/2018    CHOLHDL 2.1 12/31/2018    She has not been working on diet and exercise for prediabetes, and denies polydipsia, polyuria, visual disturbances, vomiting and weight loss. Last A1C in the office was:  Lab Results  Component Value Date   HGBA1C 5.7 (H) 12/31/2018   Patient is on Vitamin D supplement.   Lab Results  Component Value Date   VD25OH 36 12/31/2018       Current Medications:  Current Outpatient Medications on File Prior to Visit  Medication Sig  . aspirin 81 MG tablet Take 81 mg by mouth daily.   Marland Kitchen atenolol (TENORMIN) 100 MG tablet TAKE 1 TABLET DAILY FOR BLOOD PRESSURE  . donepezil (ARICEPT) 10 MG tablet TAKE 1 TABLET BY MOUTH EVERYDAY AT BEDTIME  . Dorzolamide HCl-Timolol Mal PF 22.3-6.8 MG/ML SOLN Apply 1 drop to eye 2 (two) times daily. Left eye  . fluticasone (FLONASE) 50 MCG/ACT nasal spray INSTILL 1 SPRAY INTO EACH NOSTRIL TWICE A DAY  . latanoprost (XALATAN) 0.005 % ophthalmic solution Place 1 drop into both eyes at bedtime.  Marland Kitchen letrozole (FEMARA) 2.5 MG tablet letrozole 2.5 mg tablet  . memantine (NAMENDA) 10 MG tablet TAKE 1 TABLET BY MOUTH TWICE A DAY  . nystatin cream (MYCOSTATIN) Apply 1 application topically 2 (two) times daily.  Marland Kitchen olmesartan (BENICAR) 40 MG tablet 1/2 pill daily for BP  . quinapril (ACCUPRIL) 20 MG tablet TAKE 1 TABLET BY MOUTH EVERY DAY  . rosuvastatin (CRESTOR) 20 MG tablet Take 1 tablet Daily for Cholesterol  . triamcinolone ointment (KENALOG) 0.1 % Apply 1 application topically 2 (two) times daily.  Marland Kitchen LATANOPROST OP Apply to eye.  Marland Kitchen OVER THE COUNTER MEDICATION daily. B-Complex with 1000 mcg Biotin   No current facility-administered medications on file prior to visit.     Allergies:  Allergies  Allergen Reactions  . Minocycline   . Prednisone Swelling  . Sulfa Antibiotics     Patient does not remember the type of reaction, happened years ago.  . Zithromax [Azithromycin] Swelling  . Ciprofloxacin Hcl Rash  . Penicillins Rash      Medical History:  Past Medical History:  Diagnosis Date  . Breast cancer ([place])   . Chest tightness   . GERD (gastroesophageal reflux disease)   . Hepatitis C virus screening Negative 08/31/2012 03/08/2018  . Hyperlipidemia   . Hypertension   . Palpitations   . Prediabetes   . Vitamin D deficiency     Family history- Reviewed and unchanged   Social history- Reviewed and unchanged   Names of Other Physician/Practitioners you currently use: 1. [facility] here for primary care 2. Eye Exam: Due for 2020 3. Dental Exam Due for 2020  Patient Care Team: Unk Pinto, MD as PCP - General (Internal Medicine) Clarene Essex, MD as Consulting Physician (Gastroenterology) Meda Klinefelter, MD as Consulting Physician (Endocrinology) Mordecai Rasmussen, MD as Consulting Physician (Surgery)   Screening Tests: Immunization History  Administered Date(s) Administered  . Influenza, High Dose Seasonal PF 03/07/2014, 05/08/2015, 03/11/2017, 03/03/2018, 04/06/2019  .  PPD Test 08/30/2013, 10/04/2014, 10/24/2015  . Pneumococcal Conjugate-13 03/24/2014  . Pneumococcal Polysaccharide-23 09/08/2006, 03/11/2017  . Tdap 08/27/2012  . Zoster 09/03/2012     Vaccinations: TD or Tdap: 08/2012  Influenza: Received today  Pneumococcal: 03/2017 Prevnar13: 03/2014 Shingles: Zostavax/Shingrix: 2014   Preventative Care: Last colonoscopy: 2014 Last mammogram: 2011 double mastectomy Last pap smear/pelvic exam: 2015 DEXA:04/2016 osteopenia, DUE order entered, information provided to patient/husband Hep C screening (1945-1965): 08/31/2012   Review of Systems:  Review of Systems  Constitutional: Negative for chills, diaphoresis, fever, malaise/fatigue and weight loss.  HENT: Negative for congestion, ear discharge, ear pain, hearing loss, nosebleeds, sinus pain, sore throat and tinnitus.   Eyes: Negative for blurred vision, double vision, photophobia,  pain, discharge and redness.  Respiratory: Negative for cough, hemoptysis, sputum production, shortness of breath, wheezing and stridor.   Cardiovascular: Negative for chest pain, palpitations, orthopnea, claudication, leg swelling and PND.  Gastrointestinal: Negative for abdominal pain, blood in stool, constipation, diarrhea, heartburn, melena, nausea and vomiting.  Genitourinary: Negative for dysuria, flank pain, frequency, hematuria and urgency.  Musculoskeletal: Negative for back pain, falls, joint pain, myalgias and neck pain.  Skin: Negative for itching and rash.  Neurological: Negative for dizziness, tingling, tremors, sensory change, speech change, focal weakness, seizures, loss of consciousness, weakness and headaches.  Endo/Heme/Allergies: Negative for environmental allergies and polydipsia. Does not bruise/bleed easily.  Psychiatric/Behavioral: Positive for memory loss. Negative for depression, hallucinations, substance abuse and suicidal ideas. The patient is not nervous/anxious and does not have insomnia.       Physical Exam: BP 122/74   Pulse 63   Temp (!) 97.2 F (36.2 C)   Ht 5\' 7"  (1.702 m)   Wt 109 lb 3.2 oz (49.5 kg)   SpO2 97%   BMI 17.10 kg/m  Wt Readings from Last 3 Encounters:  04/06/19 109 lb 3.2 oz (49.5 kg)  02/23/19 111 lb 3.2 oz (50.4 kg)  12/31/18 106 lb 12.8 oz (48.4 kg)   General Appearance: thin, in no apparent distress. Eyes: PERRLA, EOMs, conjunctiva no swelling or erythema Sinuses: No Frontal/maxillary tenderness ENT/Mouth: Ext aud canals clear, TMs without erythema, bulging. No erythema, swelling, or exudate on post pharynx.  Tonsils not swollen or erythematous. Hearing normal.  Neck: Supple, thyroid normal.  Respiratory: Respiratory effort normal, BS equal bilaterally without rales, rhonchi, wheezing or stridor.  Cardio: RRR with no MRGs. Brisk peripheral pulses without edema.  Abdomen: Soft, + BS.  Non tender, no guarding, rebound, hernias,  masses. Lymphatics: Non tender without lymphadenopathy.  Musculoskeletal: Full ROM, 5/5 strength, Normal gait Skin: Warm, dry without rashes, lesions, ecchymosis.  Neuro: Cranial nerves intact. No cerebellar symptoms.  Psych: Awake and oriented X 2, normal affect, Insight and Judgment appropriate.    Garnet Sierras, NP [facility] 10:55 AM

## 2019-04-06 ENCOUNTER — Ambulatory Visit (INDEPENDENT_AMBULATORY_CARE_PROVIDER_SITE_OTHER): Payer: Medicare HMO | Admitting: Adult Health Nurse Practitioner

## 2019-04-06 ENCOUNTER — Encounter: Payer: Self-pay | Admitting: Adult Health Nurse Practitioner

## 2019-04-06 ENCOUNTER — Other Ambulatory Visit: Payer: Self-pay

## 2019-04-06 ENCOUNTER — Ambulatory Visit: Payer: Medicare HMO | Admitting: Adult Health

## 2019-04-06 VITALS — BP 122/74 | HR 63 | Temp 97.2°F | Ht 67.0 in | Wt 109.2 lb

## 2019-04-06 DIAGNOSIS — Z23 Encounter for immunization: Secondary | ICD-10-CM

## 2019-04-06 DIAGNOSIS — R8290 Unspecified abnormal findings in urine: Secondary | ICD-10-CM

## 2019-04-06 DIAGNOSIS — E210 Primary hyperparathyroidism: Secondary | ICD-10-CM | POA: Diagnosis not present

## 2019-04-06 DIAGNOSIS — Z79899 Other long term (current) drug therapy: Secondary | ICD-10-CM

## 2019-04-06 DIAGNOSIS — R7309 Other abnormal glucose: Secondary | ICD-10-CM

## 2019-04-06 DIAGNOSIS — R350 Frequency of micturition: Secondary | ICD-10-CM

## 2019-04-06 DIAGNOSIS — Z681 Body mass index (BMI) 19 or less, adult: Secondary | ICD-10-CM | POA: Diagnosis not present

## 2019-04-06 DIAGNOSIS — E782 Mixed hyperlipidemia: Secondary | ICD-10-CM

## 2019-04-06 DIAGNOSIS — K219 Gastro-esophageal reflux disease without esophagitis: Secondary | ICD-10-CM

## 2019-04-06 DIAGNOSIS — M85859 Other specified disorders of bone density and structure, unspecified thigh: Secondary | ICD-10-CM

## 2019-04-06 DIAGNOSIS — E559 Vitamin D deficiency, unspecified: Secondary | ICD-10-CM

## 2019-04-06 DIAGNOSIS — I10 Essential (primary) hypertension: Secondary | ICD-10-CM | POA: Diagnosis not present

## 2019-04-06 DIAGNOSIS — E611 Iron deficiency: Secondary | ICD-10-CM | POA: Diagnosis not present

## 2019-04-06 DIAGNOSIS — R69 Illness, unspecified: Secondary | ICD-10-CM | POA: Diagnosis not present

## 2019-04-06 DIAGNOSIS — E441 Mild protein-calorie malnutrition: Secondary | ICD-10-CM

## 2019-04-06 DIAGNOSIS — F0150 Vascular dementia without behavioral disturbance: Secondary | ICD-10-CM

## 2019-04-07 LAB — COMPLETE METABOLIC PANEL WITH GFR
AG Ratio: 1.6 (calc) (ref 1.0–2.5)
ALT: 22 U/L (ref 6–29)
AST: 28 U/L (ref 10–35)
Albumin: 4.5 g/dL (ref 3.6–5.1)
Alkaline phosphatase (APISO): 106 U/L (ref 37–153)
BUN/Creatinine Ratio: 14 (calc) (ref 6–22)
BUN: 15 mg/dL (ref 7–25)
CO2: 26 mmol/L (ref 20–32)
Calcium: 11 mg/dL — ABNORMAL HIGH (ref 8.6–10.4)
Chloride: 105 mmol/L (ref 98–110)
Creat: 1.11 mg/dL — ABNORMAL HIGH (ref 0.60–0.93)
GFR, Est African American: 58 mL/min/{1.73_m2} — ABNORMAL LOW (ref >=60)
GFR, Est Non African American: 50 mL/min/{1.73_m2} — ABNORMAL LOW (ref >=60)
Globulin: 2.8 g/dL (calc) (ref 1.9–3.7)
Glucose, Bld: 101 mg/dL — ABNORMAL HIGH (ref 65–99)
Potassium: 4.2 mmol/L (ref 3.5–5.3)
Sodium: 142 mmol/L (ref 135–146)
Total Bilirubin: 1.2 mg/dL (ref 0.2–1.2)
Total Protein: 7.3 g/dL (ref 6.1–8.1)

## 2019-04-07 LAB — NO CULTURE INDICATED

## 2019-04-07 LAB — CBC WITH DIFFERENTIAL/PLATELET
Absolute Monocytes: 222 cells/uL (ref 200–950)
Basophils Absolute: 20 cells/uL (ref 0–200)
Basophils Relative: 0.5 %
Eosinophils Absolute: 12 cells/uL — ABNORMAL LOW (ref 15–500)
Eosinophils Relative: 0.3 %
HCT: 47.8 % — ABNORMAL HIGH (ref 35.0–45.0)
Hemoglobin: 16.1 g/dL — ABNORMAL HIGH (ref 11.7–15.5)
Lymphs Abs: 920 cells/uL (ref 850–3900)
MCH: 32.1 pg (ref 27.0–33.0)
MCHC: 33.7 g/dL (ref 32.0–36.0)
MCV: 95.4 fL (ref 80.0–100.0)
MPV: 10.7 fL (ref 7.5–12.5)
Monocytes Relative: 5.7 %
Neutro Abs: 2726 cells/uL (ref 1500–7800)
Neutrophils Relative %: 69.9 %
Platelets: 176 10*3/uL (ref 140–400)
RBC: 5.01 10*6/uL (ref 3.80–5.10)
RDW: 11.6 % (ref 11.0–15.0)
Total Lymphocyte: 23.6 %
WBC: 3.9 10*3/uL (ref 3.8–10.8)

## 2019-04-07 LAB — LIPID PANEL
Cholesterol: 174 mg/dL (ref <200)
HDL: 77 mg/dL (ref >=50)
LDL Cholesterol (Calc): 83 mg/dL (calc)
Non-HDL Cholesterol (Calc): 97 mg/dL (calc) (ref <130)
Total CHOL/HDL Ratio: 2.3 (calc) (ref <5.0)
Triglycerides: 61 mg/dL (ref <150)

## 2019-04-07 LAB — URINALYSIS W MICROSCOPIC + REFLEX CULTURE
Bacteria, UA: NONE SEEN /HPF
Bilirubin Urine: NEGATIVE
Glucose, UA: NEGATIVE
Hgb urine dipstick: NEGATIVE
Hyaline Cast: NONE SEEN /LPF
Ketones, ur: NEGATIVE
Leukocyte Esterase: NEGATIVE
Nitrites, Initial: NEGATIVE
Protein, ur: NEGATIVE
Specific Gravity, Urine: 1.016 (ref 1.001–1.03)
WBC, UA: NONE SEEN /HPF (ref 0–5)
pH: <=5 (ref 5.0–8.0)

## 2019-04-07 LAB — HEMOGLOBIN A1C
Hgb A1c MFr Bld: 5.4 % of total Hgb (ref <5.7)
Mean Plasma Glucose: 108 (calc)
eAG (mmol/L): 6 (calc)

## 2019-04-07 LAB — VITAMIN D 25 HYDROXY (VIT D DEFICIENCY, FRACTURES): Vit D, 25-Hydroxy: 33 ng/mL (ref 30–100)

## 2019-04-07 LAB — TSH: TSH: 0.87 mIU/L (ref 0.40–4.50)

## 2019-04-07 LAB — MAGNESIUM: Magnesium: 2.1 mg/dL (ref 1.5–2.5)

## 2019-04-10 ENCOUNTER — Other Ambulatory Visit: Payer: Self-pay | Admitting: Adult Health Nurse Practitioner

## 2019-04-10 DIAGNOSIS — E559 Vitamin D deficiency, unspecified: Secondary | ICD-10-CM

## 2019-04-10 MED ORDER — CHOLECALCIFEROL 1.25 MG (50000 UT) PO CAPS: ORAL_CAPSULE | ORAL | 0 refills | Status: DC

## 2019-04-11 ENCOUNTER — Other Ambulatory Visit: Payer: Self-pay | Admitting: Internal Medicine

## 2019-04-11 DIAGNOSIS — R413 Other amnesia: Secondary | ICD-10-CM

## 2019-04-11 DIAGNOSIS — F0150 Vascular dementia without behavioral disturbance: Secondary | ICD-10-CM

## 2019-06-10 ENCOUNTER — Encounter: Payer: Medicare HMO | Attending: Psychology | Admitting: Psychology

## 2019-06-25 ENCOUNTER — Other Ambulatory Visit: Payer: Self-pay | Admitting: Adult Health Nurse Practitioner

## 2019-06-25 DIAGNOSIS — E559 Vitamin D deficiency, unspecified: Secondary | ICD-10-CM

## 2019-07-06 ENCOUNTER — Encounter: Payer: Self-pay | Admitting: Internal Medicine

## 2019-07-06 NOTE — Progress Notes (Signed)
History of Present Illness:      This very nice 74 y.o. MBF presents for 6 month follow up with HTN, HLD, Pre-Diabetes, hx/o Breast Cancer (2011),  and Vitamin D Deficiency.  Patient has GERD controlled on her meds. Patient is on Namenda / Aricept for  Vascular Dementia & is followed by DrAhern. She is still functional in her ADL's, but requires supervision by her husband. In 2001, patient had an active parathyroid adenoma excised .      Patient is treated for HTN (1990)  & BP has been controlled at home. Today's BP is at goal - 136/82. Patient has had no complaints of any cardiac type chest pain, palpitations, dyspnea / orthopnea / PND, dizziness, claudication, or dependent edema.      Hyperlipidemia is controlled with diet & meds. Patient denies myalgias or other med SE's. Last Lipids were at goal:  Lab Results  Component Value Date   CHOL 174 04/06/2019   HDL 77 04/06/2019   LDLCALC 83 04/06/2019   TRIG 61 04/06/2019   CHOLHDL 2.3 04/06/2019        Also, the patient has history of PreDiabetes (A1c 6.0%/2011) and has had no symptoms of reactive hypoglycemia, diabetic polys, paresthesias or visual blurring.  Last A1c was Normal & at goal:  Lab Results  Component Value Date   HGBA1C 5.4 04/06/2019        Further, the patient also has history of Vitamin D Deficiency ("13"/2008)   and supplements vitamin D without any suspected side-effects. Last vitamin D was still very low:  Lab Results  Component Value Date   VD25OH 33 04/06/2019    Current Outpatient Medications on File Prior to Visit  Medication Sig  . aspirin 81 MG tablet Take 81 mg by mouth daily.   Marland Kitchen atenolol (TENORMIN) 100 MG tablet TAKE 1 TABLET DAILY FOR BLOOD PRESSURE  . Cholecalciferol (VITAMIN D3) 1.25 MG (50000 UT) CAPS TAKE ONE CAPSULE THREE DAYS A WEEK FOR TWELVE WEEKS.  . donepezil (ARICEPT) 10 MG tablet TAKE 1 TABLET BY MOUTH EVERYDAY AT BEDTIME  . Dorzolamide HCl-Timolol Mal PF 22.3-6.8 MG/ML SOLN  Apply 1 drop to eye 2 (two) times daily. Left eye  . fluticasone (FLONASE) 50 MCG/ACT nasal spray INSTILL 1 SPRAY INTO EACH NOSTRIL TWICE A DAY  . latanoprost (XALATAN) 0.005 % ophthalmic solution Place 1 drop into both eyes at bedtime.  Marland Kitchen LATANOPROST OP Apply to eye.  . memantine (NAMENDA) 10 MG tablet Take 1 tablet (10 mg total) by mouth 2 (two) times daily. Take 1 tablet 2 x /day for Memory  . nystatin cream (MYCOSTATIN) Apply 1 application topically 2 (two) times daily.  Marland Kitchen olmesartan (BENICAR) 40 MG tablet 1/2 pill daily for BP  . OVER THE COUNTER MEDICATION daily. B-Complex with 1000 mcg Biotin  . quinapril (ACCUPRIL) 20 MG tablet TAKE 1 TABLET BY MOUTH EVERY DAY  . rosuvastatin (CRESTOR) 20 MG tablet Take 1 tablet Daily for Cholesterol  . triamcinolone ointment (KENALOG) 0.1 % Apply 1 application topically 2 (two) times daily.  Marland Kitchen letrozole (FEMARA) 2.5 MG tablet letrozole 2.5 mg tablet   No current facility-administered medications on file prior to visit.    Allergies  Allergen Reactions  . Minocycline   . Prednisone Swelling  . Sulfa Antibiotics     Patient does not remember the type of reaction, happened years ago.  . Zithromax [Azithromycin] Swelling  . Ciprofloxacin Hcl Rash  . Penicillins Rash  PMHx:   Past Medical History:  Diagnosis Date  . Breast cancer ([place])   . Chest tightness   . GERD (gastroesophageal reflux disease)   . Hepatitis C virus screening Negative 08/31/2012 03/08/2018  . Hyperlipidemia   . Hypertension   . Palpitations   . Prediabetes   . Vitamin D deficiency    Immunization History  Administered Date(s) Administered  . Influenza, High Dose Seasonal PF 03/07/2014, 05/08/2015, 03/11/2017, 03/03/2018, 04/06/2019  . PFIZER SARS-COV-2 Vaccination 07/02/2019  . PPD Test 08/30/2013, 10/04/2014, 10/24/2015  . Pneumococcal Conjugate-13 03/24/2014  . Pneumococcal Polysaccharide-23 09/08/2006, 03/11/2017  . Tdap 08/27/2012  . Zoster 09/03/2012    Past Surgical History:  Procedure Laterality Date  . MASTECTOMY  August 2011   Bilateral  . NASAL SINUS SURGERY    . PARATHYROIDECTOMY      FHx:    Reviewed / unchanged  SHx:    Reviewed / unchanged   Systems Review:  Constitutional: Denies fever, chills, wt changes, headaches, insomnia, fatigue, night sweats, change in appetite. Eyes: Denies redness, blurred vision, diplopia, discharge, itchy, watery eyes.  ENT: Denies discharge, congestion, post nasal drip, epistaxis, sore throat, earache, hearing loss, dental pain, tinnitus, vertigo, sinus pain, snoring.  CV: Denies chest pain, palpitations, irregular heartbeat, syncope, dyspnea, diaphoresis, orthopnea, PND, claudication or edema. Respiratory: denies cough, dyspnea, DOE, pleurisy, hoarseness, laryngitis, wheezing.  Gastrointestinal: Denies dysphagia, odynophagia, heartburn, reflux, water brash, abdominal pain or cramps, nausea, vomiting, bloating, diarrhea, constipation, hematemesis, melena, hematochezia  or hemorrhoids. Genitourinary: Denies dysuria, frequency, urgency, nocturia, hesitancy, discharge, hematuria or flank pain. Musculoskeletal: Denies arthralgias, myalgias, stiffness, jt. swelling, pain, limping or strain/sprain.  Skin: Denies pruritus, rash, hives, warts, acne, eczema or change in skin lesion(s). Neuro: No weakness, tremor, incoordination, spasms, paresthesia or pain. Psychiatric: Denies confusion, memory loss or sensory loss. Endo: Denies change in weight, skin or hair change.  Heme/Lymph: No excessive bleeding, bruising or enlarged lymph nodes.  Physical Exam  BP 136/82   Pulse 64   Temp 97.6 F (36.4 C)   Resp 16   Ht 5\' 7"  (1.702 m)   Wt 108 lb 12.8 oz (49.4 kg)   BMI 17.04 kg/m   Appears  well nourished, well groomed  and in no distress.  Eyes: PERRLA, EOMs, conjunctiva no swelling or erythema. Sinuses: No frontal/maxillary tenderness ENT/Mouth: EAC's clear, TM's nl w/o erythema, bulging. Nares  clear w/o erythema, swelling, exudates. Oropharynx clear without erythema or exudates. Oral hygiene is good. Tongue normal, non obstructing. Hearing intact.  Neck: Supple. Thyroid not palpable. Car 2+/2+ without bruits, nodes or JVD. Chest: Respirations nl with BS clear & equal w/o rales, rhonchi, wheezing or stridor.  Cor: Heart sounds normal w/ regular rate and rhythm without sig. murmurs, gallops, clicks or rubs. Peripheral pulses normal and equal  without edema.  Abdomen: Soft & bowel sounds normal. Non-tender w/o guarding, rebound, hernias, masses or organomegaly.  Lymphatics: Unremarkable.  Musculoskeletal: Full ROM all peripheral extremities, joint stability, 5/5 strength and normal gait.  Skin: Warm, dry without exposed rashes, lesions or ecchymosis apparent.  Neuro: Cranial nerves intact, reflexes equal bilaterally. Sensory-motor testing grossly intact. Tendon reflexes grossly intact.  Pysch: Alert & oriented x 3.  Insight and judgement nl & appropriate. No ideations.  Assessment and Plan:  1. Essential hypertension  - Continue medication, monitor blood pressure at home.  - Continue DASH diet.  Reminder to go to the ER if any CP,  SOB, nausea, dizziness, severe HA, changes vision/speech.  - CBC  with Differential/Platelet - COMPLETE METABOLIC PANEL WITH GFR - Magnesium - TSH  2. Hyperlipidemia, mixed  - Continue diet/meds, exercise,& lifestyle modifications.  - Continue monitor periodic cholesterol/liver & renal functions   - Lipid panel - TSH  3. Abnormal glucose  - Continue diet, exercise  - Lifestyle modifications.  - Monitor appropriate labs.  - Hemoglobin A1c - Insulin, random  4. Vitamin D deficiency  - Continue supplementation.  - VITAMIN D 25 Hydroxy  5. Vascular dementia without behavioral disturbance (HCC)  - Lipid panel  6. Medication management  - CBC with Differential/Platelet - COMPLETE METABOLIC PANEL WITH GFR - Magnesium - Lipid panel -  TSH - Hemoglobin A1c - Insulin, random - VITAMIN D 25 Hydroxy          Discussed  regular exercise, BP monitoring, weight control to achieve/maintain BMI less than 25 and discussed med and SE's. Recommended labs to assess and monitor clinical status with further disposition pending results of labs.  I discussed the assessment and treatment plan with the patient. The patient was provided an opportunity to ask questions and all were answered. The patient agreed with the plan and demonstrated an understanding of the instructions.  I provided over 30 minutes of exam, counseling, chart review and  complex critical decision making.  Kirtland Bouchard, MD

## 2019-07-06 NOTE — Patient Instructions (Signed)

## 2019-07-07 ENCOUNTER — Encounter: Payer: Self-pay | Admitting: Internal Medicine

## 2019-07-07 ENCOUNTER — Ambulatory Visit (INDEPENDENT_AMBULATORY_CARE_PROVIDER_SITE_OTHER): Payer: Medicare HMO | Admitting: Internal Medicine

## 2019-07-07 ENCOUNTER — Other Ambulatory Visit: Payer: Self-pay

## 2019-07-07 VITALS — BP 136/82 | HR 64 | Temp 97.6°F | Resp 16 | Ht 67.0 in | Wt 108.8 lb

## 2019-07-07 DIAGNOSIS — Z79899 Other long term (current) drug therapy: Secondary | ICD-10-CM

## 2019-07-07 DIAGNOSIS — I10 Essential (primary) hypertension: Secondary | ICD-10-CM | POA: Diagnosis not present

## 2019-07-07 DIAGNOSIS — R634 Abnormal weight loss: Secondary | ICD-10-CM

## 2019-07-07 DIAGNOSIS — E782 Mixed hyperlipidemia: Secondary | ICD-10-CM

## 2019-07-07 DIAGNOSIS — R7309 Other abnormal glucose: Secondary | ICD-10-CM | POA: Diagnosis not present

## 2019-07-07 DIAGNOSIS — E559 Vitamin D deficiency, unspecified: Secondary | ICD-10-CM

## 2019-07-07 DIAGNOSIS — R69 Illness, unspecified: Secondary | ICD-10-CM | POA: Diagnosis not present

## 2019-07-07 DIAGNOSIS — F0150 Vascular dementia without behavioral disturbance: Secondary | ICD-10-CM

## 2019-07-07 MED ORDER — MIRTAZAPINE 15 MG PO TABS: ORAL_TABLET | ORAL | 3 refills | Status: DC

## 2019-07-08 LAB — CBC WITH DIFFERENTIAL/PLATELET
Absolute Monocytes: 230 cells/uL (ref 200–950)
Basophils Absolute: 22 cells/uL (ref 0–200)
Basophils Relative: 0.6 %
Eosinophils Absolute: 22 cells/uL (ref 15–500)
Eosinophils Relative: 0.6 %
HCT: 43.6 % (ref 35.0–45.0)
Hemoglobin: 14.6 g/dL (ref 11.7–15.5)
Lymphs Abs: 864 cells/uL (ref 850–3900)
MCH: 31.4 pg (ref 27.0–33.0)
MCHC: 33.5 g/dL (ref 32.0–36.0)
MCV: 93.8 fL (ref 80.0–100.0)
MPV: 9.8 fL (ref 7.5–12.5)
Monocytes Relative: 6.4 %
Neutro Abs: 2462 cells/uL (ref 1500–7800)
Neutrophils Relative %: 68.4 %
Platelets: 187 10*3/uL (ref 140–400)
RBC: 4.65 10*6/uL (ref 3.80–5.10)
RDW: 11.7 % (ref 11.0–15.0)
Total Lymphocyte: 24 %
WBC: 3.6 10*3/uL — ABNORMAL LOW (ref 3.8–10.8)

## 2019-07-08 LAB — LIPID PANEL
Cholesterol: 174 mg/dL (ref <200)
HDL: 75 mg/dL (ref >=50)
LDL Cholesterol (Calc): 86 mg/dL (calc)
Non-HDL Cholesterol (Calc): 99 mg/dL (calc) (ref <130)
Total CHOL/HDL Ratio: 2.3 (calc) (ref <5.0)
Triglycerides: 58 mg/dL (ref <150)

## 2019-07-08 LAB — COMPLETE METABOLIC PANEL WITH GFR
AG Ratio: 1.8 (calc) (ref 1.0–2.5)
ALT: 42 U/L — ABNORMAL HIGH (ref 6–29)
AST: 43 U/L — ABNORMAL HIGH (ref 10–35)
Albumin: 4.3 g/dL (ref 3.6–5.1)
Alkaline phosphatase (APISO): 100 U/L (ref 37–153)
BUN/Creatinine Ratio: 17 (calc) (ref 6–22)
BUN: 17 mg/dL (ref 7–25)
CO2: 30 mmol/L (ref 20–32)
Calcium: 10.4 mg/dL (ref 8.6–10.4)
Chloride: 106 mmol/L (ref 98–110)
Creat: 1 mg/dL — ABNORMAL HIGH (ref 0.60–0.93)
GFR, Est African American: 66 mL/min/{1.73_m2} (ref >=60)
GFR, Est Non African American: 57 mL/min/{1.73_m2} — ABNORMAL LOW (ref >=60)
Globulin: 2.4 g/dL (calc) (ref 1.9–3.7)
Glucose, Bld: 109 mg/dL — ABNORMAL HIGH (ref 65–99)
Potassium: 4.5 mmol/L (ref 3.5–5.3)
Sodium: 143 mmol/L (ref 135–146)
Total Bilirubin: 0.8 mg/dL (ref 0.2–1.2)
Total Protein: 6.7 g/dL (ref 6.1–8.1)

## 2019-07-08 LAB — VITAMIN D 25 HYDROXY (VIT D DEFICIENCY, FRACTURES): Vit D, 25-Hydroxy: 102 ng/mL — ABNORMAL HIGH (ref 30–100)

## 2019-07-08 LAB — HEMOGLOBIN A1C
Hgb A1c MFr Bld: 5.8 % of total Hgb — ABNORMAL HIGH (ref <5.7)
Mean Plasma Glucose: 120 (calc)
eAG (mmol/L): 6.6 (calc)

## 2019-07-08 LAB — TSH: TSH: 0.99 mIU/L (ref 0.40–4.50)

## 2019-07-08 LAB — MAGNESIUM: Magnesium: 2 mg/dL (ref 1.5–2.5)

## 2019-07-08 LAB — INSULIN, RANDOM: Insulin: 39.9 u[IU]/mL — ABNORMAL HIGH

## 2019-07-13 ENCOUNTER — Other Ambulatory Visit: Payer: Self-pay | Admitting: Internal Medicine

## 2019-07-13 DIAGNOSIS — R413 Other amnesia: Secondary | ICD-10-CM

## 2019-07-13 DIAGNOSIS — F0150 Vascular dementia without behavioral disturbance: Secondary | ICD-10-CM

## 2019-08-25 ENCOUNTER — Encounter: Payer: Self-pay | Admitting: Adult Health

## 2019-08-25 ENCOUNTER — Ambulatory Visit: Payer: Medicare HMO | Admitting: Adult Health

## 2019-08-25 ENCOUNTER — Telehealth: Payer: Self-pay | Admitting: *Deleted

## 2019-08-25 NOTE — Telephone Encounter (Signed)
Patient was no show for follow up with NP today.  

## 2019-10-07 ENCOUNTER — Ambulatory Visit: Payer: Medicare HMO | Admitting: Adult Health Nurse Practitioner

## 2019-10-19 ENCOUNTER — Ambulatory Visit (INDEPENDENT_AMBULATORY_CARE_PROVIDER_SITE_OTHER): Payer: Medicare HMO | Admitting: Adult Health Nurse Practitioner

## 2019-10-19 ENCOUNTER — Encounter: Payer: Self-pay | Admitting: Adult Health Nurse Practitioner

## 2019-10-19 ENCOUNTER — Other Ambulatory Visit: Payer: Self-pay

## 2019-10-19 VITALS — BP 112/80 | Temp 97.2°F | Resp 16 | Ht 67.0 in | Wt 119.6 lb

## 2019-10-19 DIAGNOSIS — Z0001 Encounter for general adult medical examination with abnormal findings: Secondary | ICD-10-CM

## 2019-10-19 DIAGNOSIS — R6889 Other general symptoms and signs: Secondary | ICD-10-CM

## 2019-10-19 DIAGNOSIS — F0150 Vascular dementia without behavioral disturbance: Secondary | ICD-10-CM

## 2019-10-19 DIAGNOSIS — M85851 Other specified disorders of bone density and structure, right thigh: Secondary | ICD-10-CM

## 2019-10-19 DIAGNOSIS — R7309 Other abnormal glucose: Secondary | ICD-10-CM | POA: Diagnosis not present

## 2019-10-19 DIAGNOSIS — Z79899 Other long term (current) drug therapy: Secondary | ICD-10-CM

## 2019-10-19 DIAGNOSIS — R69 Illness, unspecified: Secondary | ICD-10-CM | POA: Diagnosis not present

## 2019-10-19 DIAGNOSIS — E782 Mixed hyperlipidemia: Secondary | ICD-10-CM | POA: Diagnosis not present

## 2019-10-19 DIAGNOSIS — I10 Essential (primary) hypertension: Secondary | ICD-10-CM

## 2019-10-19 DIAGNOSIS — E559 Vitamin D deficiency, unspecified: Secondary | ICD-10-CM

## 2019-10-19 DIAGNOSIS — E441 Mild protein-calorie malnutrition: Secondary | ICD-10-CM | POA: Diagnosis not present

## 2019-10-19 DIAGNOSIS — Z681 Body mass index (BMI) 19 or less, adult: Secondary | ICD-10-CM

## 2019-10-19 DIAGNOSIS — E210 Primary hyperparathyroidism: Secondary | ICD-10-CM | POA: Diagnosis not present

## 2019-10-19 DIAGNOSIS — Z170 Estrogen receptor positive status [ER+]: Secondary | ICD-10-CM

## 2019-10-19 DIAGNOSIS — C50412 Malignant neoplasm of upper-outer quadrant of left female breast: Secondary | ICD-10-CM | POA: Diagnosis not present

## 2019-10-19 DIAGNOSIS — H4010X0 Unspecified open-angle glaucoma, stage unspecified: Secondary | ICD-10-CM

## 2019-10-19 DIAGNOSIS — Z0000 Encounter for general adult medical examination without abnormal findings: Secondary | ICD-10-CM

## 2019-10-19 DIAGNOSIS — K219 Gastro-esophageal reflux disease without esophagitis: Secondary | ICD-10-CM

## 2019-10-19 LAB — CBC WITH DIFFERENTIAL/PLATELET
Absolute Monocytes: 361 cells/uL (ref 200–950)
Basophils Absolute: 22 cells/uL (ref 0–200)
Basophils Relative: 0.5 %
Eosinophils Absolute: 39 cells/uL (ref 15–500)
Eosinophils Relative: 0.9 %
HCT: 43.4 % (ref 35.0–45.0)
Hemoglobin: 14.4 g/dL (ref 11.7–15.5)
Lymphs Abs: 1049 cells/uL (ref 850–3900)
MCH: 31.3 pg (ref 27.0–33.0)
MCHC: 33.2 g/dL (ref 32.0–36.0)
MCV: 94.3 fL (ref 80.0–100.0)
MPV: 10 fL (ref 7.5–12.5)
Monocytes Relative: 8.4 %
Neutro Abs: 2829 cells/uL (ref 1500–7800)
Neutrophils Relative %: 65.8 %
Platelets: 172 10*3/uL (ref 140–400)
RBC: 4.6 10*6/uL (ref 3.80–5.10)
RDW: 11.5 % (ref 11.0–15.0)
Total Lymphocyte: 24.4 %
WBC: 4.3 10*3/uL (ref 3.8–10.8)

## 2019-10-19 LAB — COMPLETE METABOLIC PANEL WITH GFR
AG Ratio: 1.6 (calc) (ref 1.0–2.5)
ALT: 30 U/L — ABNORMAL HIGH (ref 6–29)
AST: 31 U/L (ref 10–35)
Albumin: 4.3 g/dL (ref 3.6–5.1)
Alkaline phosphatase (APISO): 115 U/L (ref 37–153)
BUN/Creatinine Ratio: 15 (calc) (ref 6–22)
BUN: 15 mg/dL (ref 7–25)
CO2: 28 mmol/L (ref 20–32)
Calcium: 10.6 mg/dL — ABNORMAL HIGH (ref 8.6–10.4)
Chloride: 107 mmol/L (ref 98–110)
Creat: 0.99 mg/dL — ABNORMAL HIGH (ref 0.60–0.93)
GFR, Est African American: 66 mL/min/{1.73_m2} (ref >=60)
GFR, Est Non African American: 57 mL/min/{1.73_m2} — ABNORMAL LOW (ref >=60)
Globulin: 2.7 g/dL (calc) (ref 1.9–3.7)
Glucose, Bld: 64 mg/dL — ABNORMAL LOW (ref 65–99)
Potassium: 4.3 mmol/L (ref 3.5–5.3)
Sodium: 144 mmol/L (ref 135–146)
Total Bilirubin: 0.9 mg/dL (ref 0.2–1.2)
Total Protein: 7 g/dL (ref 6.1–8.1)

## 2019-10-19 NOTE — Progress Notes (Addendum)
MEDICARE ANNUAL WELLNESS VISIT AND 3 MONTH FOLLOW UP  Assessment:    Encounter for Medicare annual wellness exam 1 year  Essential hypertension Continue current medications: Atenolol and quinapril Monitor blood pressure at home; call if consistently over 130/80 Continue DASH diet.   Reminder to go to the ER if any CP, SOB, nausea, dizziness, severe HA, changes vision/speech, left arm numbness and tingling and jaw pain. -     CBC with Differential/Platelet -     COMPLETE METABOLIC PANEL WITH GFR -     TSH  Hyperlipademia, mixed Continue medications: rosuvastatin 20mg  daily Discussed dietary and exercise modifications Low fat diet  Hyperparathyroidism, primary ([place]) Continue to check labs  Vascular dementia without behavioral disturbance ([place]) Taking namenda and aircept Follows with Shannon Nielsen  Gastroesophageal reflux disease, esophagitis presence not specified Continue PPI/H2 blocker, diet discussed  Malignant neoplasm of upper-outer quadrant of left breast in female, estrogen receptor positive ([place]) S/p mastectomy  Open-angle glaucoma of right eye, unspecified glaucoma stage, unspecified open-angle glaucoma type S/p surgery doing well  Mild malnutrition ([facility]) Increase weight Monitor closely If continuing weight loss with breast cancer history and mild epigastric pain, we will get a CT AB/Chest- declines at this time Given ensure/boost samples  Hyperlipidemia, mixed -     Lipid panel check lipids decrease fatty foods increase activity.  Abnormal glucose Discussed disease progression and risks Discussed diet/exercise, weight management and risk modification -     Hemoglobin A1c  Body mass index (BMI) of 19.0-19.9 in adult Increase weight Monitor closely If continuing weight loss with breast cancer history    Medication management Continued  Vitamin D deficiency Continue supplement   Over 40 minutes of chart review, face to face exam, counseling, chart  review and critical decision making was performed. Future Appointments  Date Time Provider May  01/06/2020 11:30 AM [facility] DX DEXA 1 [facility]  02/01/2020 10:00 AM Shannon Pinto, MD [facility] None  10/24/2020 10:00 AM Shannon Sierras, NP [facility] None     Plan:   During the course of the visit the patient was educated and counseled about appropriate screening and preventive services including:    Pneumococcal vaccine   Prevnar 13  Influenza vaccine  Td vaccine  Screening electrocardiogram  Bone densitometry screening  Colorectal cancer screening  Diabetes screening  Glaucoma screening  Nutrition counseling   Advanced directives: requested   Subjective:  Shannon Nielsen is a 74 y.o. female who presents for Medicare Annual Wellness Visit and 3 month follow up. She is accompanied by her husband, Shannon Nielsen today.  She has history of dementia and follows with Dr Jaynee Nielsen for this.  She is taking aricept and namenda.  She is alert and oriented x2.  She is pleasant with conversation.  She has history of breast cancer (2011) s/p bilateral mastectomy and radiation complete Nov 2011.  She had neuropathy during treatments but has since resolved.  Had glaucoma and cataract surgery at [facility] 09/29/2018 with Shannon Nielsen. She is doing well and has routine eye exams.    BMI is Body mass index is 18.73 kg/m.  a year ago she was 127, she is 119 today. Husband states her appetite has improved and she is eating well.  She has gained some weight since last OV. Wt Readings from Last 3 Encounters:  10/19/19 119 lb 9.6 oz (54.3 kg)  07/07/19 108 lb 12.8 oz (49.4 kg)  04/06/19 109 lb 3.2 oz (49.5 kg)   Her blood pressure has  been controlled at home, today their BP is BP: 112/80  She does not workout but her husband reports he knows they need to start walking together.  She reports her neighbor wants to walk with her and she will start this.  She denies chest pain,  shortness of breath, dizziness.  She is on cholesterol medication and denies myalgias. Her cholesterol is at goal. The cholesterol last visit was:   Lab Results  Component Value Date   CHOL 174 07/07/2019   HDL 75 07/07/2019   LDLCALC 86 07/07/2019   TRIG 58 07/07/2019   CHOLHDL 2.3 07/07/2019   She has a history of preDM but has decreased sugar and has increased water. Last A1C in the office was:  Lab Results  Component Value Date   HGBA1C 5.8 (H) 07/07/2019   She has history of CKD, last OV in normal range. Last GFR: Lab Results  Component Value Date   GFRAA 66 10/19/2019   Patient is on Vitamin D supplement.   Lab Results  Component Value Date   VD25OH 102 (H) 07/07/2019     BMI is Body mass index is 18.73 kg/m., Wt Readings from Last 10 Encounters:  10/19/19 119 lb 9.6 oz (54.3 kg)  07/07/19 108 lb 12.8 oz (49.4 kg)  04/06/19 109 lb 3.2 oz (49.5 kg)  02/23/19 111 lb 3.2 oz (50.4 kg)  12/31/18 106 lb 12.8 oz (48.4 kg)  10/01/18 116 lb (52.6 kg)  08/13/18 112 lb (50.8 kg)  08/10/18 116 lb (52.6 kg)  06/24/18 115 lb (52.2 kg)  03/03/18 119 lb (54 kg)    Medication Review: Current Outpatient Medications on File Prior to Visit  Medication Sig Dispense Refill  . aspirin 81 MG tablet Take 81 mg by mouth daily.     Marland Kitchen atenolol (TENORMIN) 100 MG tablet TAKE 1 TABLET DAILY FOR BLOOD PRESSURE 90 tablet 3  . Cholecalciferol (VITAMIN D3) 1.25 MG (50000 UT) CAPS TAKE ONE CAPSULE THREE DAYS A WEEK FOR TWELVE WEEKS. 36 capsule 0  . donepezil (ARICEPT) 10 MG tablet TAKE 1 TABLET BY MOUTH EVERYDAY AT BEDTIME 90 tablet 2  . Dorzolamide HCl-Timolol Mal PF 22.3-6.8 MG/ML SOLN Apply 1 drop to eye 2 (two) times daily. Left eye    . fluticasone (FLONASE) 50 MCG/ACT nasal spray INSTILL 1 SPRAY INTO EACH NOSTRIL TWICE A DAY 48 mL 3  . latanoprost (XALATAN) 0.005 % ophthalmic solution Place 1 drop into both eyes at bedtime.    Marland Kitchen LATANOPROST OP Apply to eye.    Marland Kitchen letrozole (FEMARA) 2.5 MG  tablet letrozole 2.5 mg tablet    . memantine (NAMENDA) 10 MG tablet TAKE 1 TABLET (10 MG TOTAL) BY MOUTH 2 (TWO) TIMES DAILY FOR MEMORY 180 tablet 4  . mirtazapine (REMERON) 15 MG tablet Take 1 tablet at Bedtime for Appetite 90 tablet 3  . olmesartan (BENICAR) 40 MG tablet 1/2 pill daily for BP 90 tablet 1  . OVER THE COUNTER MEDICATION daily. B-Complex with 1000 mcg Biotin    . quinapril (ACCUPRIL) 20 MG tablet TAKE 1 TABLET BY MOUTH EVERY DAY 90 tablet 1  . rosuvastatin (CRESTOR) 20 MG tablet Take 1 tablet Daily for Cholesterol 90 tablet 3   No current facility-administered medications on file prior to visit.    Allergies  Allergen Reactions  . Minocycline   . Prednisone Swelling  . Sulfa Antibiotics     Patient does not remember the type of reaction, happened years ago.  Marland Kitchen Zithromax [Azithromycin]  Swelling  . Ciprofloxacin Hcl Rash  . Penicillins Rash    Current Problems (verified) Patient Active Problem List   Diagnosis Date Noted  . Mild malnutrition ([place]) 10/01/2018  . Vascular dementia without behavioral disturbance ([place]) 06/22/2018  . B12 deficiency 06/22/2018  . Abnormal glucose 06/22/2018  . Memory changes 10/27/2017  . Iron deficiency 11/30/2016  . Body mass index (BMI) of 19.0-19.9 in adult 05/08/2015  . Open-angle glaucoma 05/08/2015  . Medication management 08/30/2013  . Vitamin D deficiency   . GERD (gastroesophageal reflux disease)   . Hyperparathyroidism, primary ([place]) 08/12/2012  . Breast cancer of upper-outer quadrant of left female breast ([place]) 07/08/2011  . Hyperlipidemia, mixed 10/18/2010  . Essential hypertension 10/18/2010    Screening Tests Immunization History  Administered Date(s) Administered  . Influenza, High Dose Seasonal PF 03/07/2014, 05/08/2015, 03/11/2017, 03/03/2018, 04/06/2019  . PFIZER SARS-COV-2 Vaccination 07/02/2019  . PPD Test 08/30/2013, 10/04/2014, 10/24/2015  . Pneumococcal Conjugate-13 03/24/2014  . Pneumococcal  Polysaccharide-23 09/08/2006, 03/11/2017  . Tdap 08/27/2012  . Zoster 09/03/2012   Preventative care: Last colonoscopy: 2014 Last mammogram: 2011 s/p double mastectomy Last pap smear/pelvic exam: 2015 declines another DEXA:04/2016 osteopenia Stress test 2012  Prior vaccinations: TD or Tdap: 2014  Influenza: 2019 Pneumococcal: 2018 Prevnar13: 2015 Shingles/Zostavax: 2014  Names of Other Physician/Practitioners you currently use: 1. [facility] here for primary care 2. Delman Cheadle, eye doctor, last visit 11/2019 3. Dr. Lilian Coma dentist, last visit Q6 months  Patient Care Team: Shannon Pinto, MD as PCP - General (Internal Medicine) Clarene Essex, MD as Consulting Physician (Gastroenterology) Meda Klinefelter, MD as Consulting Physician (Endocrinology) Mordecai Rasmussen, MD as Consulting Physician (Surgery)  SURGICAL HISTORY She  has a past surgical history that includes Mastectomy (August 2011); Parathyroidectomy; and Nasal sinus surgery. FAMILY HISTORY Her family history includes Alzheimer's disease in her mother; Cancer in her mother; Dementia in her maternal grandfather and maternal grandmother; Diabetes in her mother; Heart attack in her brother; Heart attack (age of onset: 80) in her father. SOCIAL HISTORY She  reports that she has never smoked. She has never used smokeless tobacco. She reports that she does not drink alcohol or use drugs.   MEDICARE WELLNESS OBJECTIVES: Physical activity:   Cardiac risk factors: Cardiac Risk Factors include: advanced age (>46men, >67 women) Depression/mood screen:   Depression screen [facility] 2/9 10/19/2019  Decreased Interest 0  Down, Depressed, Hopeless 0  PHQ - 2 Score 0    ADLs:  In your present state of health, do you have any difficulty performing the following activities: 10/19/2019 07/06/2019  Hearing? N N  Vision? N N  Difficulty concentrating or making decisions? N N  Comment - -  Walking  or climbing stairs? N N  Dressing or bathing? N N  Doing errands, shopping? N [place] and eating ? N -  Using the Toilet? N -  In the past six months, have you accidently leaked urine? N -  Do you have problems with loss of bowel control? N -  Managing your Medications? N -  Managing your Finances? N -  Housekeeping or managing your Housekeeping? N -  Some recent data might be hidden     Cognitive Testing  Alert? Yes  Normal Appearance?Yes  Oriented to person? Yes  Place? Yes   Time? Yes  Recall of three objects?  Yes  Can perform simple calculations? Yes  Displays appropriate judgment?Yes  Can read the  correct time from a watch face?Yes  EOL planning: Does Patient Have a Medical Advance Directive?: Yes Type of Advance Directive: Healthcare Power of Attorney, Living will Does patient want to make changes to medical advance directive?: No - Patient declined(Working on these currently, will bring in once complete.) Copy of [place] in Chart?: No - copy requested  Review of Systems  Constitutional: Negative.   HENT: Negative.   Eyes: Negative.   Respiratory: Negative.   Cardiovascular: Negative.   Gastrointestinal: Negative.   Genitourinary: Negative.   Musculoskeletal: Negative.   Skin: Negative.   Neurological: Negative.   Endo/Heme/Allergies: Negative.   Psychiatric/Behavioral: Negative.      Objective:     Today's Vitals   10/19/19 1008  BP: 112/80  Resp: 16  Temp: (!) 97.2 F (36.2 C)  SpO2: (!) 88%  Weight: 119 lb 9.6 oz (54.3 kg)  Height: 5\' 7"  (1.702 m)   Body mass index is 18.73 kg/m.  General appearance: alert, no distress, WD/WN, female HEENT: normocephalic, sclerae anicteric, TMs pearly, nares patent, no discharge or erythema, pharynx normal Oral cavity: MMM, no lesions Neck: supple, no lymphadenopathy, no thyromegaly, no masses Heart: RRR, normal S1, S2, no murmurs Lungs: CTA bilaterally, no wheezes,  rhonchi, or rales Abdomen: +bs, soft, non tender, non distended, no masses, no hepatomegaly, no splenomegaly Musculoskeletal: nontender, no swelling, no obvious deformity Extremities: no edema, no cyanosis, no clubbing Pulses: 2+ symmetric, upper and lower extremities, normal cap refill Neurological: alert, oriented x 3, CN2-12 intact, strength normal upper extremities and lower extremities, sensation normal throughout, DTRs 2+ throughout, no cerebellar signs, gait normal Psychiatric: normal affect, behavior normal, pleasant   Medicare Attestation I have personally reviewed: The patient's medical and social history Their use of alcohol, tobacco or illicit drugs Their current medications and supplements The patient's functional ability including ADLs,fall risks, home safety risks, cognitive, and hearing and visual impairment Diet and physical activities Evidence for depression or mood disorders  The patient's weight, height, BMI, and visual acuity have been recorded in the chart.  I have made referrals, counseling, and provided education to the patient based on review of the above and I have provided the patient with a written personalized care plan for preventive services.     Shannon Sierras, NP   10/19/2019

## 2019-10-19 NOTE — Patient Instructions (Signed)
Shannon Nielsen , Thank you for taking time to come for your Medicare Wellness Visit. I appreciate your ongoing commitment to your health goals. Please review the following plan we discussed and let me know if I can assist you in the future.   These are the goals we discussed: Goals   Increase activity, walking with neighbor     Due for Bone Density, DEXA, scan    This is a list of the screening recommended for you and due dates:  Health Maintenance  Topic Date Due  . COVID-19 Vaccine (2 - Pfizer 2-dose series) 07/23/2019  . Flu Shot  01/09/2020  . Tetanus Vaccine  08/28/2022  . Colon Cancer Screening  03/30/2023  . DEXA scan (bone density measurement)  Completed  .  Hepatitis C: One time screening is recommended by Center for Disease Control  (CDC) for  adults born from [birth year] through 1963.   Completed  . Pneumonia vaccines  Completed    Due to recent changes in healthcare laws, you may see the results of your imaging and laboratory studies on MyChart before your provider has had a chance to review them.  We understand that in some cases there may be results that are confusing or concerning to you. Not all laboratory results come back in the same time frame and the provider may be waiting for multiple results in order to interpret others.  Please give [place] 48 hours in order for your provider to thoroughly review all the results before contacting the office for clarification of your results.   +++++++++++++++++++++++++  Vit D  & Vit C 1,000 mg   are recommended to help protect  against the Covid-19 and other Corona viruses.    Also it's recommended  to take  Zinc 50 mg  to help  protect against the Covid-19   and best place to get  is also on [email or web address]  and don't pay more than 6-8 cents /pill !  ================================ Coronavirus (COVID-19) Are you at risk?  Are you at risk for the Coronavirus (COVID-19)?  To be considered HIGH RISK for Coronavirus (COVID-19), you have  to meet the following criteria:  . Traveled to [place], [place], [place], [place] or [place]; or in the [place] to [name], [place], [place]  . or [place]; and have fever, cough, and shortness of breath within the last 2 weeks of travel OR . Been in close contact with a person diagnosed with COVID-19 within the last 2 weeks and have  . fever, cough,and shortness of breath .  . IF YOU DO NOT MEET THESE CRITERIA, YOU ARE CONSIDERED LOW RISK FOR COVID-19.  What to do if you are HIGH RISK for COVID-19?  Marland Kitchen If you are having a medical emergency, call 911. . Seek medical care right away. Before you go to a doctor's office, urgent care or emergency department, .  call ahead and tell them about your recent travel, contact with someone diagnosed with COVID-19  .  and your symptoms.  . You should receive instructions from your physician's office regarding next steps of care.  . When you arrive at healthcare provider, tell the healthcare staff immediately you have returned from  . visiting [place], [place], [place], [place] or [place]; or traveled in the [place] to [place], [place],  . [place] or [place] in the last two weeks or you have been in close contact with a person diagnosed with  . COVID-19 in  the last 2 weeks.   . Tell the health care staff about your symptoms: fever, cough and shortness of breath. . After you have been seen by a medical provider, you will be either: o Tested for (COVID-19) and discharged home on quarantine except to seek medical care if  o symptoms worsen, and asked to  - Stay home and avoid contact with others until you get your results (4-5 days)  - Avoid travel on public transportation if possible (such as bus, train, or airplane) or o Sent to the Emergency Department by EMS for evaluation, COVID-19 testing  and  o possible admission depending on your condition and test results.  What to do if you are LOW RISK for COVID-19?  Reduce your risk  of any infection by using the same precautions used for avoiding the common cold or flu:  Marland Kitchen Wash your hands often with soap and warm water for at least 20 seconds.  If soap and water are not readily available,  . use an alcohol-based hand sanitizer with at least 60% alcohol.  . If coughing or sneezing, cover your mouth and nose by coughing or sneezing into the elbow areas of your shirt or coat, .  into a tissue or into your sleeve (not your hands). . Avoid shaking hands with others and consider head nods or verbal greetings only. . Avoid touching your eyes, nose, or mouth with unwashed hands.  . Avoid close contact with people who are sick. . Avoid places or events with large numbers of people in one location, like concerts or sporting events. . Carefully consider travel plans you have or are making. . If you are planning any travel outside or inside the [place], visit the CDC's Travelers' Health webpage for the latest health notices. . If you have some symptoms but not all symptoms, continue to monitor at home and seek medical attention  . if your symptoms worsen. . If you are having a medical emergency, call 911.   . >>>>>>>>>>>>>>>>>>>>>>>>>>>>>>>>> . We Do NOT Approve of  [facility], [employer]  Our Patients  To Do Home Visits & We Do NOT Approve of [employer] > > > > > > > > > > > > > > > > > > > > > > > > > > > > > > > > > > > > > > >  Preventive Care for Adults  A healthy lifestyle and preventive care can promote health and wellness. Preventive health guidelines for women include the following key practices.  A routine yearly physical is a good way to check with your health care provider about your health and preventive screening. It is a chance to share any concerns and updates on your health and to receive a thorough exam.  Visit your dentist for a routine exam and preventive care every 6 months. Brush your teeth twice a day and floss once a day. Good  oral hygiene prevents tooth decay and gum disease.  The frequency of eye exams is based on your age, health, family medical history, use of contact lenses, and other factors. Follow your health care provider's recommendations for frequency of eye exams.  Eat a healthy diet. Foods like vegetables, fruits, whole grains, low-fat dairy products, and lean protein foods contain the nutrients you need without too many calories. Decrease your intake of foods high in solid fats, added sugars, and salt. Eat the right amount of calories for you. Get information about a proper diet from  your health care provider, if necessary.  Regular physical exercise is one of the most important things you can do for your health. Most adults should get at least 150 minutes of moderate-intensity exercise (any activity that increases your heart rate and causes you to sweat) each week. In addition, most adults need muscle-strengthening exercises on 2 or more days a week.  Maintain a healthy weight. The body mass index (BMI) is a screening tool to identify possible weight problems. It provides an estimate of body fat based on height and weight. Your health care provider can find your BMI and can help you achieve or maintain a healthy weight. For adults 20 years and older:  A BMI below 18.5 is considered underweight.  A BMI of 18.5 to 24.9 is normal.  A BMI of 25 to 29.9 is considered overweight.  A BMI of 30 and above is considered obese.  Maintain normal blood lipids and cholesterol levels by exercising and minimizing your intake of saturated fat. Eat a balanced diet with plenty of fruit and vegetables. If your lipid or cholesterol levels are high, you are over 50, or you are at high risk for heart disease, you may need your cholesterol levels checked more frequently. Ongoing high lipid and cholesterol levels should be treated with medicines if diet and exercise are not working.  If you smoke, find out from your health care  provider how to quit. If you do not use tobacco, do not start.  Lung cancer screening is recommended for adults aged 11-80 years who are at high risk for developing lung cancer because of a history of smoking. A yearly low-dose CT scan of the lungs is recommended for people who have at least a 30-pack-year history of smoking and are a current smoker or have quit within the past 15 years. A pack year of smoking is smoking an average of 1 pack of cigarettes a day for 1 year (for example: 1 pack a day for 30 years or 2 packs a day for 15 years). Yearly screening should continue until the smoker has stopped smoking for at least 15 years. Yearly screening should be stopped for people who develop a health problem that would prevent them from having lung cancer treatment.  Avoid use of street drugs. Do not share needles with anyone. Ask for help if you need support or instructions about stopping the use of drugs.  High blood pressure causes heart disease and increases the risk of stroke.  Ongoing high blood pressure should be treated with medicines if weight loss and exercise do not work.  If you are 75-34 years old, ask your health care provider if you should take aspirin to prevent strokes.  Diabetes screening involves taking a blood sample to check your fasting blood sugar level. This should be done once every 3 years, after age 38, if you are within normal weight and without risk factors for diabetes. Testing should be considered at a younger age or be carried out more frequently if you are overweight and have at least 1 risk factor for diabetes.  Breast cancer screening is essential preventive care for women. You should practice "breast self-awareness." This means understanding the normal appearance and feel of your breasts and may include breast self-examination. Any changes detected, no matter how small, should be reported to a health care provider. Women in their 41s and 30s should have a clinical  breast exam (CBE) by a health care provider as part of a regular health exam  every 1 to 3 years. After age 28, women should have a CBE every year. Starting at age 71, women should consider having a mammogram (breast X-ray test) every year. Women who have a family history of breast cancer should talk to their health care provider about genetic screening. Women at a high risk of breast cancer should talk to their health care providers about having an MRI and a mammogram every year.  Breast cancer gene (BRCA)-related cancer risk assessment is recommended for women who have family members with BRCA-related cancers. BRCA-related cancers include breast, ovarian, tubal, and peritoneal cancers. Having family members with these cancers may be associated with an increased risk for harmful changes (mutations) in the breast cancer genes BRCA1 and BRCA2. Results of the assessment will determine the need for genetic counseling and BRCA1 and BRCA2 testing.  Routine pelvic exams to screen for cancer are no longer recommended for nonpregnant women who are considered low risk for cancer of the pelvic organs (ovaries, uterus, and vagina) and who do not have symptoms. Ask your health care provider if a screening pelvic exam is right for you.  If you have had past treatment for cervical cancer or a condition that could lead to cancer, you need Pap tests and screening for cancer for at least 20 years after your treatment. If Pap tests have been discontinued, your risk factors (such as having a new sexual partner) need to be reassessed to determine if screening should be resumed. Some women have medical problems that increase the chance of getting cervical cancer. In these cases, your health care provider may recommend more frequent screening and Pap tests.    Colorectal cancer can be detected and often prevented. Most routine colorectal cancer screening begins at the age of 76 years and continues through age 1 years. However,  your health care provider may recommend screening at an earlier age if you have risk factors for colon cancer. On a yearly basis, your health care provider may provide home test kits to check for hidden blood in the stool. Use of a small camera at the end of a tube, to directly examine the colon (sigmoidoscopy or colonoscopy), can detect the earliest forms of colorectal cancer. Talk to your health care provider about this at age 32, when routine screening begins.  Direct exam of the colon should be repeated every 5-10 years through age 28 years, unless early forms of pre-cancerous polyps or small growths are found.  Osteoporosis is a disease in which the bones lose minerals and strength with aging. This can result in serious bone fractures or breaks. The risk of osteoporosis can be identified using a bone density scan. Women ages 25 years and over and women at risk for fractures or osteoporosis should discuss screening with their health care providers. Ask your health care provider whether you should take a calcium supplement or vitamin D to reduce the rate of osteoporosis.  Menopause can be associated with physical symptoms and risks. Hormone replacement therapy is available to decrease symptoms and risks. You should talk to your health care provider about whether hormone replacement therapy is right for you.  Use sunscreen. Apply sunscreen liberally and repeatedly throughout the day. You should seek shade when your shadow is shorter than you. Protect yourself by wearing long sleeves, pants, a wide-brimmed hat, and sunglasses year round, whenever you are outdoors.  Once a month, do a whole body skin exam, using a mirror to look at the skin on your back. Tell your  health care provider of new moles, moles that have irregular borders, moles that are larger than a pencil eraser, or moles that have changed in shape or color.  Stay current with required vaccines (immunizations).  Influenza vaccine. All adults  should be immunized every year.  Tetanus, diphtheria, and acellular pertussis (Td, Tdap) vaccine. Pregnant women should receive 1 dose of Tdap vaccine during each pregnancy. The dose should be obtained regardless of the length of time since the last dose. Immunization is preferred during the 27th-36th week of gestation. An adult who has not previously received Tdap or who does not know her vaccine status should receive 1 dose of Tdap. This initial dose should be followed by tetanus and diphtheria toxoids (Td) booster doses every 10 years. Adults with an unknown or incomplete history of completing a 3-dose immunization series with Td-containing vaccines should begin or complete a primary immunization series including a Tdap dose. Adults should receive a Td booster every 10 years.    Zoster vaccine. One dose is recommended for adults aged 54 years or older unless certain conditions are present.    Pneumococcal 13-valent conjugate (PCV13) vaccine. When indicated, a person who is uncertain of her immunization history and has no record of immunization should receive the PCV13 vaccine. An adult aged 61 years or older who has certain medical conditions and has not been previously immunized should receive 1 dose of PCV13 vaccine. This PCV13 should be followed with a dose of pneumococcal polysaccharide (PPSV23) vaccine. The PPSV23 vaccine dose should be obtained at least 1 or more year(s) after the dose of PCV13 vaccine. An adult aged 59 years or older who has certain medical conditions and previously received 1 or more doses of PPSV23 vaccine should receive 1 dose of PCV13. The PCV13 vaccine dose should be obtained 1 or more years after the last PPSV23 vaccine dose.    Pneumococcal polysaccharide (PPSV23) vaccine. When PCV13 is also indicated, PCV13 should be obtained first. All adults aged 82 years and older should be immunized. An adult younger than age 57 years who has certain medical conditions should be  immunized. Any person who resides in a nursing home or long-term care facility should be immunized. An adult smoker should be immunized. People with an immunocompromised condition and certain other conditions should receive both PCV13 and PPSV23 vaccines. People with human immunodeficiency virus (HIV) infection should be immunized as soon as possible after diagnosis. Immunization during chemotherapy or radiation therapy should be avoided. Routine use of PPSV23 vaccine is not recommended for American Indians, [place], or people younger than 65 years unless there are medical conditions that require PPSV23 vaccine. When indicated, people who have unknown immunization and have no record of immunization should receive PPSV23 vaccine. One-time revaccination 5 years after the first dose of PPSV23 is recommended for people aged 19-64 years who have chronic kidney failure, nephrotic syndrome, asplenia, or immunocompromised conditions. People who received 1-2 doses of PPSV23 before age 53 years should receive another dose of PPSV23 vaccine at age 40 years or later if at least 5 years have passed since the previous dose. Doses of PPSV23 are not needed for people immunized with PPSV23 at or after age 83 years.   Preventive Services / Frequency  Ages 4 years and over  Blood pressure check.  Lipid and cholesterol check.  Lung cancer screening. / Every year if you are aged 57-80 years and have a 30-pack-year history of smoking and currently smoke or have quit within the past  15 years. Yearly screening is stopped once you have quit smoking for at least 15 years or develop a health problem that would prevent you from having lung cancer treatment.  Clinical breast exam.** / Every year after age 76 years.   BRCA-related cancer risk assessment.** / For women who have family members with a BRCA-related cancer (breast, ovarian, tubal, or peritoneal cancers).  Mammogram.** / Every year beginning at age 66 years  and continuing for as long as you are in good health. Consult with your health care provider.  Pap test.** / Every 3 years starting at age 78 years through age 66 or 45 years with 3 consecutive normal Pap tests. Testing can be stopped between 65 and 70 years with 3 consecutive normal Pap tests and no abnormal Pap or HPV tests in the past 10 years.  Fecal occult blood test (FOBT) of stool. / Every year beginning at age 11 years and continuing until age 37 years. You may not need to do this test if you get a colonoscopy every 10 years.  Flexible sigmoidoscopy or colonoscopy.** / Every 5 years for a flexible sigmoidoscopy or every 10 years for a colonoscopy beginning at age 18 years and continuing until age 54 years.  Hepatitis C blood test.** / For all people born from [birth year] through 1963 and any individual with known risks for hepatitis C.  Osteoporosis screening.** / A one-time screening for women ages 44 years and over and women at risk for fractures or osteoporosis.  Skin self-exam. / Monthly.  Influenza vaccine. / Every year.  Tetanus, diphtheria, and acellular pertussis (Tdap/Td) vaccine.** / 1 dose of Td every 10 years.  Zoster vaccine.** / 1 dose for adults aged 18 years or older.  Pneumococcal 13-valent conjugate (PCV13) vaccine.** / Consult your health care provider.  Pneumococcal polysaccharide (PPSV23) vaccine.** / 1 dose for all adults aged 31 years and older. Screening for abdominal aortic aneurysm (AAA)  by ultrasound is recommended for people who have history of high blood pressure or who are current or former smokers. ++++++++++++++++++++ Recommend Adult Low Dose Aspirin or  coated  Aspirin 81 mg daily  To reduce risk of Colon Cancer 40 %,  Skin Cancer 26 % ,  Melanoma 46%  and  Pancreatic cancer 60% ++++++++++++++++++++ Vitamin D goal  is between 70-100.  Please make sure that you are taking your Vitamin D as directed.  It is very important as a natural  anti-inflammatory  helping hair, skin, and nails, as well as reducing stroke and heart attack risk.  It helps your bones and helps with mood. It also decreases numerous cancer risks so please take it as directed.  Low Vit D is associated with a 200-300% higher risk for CANCER  and 200-300% higher risk for HEART   ATTACK  &  STROKE.   .....................................Marland Kitchen It is also associated with higher death rate at younger ages,  autoimmune diseases like Rheumatoid arthritis, Lupus, Multiple Sclerosis.    Also many other serious conditions, like depression, Alzheimer's Dementia, infertility, muscle aches, fatigue, fibromyalgia - just to name a few. ++++++++++++++++++ Recommend the book "The END of DIETING" by Dr Excell Seltzer  & the book "The END of DIABETES " by Dr Excell Seltzer At [email or web address] - get book & Audio CD's    Being diabetic has a  300% increased risk for heart attack, stroke, cancer, and alzheimer- type vascular dementia. It is very important that you work harder with diet by avoiding all foods that are  white. Avoid white rice (brown & wild rice is OK), white potatoes (sweetpotatoes in moderation is OK), White bread or wheat bread or anything made out of white flour like bagels, donuts, rolls, buns, biscuits, cakes, pastries, cookies, pizza crust, and pasta (made from white flour & egg whites) - vegetarian pasta or spinach or wheat pasta is OK. Multigrain breads like Arnold's or [employer], or multigrain sandwich thins or flatbreads.  Diet, exercise and weight loss can reverse and cure diabetes in the early stages.  Diet, exercise and weight loss is very important in the control and prevention of complications of diabetes which affects every system in your body, ie. Brain - dementia/stroke, eyes - glaucoma/blindness, heart - heart attack/heart failure, kidneys - dialysis, stomach - gastric paralysis, intestines - malabsorption, nerves - severe painful neuritis, circulation - gangrene  & loss of a leg(s), and finally cancer and Alzheimers.    I recommend avoid fried & greasy foods,  sweets/candy, white rice (brown or wild rice or Quinoa is OK), white potatoes (sweet potatoes are OK) - anything made from white flour - bagels, doughnuts, rolls, buns, biscuits,white and wheat breads, pizza crust and traditional pasta made of white flour & egg white(vegetarian pasta or spinach or wheat pasta is OK).  Multi-grain bread is OK - like multi-grain flat bread or sandwich thins. Avoid alcohol in excess. Exercise is also important.    Eat all the vegetables you want - avoid meat, especially red meat and dairy - especially cheese.  Cheese is the most concentrated form of trans-fats which is the worst thing to clog up our arteries. Veggie cheese is OK which can be found in the fresh produce section at [employer] or [employer] or [employer]  +++++++++++++++++++ DASH Eating Plan  DASH stands for "Dietary Approaches to Stop Hypertension."   The DASH eating plan is a healthy eating plan that has been shown to reduce high blood pressure (hypertension). Additional health benefits may include reducing the risk of type 2 diabetes mellitus, heart disease, and stroke. The DASH eating plan may also help with weight loss. WHAT DO I NEED TO KNOW ABOUT THE DASH EATING PLAN? For the DASH eating plan, you will follow these general guidelines:  Choose foods with a percent daily value for sodium of less than 5% (as listed on the food label).  Use salt-free seasonings or herbs instead of table salt or sea salt.  Check with your health care provider or pharmacist before using salt substitutes.  Eat lower-sodium products, often labeled as "lower sodium" or "no salt added."  Eat fresh foods.  Eat more vegetables, fruits, and low-fat dairy products.  Choose whole grains. Look for the word "whole" as the first word in the ingredient list.  Choose fish   Limit sweets, desserts, sugars, and sugary  drinks.  Choose heart-healthy fats.  Eat veggie cheese   Eat more home-cooked food and less restaurant, buffet, and fast food.  Limit fried foods.  Cook foods using methods other than frying.  Limit canned vegetables. If you do use them, rinse them well to decrease the sodium.  When eating at a restaurant, ask that your food be prepared with less salt, or no salt if possible.                      WHAT FOODS CAN I EAT? Read Dr Fara Olden Fuhrman's books on The End of Dieting & The End of Diabetes  Grains Whole grain or whole wheat bread.  Brown rice. Whole grain or whole wheat pasta. Quinoa, bulgur, and whole grain cereals. Low-sodium cereals. Corn or whole wheat flour tortillas. Whole grain cornbread. Whole grain crackers. Low-sodium crackers.  Vegetables Fresh or frozen vegetables (raw, steamed, roasted, or grilled). Low-sodium or reduced-sodium tomato and vegetable juices. Low-sodium or reduced-sodium tomato sauce and paste. Low-sodium or reduced-sodium canned vegetables.   Fruits All fresh, canned (in natural juice), or frozen fruits.  Protein Products  All fish and seafood.  Dried beans, peas, or lentils. Unsalted nuts and seeds. Unsalted canned beans.  Dairy Low-fat dairy products, such as skim or 1% milk, 2% or reduced-fat cheeses, low-fat ricotta or cottage cheese, or plain low-fat yogurt. Low-sodium or reduced-sodium cheeses.  Fats and Oils Tub margarines without trans fats. Light or reduced-fat mayonnaise and salad dressings (reduced sodium). Avocado. Safflower, olive, or canola oils. Natural peanut or almond butter.  Other Unsalted popcorn and pretzels. The items listed above may not be a complete list of recommended foods or beverages. Contact your dietitian for more options.  +++++++++++++++  WHAT FOODS ARE NOT RECOMMENDED? Grains/ White flour or wheat flour White bread. White pasta. White rice. Refined cornbread. Bagels and croissants. Crackers that contain trans  fat.  Vegetables  Creamed or fried vegetables. Vegetables in a . Regular canned vegetables. Regular canned tomato sauce and paste. Regular tomato and vegetable juices.  Fruits Dried fruits. Canned fruit in light or heavy syrup. Fruit juice.  Meat and Other Protein Products Meat in general - RED meat & White meat.  Fatty cuts of meat. Ribs, chicken wings, all processed meats as bacon, sausage, bologna, salami, fatback, hot dogs, bratwurst and packaged luncheon meats.  Dairy Whole or 2% milk, cream, half-and-half, and cream cheese. Whole-fat or sweetened yogurt. Full-fat cheeses or blue cheese. Non-dairy creamers and whipped toppings. Processed cheese, cheese spreads, or cheese curds.  Condiments Onion and garlic salt, seasoned salt, table salt, and sea salt. Canned and packaged gravies. Worcestershire sauce. Tartar sauce. Barbecue sauce. Teriyaki sauce. Soy sauce, including reduced sodium. Steak sauce. Fish sauce. Oyster sauce. Cocktail sauce. Horseradish. Ketchup and mustard. Meat flavorings and tenderizers. Bouillon cubes. Hot sauce. Tabasco sauce. Marinades. Taco seasonings. Relishes.  Fats and Oils Butter, stick margarine, lard, shortening and bacon fat. Coconut, palm kernel, or palm oils. Regular salad dressings.  Pickles and olives. Salted popcorn and pretzels.  The items listed above may not be a complete list of foods and beverages to avoid.

## 2019-10-20 ENCOUNTER — Other Ambulatory Visit: Payer: Self-pay | Admitting: Adult Health Nurse Practitioner

## 2019-10-28 DIAGNOSIS — Z833 Family history of diabetes mellitus: Secondary | ICD-10-CM | POA: Diagnosis not present

## 2019-10-28 DIAGNOSIS — Z9181 History of falling: Secondary | ICD-10-CM | POA: Diagnosis not present

## 2019-10-28 DIAGNOSIS — E785 Hyperlipidemia, unspecified: Secondary | ICD-10-CM | POA: Diagnosis not present

## 2019-10-28 DIAGNOSIS — Z853 Personal history of malignant neoplasm of breast: Secondary | ICD-10-CM | POA: Diagnosis not present

## 2019-10-28 DIAGNOSIS — R69 Illness, unspecified: Secondary | ICD-10-CM | POA: Diagnosis not present

## 2019-10-28 DIAGNOSIS — Z803 Family history of malignant neoplasm of breast: Secondary | ICD-10-CM | POA: Diagnosis not present

## 2019-10-28 DIAGNOSIS — H409 Unspecified glaucoma: Secondary | ICD-10-CM | POA: Diagnosis not present

## 2019-10-28 DIAGNOSIS — I10 Essential (primary) hypertension: Secondary | ICD-10-CM | POA: Diagnosis not present

## 2019-10-28 DIAGNOSIS — Z008 Encounter for other general examination: Secondary | ICD-10-CM | POA: Diagnosis not present

## 2019-10-28 MED ORDER — QUINAPRIL HCL 20 MG PO TABS: 20.0000 mg | ORAL_TABLET | Freq: Every day | ORAL | 1 refills | Status: DC

## 2019-10-28 NOTE — Addendum Note (Signed)
Addended by: Garnet Sierras A on: 10/28/2019 08:35 AM   Modules accepted: Orders

## 2019-12-30 DIAGNOSIS — Z961 Presence of intraocular lens: Secondary | ICD-10-CM | POA: Diagnosis not present

## 2019-12-30 DIAGNOSIS — H401111 Primary open-angle glaucoma, right eye, mild stage: Secondary | ICD-10-CM | POA: Diagnosis not present

## 2020-01-06 ENCOUNTER — Other Ambulatory Visit: Payer: Self-pay

## 2020-01-06 ENCOUNTER — Ambulatory Visit
Admission: RE | Admit: 2020-01-06 | Discharge: 2020-01-06 | Disposition: A | Discharge status: Home / Self Care | Payer: Medicare HMO | Source: Ambulatory Visit | Attending: Adult Health Nurse Practitioner | Admitting: Adult Health Nurse Practitioner

## 2020-01-06 DIAGNOSIS — M8589 Other specified disorders of bone density and structure, multiple sites: Secondary | ICD-10-CM | POA: Diagnosis not present

## 2020-01-06 DIAGNOSIS — M85851 Other specified disorders of bone density and structure, right thigh: Secondary | ICD-10-CM

## 2020-01-06 DIAGNOSIS — Z780 Asymptomatic menopausal state: Secondary | ICD-10-CM | POA: Diagnosis not present

## 2020-01-07 ENCOUNTER — Encounter: Payer: Medicare HMO | Admitting: Internal Medicine

## 2020-01-14 ENCOUNTER — Encounter: Payer: Medicare HMO | Admitting: Internal Medicine

## 2020-01-20 ENCOUNTER — Other Ambulatory Visit: Payer: Self-pay | Admitting: *Deleted

## 2020-01-20 ENCOUNTER — Other Ambulatory Visit: Payer: Self-pay | Admitting: Neurology

## 2020-01-20 DIAGNOSIS — F0280 Dementia in other diseases classified elsewhere without behavioral disturbance: Secondary | ICD-10-CM

## 2020-01-20 DIAGNOSIS — G301 Alzheimer's disease with late onset: Secondary | ICD-10-CM

## 2020-01-20 MED ORDER — DONEPEZIL HCL 10 MG PO TABS: ORAL_TABLET | ORAL | 2 refills | Status: DC

## 2020-01-27 ENCOUNTER — Encounter: Payer: Self-pay | Admitting: Genetic Counselor

## 2020-01-31 ENCOUNTER — Other Ambulatory Visit: Payer: Self-pay | Admitting: Internal Medicine

## 2020-01-31 ENCOUNTER — Encounter: Payer: Self-pay | Admitting: Internal Medicine

## 2020-01-31 DIAGNOSIS — I10 Essential (primary) hypertension: Secondary | ICD-10-CM

## 2020-01-31 NOTE — Patient Instructions (Signed)

## 2020-01-31 NOTE — Progress Notes (Signed)
Annual  Screening/Preventative Visit  & Comprehensive Evaluation & Examination     This very nice 74 y.o.  MBF presents for a Screening /Preventative Visit & comprehensive evaluation and management of multiple medical co-morbidities.  Patient has been followed for HTN, HLD, Prediabetes and Vitamin D Deficiency. Patient has GERD controlled with diet. Patient also has Vascular Dementia & is followed by Dr [name]. Patienty is on Aricept/Namenda. Still capable of ADLs / personal hygiene, but requires moderate supervision & guidance per spouse.  Patient had excision of a parathyroid adenoma in 2001 for primary hyperparathyroidism.      HTN predates circa 1990. Patient's BP has been controlled at home.  Today's BP is at goal -  136/82. Patient denies any cardiac symptoms as chest pain, palpitations, shortness of breath, dizziness or ankle swelling.      Patient's hyperlipidemia is controlled with diet and  Rosuvastatin. Patient denies myalgias or other medication SE's. Last lipids were at goal:  Lab Results  Component Value Date   CHOL 174 07/07/2019   HDL 75 07/07/2019   LDLCALC 86 07/07/2019   TRIG 58 07/07/2019   CHOLHDL 2.3 07/07/2019       Patient has hx/o prediabetes (A1c 6.0%/2011)  and patient denies reactive hypoglycemic symptoms, visual blurring, diabetic polys or paresthesias. Last A1c was near goal:  Lab Results  Component Value Date   HGBA1C 5.8 (H) 07/07/2019        Finally, patient has history of Vitamin D Deficiency ("13"/2008)  and last vitamin D was at goal:  Lab Results  Component Value Date   VD25OH 102 (H) 07/07/2019     Current Outpatient Medications on File Prior to Visit  Medication Sig  . aspirin 81 MG tablet Take 81 mg by mouth daily.   Marland Kitchen atenolol (TENORMIN) 100 MG tablet Take 1 tablet by mouth once daily for blood pressure  . Cholecalciferol (VITAMIN D3) 1.25 MG (50000 UT) CAPS TAKE ONE CAPSULE THREE DAYS A WEEK FOR TWELVE WEEKS.  . donepezil  (ARICEPT) 10 MG tablet TAKE 1 TABLET BY MOUTH EVERYDAY AT BEDTIME  . Dorzolamide HCl-Timolol Mal PF 22.3-6.8 MG/ML SOLN Apply 1 drop to eye 2 (two) times daily. Left eye  . fluticasone (FLONASE) 50 MCG/ACT nasal spray INSTILL 1 SPRAY INTO EACH NOSTRIL TWICE A DAY  . latanoprost (XALATAN) 0.005 % ophthalmic solution Place 1 drop into both eyes at bedtime.  Marland Kitchen letrozole (FEMARA) 2.5 MG tablet letrozole 2.5 mg tablet  . memantine (NAMENDA) 10 MG tablet TAKE 1 TABLET (10 MG TOTAL) BY MOUTH 2 (TWO) TIMES DAILY FOR MEMORY  . mirtazapine (REMERON) 15 MG tablet Take 1 tablet at Bedtime for Appetite  . OVER THE COUNTER MEDICATION daily. B-Complex with 1000 mcg Biotin  . quinapril (ACCUPRIL) 20 MG tablet Take 1 tablet (20 mg total) by mouth daily.  . rosuvastatin (CRESTOR) 20 MG tablet Take 1 tablet by mouth once daily   No current facility-administered medications on file prior to visit.   Allergies  Allergen Reactions  . Minocycline   . Prednisone Swelling  . Sulfa Antibiotics     Patient does not remember the type of reaction, happened years ago.  . Zithromax [Azithromycin] Swelling  . Ciprofloxacin Hcl Rash  . Penicillins Rash   Past Medical History:  Diagnosis Date  . Breast cancer ([place])   . Chest tightness   . GERD (gastroesophageal reflux disease)   . Hepatitis C virus screening Negative 08/31/2012 03/08/2018  . Hyperlipidemia   . Hypertension   .  Palpitations   . Prediabetes   . Vitamin D deficiency    Health Maintenance  Topic Date Due  . COVID-19 Vaccine (2 - Pfizer 2-dose series) 07/23/2019  . INFLUENZA VACCINE  01/09/2020  . TETANUS/TDAP  08/28/2022  . COLONOSCOPY  03/30/2023  . DEXA SCAN  Completed  . Hepatitis C Screening  Completed  . PNA vac Low Risk Adult  Completed   Immunization History  Administered Date(s) Administered  . Influenza, High Dose Seasonal PF 03/07/2014, 05/08/2015, 03/11/2017, 03/03/2018, 04/06/2019  . PFIZER SARS-COV-2 Vaccination 07/02/2019  .  PPD Test 08/30/2013, 10/04/2014, 10/24/2015  . Pneumococcal Conjugate-13 03/24/2014  . Pneumococcal Polysaccharide-23 09/08/2006, 03/11/2017  . Tdap 08/27/2012  . Zoster 09/03/2012   Last Colon - 04/02/2013 - Dr Watt Climes - recc 5 yr F/U - overdue  Last MGM -  Last 2015 - s/p Bilat Mastectomies 2011  Past Surgical History:  Procedure Laterality Date  . MASTECTOMY  August 2011   Bilateral  . NASAL SINUS SURGERY    . PARATHYROIDECTOMY     Family History  Problem Relation Age of Onset  . Diabetes Mother   . Cancer Mother        breast  . Alzheimer's disease Mother   . Heart attack Father 73  . Heart attack Brother   . Dementia Maternal Grandmother   . Dementia Maternal Grandfather    Social History   Socioeconomic History  . Marital status: Married    Spouse name: [name]  . Number of children: 1  . Years of education: Not on file  . Highest education level: Bachelor's degree (e.g., BA, AB, BS)  Occupational History  . Not on file  Tobacco Use  . Smoking status: Never Smoker  . Smokeless tobacco: Never Used  Vaping Use  . Vaping Use: Never used  Substance and Sexual Activity  . Alcohol use: No  . Drug use: Never  . Sexual activity: Not on file     ROS Constitutional: Denies fever, chills, weight loss/gain, headaches, insomnia,  night sweats or change in appetite. Does c/o fatigue. Eyes: Denies redness, blurred vision, diplopia, discharge, itchy or watery eyes.  ENT: Denies discharge, congestion, post nasal drip, epistaxis, sore throat, earache, hearing loss, dental pain, Tinnitus, Vertigo, Sinus pain or snoring.  Cardio: Denies chest pain, palpitations, irregular heartbeat, syncope, dyspnea, diaphoresis, orthopnea, PND, claudication or edema Respiratory: denies cough, dyspnea, DOE, pleurisy, hoarseness, laryngitis or wheezing.  Gastrointestinal: Denies dysphagia, heartburn, reflux, water brash, pain, cramps, nausea, vomiting, bloating, diarrhea, constipation, hematemesis,  melena, hematochezia, jaundice or hemorrhoids Genitourinary: Denies dysuria, frequency, urgency, nocturia, hesitancy, discharge, hematuria or flank pain Musculoskeletal: Denies arthralgia, myalgia, stiffness, Jt. Swelling, pain, limp or strain/sprain. Denies Falls. Skin: Denies puritis, rash, hives, warts, acne, eczema or change in skin lesion Neuro: No weakness, tremor, incoordination, spasms, paresthesia or pain Psychiatric: Denies confusion or sensory loss. Denies Depression. Endocrine: Denies change in weight, skin, hair change, nocturia, and paresthesia, diabetic polys, visual blurring or hyper / hypo glycemic episodes.  Heme/Lymph: No excessive bleeding, bruising or enlarged lymph nodes.  Physical Exam  BP 136/82   Pulse (!) 56   Temp (!) 97.1 F (36.2 C)   Resp 16   Ht 5\' 6"  (1.676 m)   Wt 123 lb 6.4 oz (56 kg)   BMI 19.92 kg/m   General Appearance: Well nourished and well groomed and in no apparent distress.  Eyes: PERRLA, EOMs, conjunctiva no swelling or erythema, normal fundi and vessels. Sinuses: No frontal/maxillary tenderness ENT/Mouth: EACs  patent / TMs  nl. Nares clear without erythema, swelling, mucoid exudates. Oral hygiene is good. No erythema, swelling, or exudate. Tongue normal, non-obstructing. Tonsils not swollen or erythematous. Hearing normal.  Neck: Supple, thyroid not palpable. No bruits, nodes or JVD. Respiratory: Respiratory effort normal.  BS equal and clear bilateral without rales, rhonci, wheezing or stridor. Cardio: Heart sounds are normal with regular rate and rhythm and no murmurs, rubs or gallops. Peripheral pulses are normal and equal bilaterally without edema. No aortic or femoral bruits. Chest: symmetric with normal excursions and percussion.  Breasts: Surgically absent w/o signs of chest wall abnormaliities Abdomen: Soft, with Nl bowel sounds. Nontender, no guarding, rebound, hernias, masses, or organomegaly.  Lymphatics: Non tender without  lymphadenopathy.  Musculoskeletal: Full ROM all peripheral extremities, joint stability, 5/5 strength, and normal gait. Skin: Warm and dry without rashes, lesions, cyanosis, clubbing or  ecchymosis.  Neuro: Cranial nerves intact, reflexes equal bilaterally. Normal muscle tone, no cerebellar symptoms. Sensation intact.  Pysch: Alert and oriented X 3 with normal affect, insight and judgment appropriate.   Assessment and Plan  1. Annual Preventative/Screening Exam    2. Essential hypertension  - EKG 12-Lead - Urinalysis, Routine w reflex microscopic - Microalbumin / creatinine urine ratio - CBC with Differential/Platelet - COMPLETE METABOLIC PANEL WITH GFR - Magnesium - TSH  3. Hyperlipidemia, mixed  - EKG 12-Lead - Lipid panel - TSH  4. Abnormal glucose  - EKG 12-Lead - Hemoglobin A1c - Insulin, random  5. Vitamin D deficiency  - VITAMIN D 25 Hydroxy  6. Vascular dementia without behavioral disturbance ([place])   7. Gastroesophageal reflux disease  - CBC with Differential/Platelet  8. Prediabetes  - EKG 12-Lead  9. Screening for colorectal cancer  - POC Hemoccult Bld/Stl   10. Screening for ischemic heart disease  - EKG 12-Lead  11. FHx: heart disease  - EKG 12-Lead  12. Medication management  - Urinalysis, Routine w reflex microscopic - Microalbumin / creatinine urine ratio - CBC with Differential/Platelet - COMPLETE METABOLIC PANEL WITH GFR - Magnesium - Lipid panel - TSH - Hemoglobin A1c - Insulin, random - VITAMIN D 25 Hydroxy            Patient was counseled in prudent diet, weight control to achieve/maintain BMI less than 25, BP monitoring, regular exercise and medications as discussed.  Discussed med effects and SE's. Routine screening labs and tests as requested with regular follow-up as recommended. Over 40 minutes of exam, counseling, chart review and high complex critical decision making was performed   Kirtland Bouchard, MD

## 2020-02-01 ENCOUNTER — Other Ambulatory Visit: Payer: Self-pay

## 2020-02-01 ENCOUNTER — Ambulatory Visit (INDEPENDENT_AMBULATORY_CARE_PROVIDER_SITE_OTHER): Payer: Medicare HMO | Admitting: Internal Medicine

## 2020-02-01 VITALS — BP 136/82 | HR 56 | Temp 97.1°F | Resp 16 | Ht 66.0 in | Wt 123.4 lb

## 2020-02-01 DIAGNOSIS — E782 Mixed hyperlipidemia: Secondary | ICD-10-CM | POA: Diagnosis not present

## 2020-02-01 DIAGNOSIS — Z136 Encounter for screening for cardiovascular disorders: Secondary | ICD-10-CM

## 2020-02-01 DIAGNOSIS — Z0001 Encounter for general adult medical examination with abnormal findings: Secondary | ICD-10-CM

## 2020-02-01 DIAGNOSIS — Z0000 Encounter for general adult medical examination without abnormal findings: Secondary | ICD-10-CM

## 2020-02-01 DIAGNOSIS — K219 Gastro-esophageal reflux disease without esophagitis: Secondary | ICD-10-CM | POA: Diagnosis not present

## 2020-02-01 DIAGNOSIS — Z8249 Family history of ischemic heart disease and other diseases of the circulatory system: Secondary | ICD-10-CM

## 2020-02-01 DIAGNOSIS — I10 Essential (primary) hypertension: Secondary | ICD-10-CM

## 2020-02-01 DIAGNOSIS — F0150 Vascular dementia without behavioral disturbance: Secondary | ICD-10-CM

## 2020-02-01 DIAGNOSIS — R7309 Other abnormal glucose: Secondary | ICD-10-CM

## 2020-02-01 DIAGNOSIS — Z79899 Other long term (current) drug therapy: Secondary | ICD-10-CM

## 2020-02-01 DIAGNOSIS — Z1211 Encounter for screening for malignant neoplasm of colon: Secondary | ICD-10-CM

## 2020-02-01 DIAGNOSIS — E559 Vitamin D deficiency, unspecified: Secondary | ICD-10-CM | POA: Diagnosis not present

## 2020-02-01 DIAGNOSIS — R7303 Prediabetes: Secondary | ICD-10-CM

## 2020-02-01 DIAGNOSIS — Z1212 Encounter for screening for malignant neoplasm of rectum: Secondary | ICD-10-CM

## 2020-02-02 LAB — CBC WITH DIFFERENTIAL/PLATELET
Absolute Monocytes: 248 cells/uL (ref 200–950)
Basophils Absolute: 21 cells/uL (ref 0–200)
Basophils Relative: 0.9 %
Eosinophils Absolute: 30 cells/uL (ref 15–500)
Eosinophils Relative: 1.3 %
HCT: 42.1 % (ref 35.0–45.0)
Hemoglobin: 14 g/dL (ref 11.7–15.5)
Lymphs Abs: 902 cells/uL (ref 850–3900)
MCH: 31.3 pg (ref 27.0–33.0)
MCHC: 33.3 g/dL (ref 32.0–36.0)
MCV: 94 fL (ref 80.0–100.0)
MPV: 9.9 fL (ref 7.5–12.5)
Monocytes Relative: 10.8 %
Neutro Abs: 1099 cells/uL — ABNORMAL LOW (ref 1500–7800)
Neutrophils Relative %: 47.8 %
Platelets: 171 10*3/uL (ref 140–400)
RBC: 4.48 10*6/uL (ref 3.80–5.10)
RDW: 12 % (ref 11.0–15.0)
Total Lymphocyte: 39.2 %
WBC: 2.3 10*3/uL — ABNORMAL LOW (ref 3.8–10.8)

## 2020-02-02 LAB — COMPLETE METABOLIC PANEL WITH GFR
AG Ratio: 1.7 (calc) (ref 1.0–2.5)
ALT: 23 U/L (ref 6–29)
AST: 25 U/L (ref 10–35)
Albumin: 4.2 g/dL (ref 3.6–5.1)
Alkaline phosphatase (APISO): 100 U/L (ref 37–153)
BUN/Creatinine Ratio: 16 (calc) (ref 6–22)
BUN: 16 mg/dL (ref 7–25)
CO2: 28 mmol/L (ref 20–32)
Calcium: 10.3 mg/dL (ref 8.6–10.4)
Chloride: 110 mmol/L (ref 98–110)
Creat: 0.99 mg/dL — ABNORMAL HIGH (ref 0.60–0.93)
GFR, Est African American: 66 mL/min/{1.73_m2} (ref >=60)
GFR, Est Non African American: 57 mL/min/{1.73_m2} — ABNORMAL LOW (ref >=60)
Globulin: 2.5 g/dL (calc) (ref 1.9–3.7)
Glucose, Bld: 96 mg/dL (ref 65–99)
Potassium: 4.1 mmol/L (ref 3.5–5.3)
Sodium: 143 mmol/L (ref 135–146)
Total Bilirubin: 1 mg/dL (ref 0.2–1.2)
Total Protein: 6.7 g/dL (ref 6.1–8.1)

## 2020-02-02 LAB — URINALYSIS, ROUTINE W REFLEX MICROSCOPIC
Bilirubin Urine: NEGATIVE
Glucose, UA: NEGATIVE
Hgb urine dipstick: NEGATIVE
Ketones, ur: NEGATIVE
Leukocytes,Ua: NEGATIVE
Nitrite: NEGATIVE
Protein, ur: NEGATIVE
Specific Gravity, Urine: 1.022 (ref 1.001–1.03)
pH: <=5 (ref 5.0–8.0)

## 2020-02-02 LAB — HEMOGLOBIN A1C
Hgb A1c MFr Bld: 5.5 % of total Hgb (ref <5.7)
Mean Plasma Glucose: 111 (calc)
eAG (mmol/L): 6.2 (calc)

## 2020-02-02 LAB — LIPID PANEL
Cholesterol: 196 mg/dL (ref <200)
HDL: 73 mg/dL (ref >=50)
LDL Cholesterol (Calc): 106 mg/dL (calc) — ABNORMAL HIGH
Non-HDL Cholesterol (Calc): 123 mg/dL (calc) (ref <130)
Total CHOL/HDL Ratio: 2.7 (calc) (ref <5.0)
Triglycerides: 84 mg/dL (ref <150)

## 2020-02-02 LAB — TSH: TSH: 1.18 mIU/L (ref 0.40–4.50)

## 2020-02-02 LAB — MICROALBUMIN / CREATININE URINE RATIO
Creatinine, Urine: 208 mg/dL (ref 20–275)
Microalb Creat Ratio: 3 mcg/mg creat (ref <30)
Microalb, Ur: 0.7 mg/dL

## 2020-02-02 LAB — VITAMIN D 25 HYDROXY (VIT D DEFICIENCY, FRACTURES): Vit D, 25-Hydroxy: 85 ng/mL (ref 30–100)

## 2020-02-02 LAB — INSULIN, RANDOM: Insulin: 6.7 u[IU]/mL

## 2020-02-02 LAB — MAGNESIUM: Magnesium: 2 mg/dL (ref 1.5–2.5)

## 2020-02-02 NOTE — Progress Notes (Signed)
==========================================================  -   Labs show CKD (Chronic Kidney Disease) Stage 3a   ( GFR  - Kidney flow rate = 57)   - Very important to Drink  more water / Fluid - At least 6 bottles (16 oz)  Is 96-100 oz /day of water  / Liquids / Fluids ==========================================================  - Total Chol = 196 - Sl elevated  (Ideal or Goal is less than 180)   - and   - Bad / Dangerous LDL Chol = 106- also too high  (Ideal or Goal is less than 70, So..........  - Recommend a stricter low cholesterol diet   - Cholesterol only comes from animal sources  - ie. meat, dairy, egg yolks  - Eat all the vegetables you want.  - Avoid meat, especially red meat - Beef AND Pork .  - Avoid cheese & dairy - milk & ice cream.     - Cheese is the most concentrated form of trans-fats which  is the worst thing to clog up our arteries.   - Veggie cheese is OK which can be found in the fresh  produce section at [employer] or [employer] or [employer] ==========================================================  - A1c = 5.5% - Back in Normal , Non Diabetic Range - Great  ==========================================================  - Vitamin = 102 - Great ==========================================================  - All Else - CBC - Electrolytes - Liver - Magnesium & Thyroid    - all  Normal / OK ====================================================

## 2020-02-21 ENCOUNTER — Other Ambulatory Visit: Payer: Self-pay | Admitting: Adult Health

## 2020-03-09 DIAGNOSIS — R69 Illness, unspecified: Secondary | ICD-10-CM | POA: Diagnosis not present

## 2020-04-11 ENCOUNTER — Other Ambulatory Visit: Payer: Self-pay | Admitting: Internal Medicine

## 2020-04-11 DIAGNOSIS — F0150 Vascular dementia without behavioral disturbance: Secondary | ICD-10-CM

## 2020-04-11 DIAGNOSIS — R413 Other amnesia: Secondary | ICD-10-CM

## 2020-05-11 ENCOUNTER — Other Ambulatory Visit: Payer: Self-pay

## 2020-05-11 ENCOUNTER — Encounter: Payer: Self-pay | Admitting: Adult Health Nurse Practitioner

## 2020-05-11 ENCOUNTER — Encounter (INDEPENDENT_AMBULATORY_CARE_PROVIDER_SITE_OTHER): Payer: Self-pay

## 2020-05-11 ENCOUNTER — Ambulatory Visit (INDEPENDENT_AMBULATORY_CARE_PROVIDER_SITE_OTHER): Payer: Medicare HMO | Admitting: Adult Health Nurse Practitioner

## 2020-05-11 VITALS — BP 122/80 | HR 66 | Temp 97.6°F | Ht 66.0 in | Wt 131.0 lb

## 2020-05-11 DIAGNOSIS — F0150 Vascular dementia without behavioral disturbance: Secondary | ICD-10-CM

## 2020-05-11 DIAGNOSIS — E441 Mild protein-calorie malnutrition: Secondary | ICD-10-CM

## 2020-05-11 DIAGNOSIS — I10 Essential (primary) hypertension: Secondary | ICD-10-CM

## 2020-05-11 DIAGNOSIS — H401111 Primary open-angle glaucoma, right eye, mild stage: Secondary | ICD-10-CM | POA: Diagnosis not present

## 2020-05-11 DIAGNOSIS — K219 Gastro-esophageal reflux disease without esophagitis: Secondary | ICD-10-CM | POA: Diagnosis not present

## 2020-05-11 DIAGNOSIS — R7309 Other abnormal glucose: Secondary | ICD-10-CM

## 2020-05-11 DIAGNOSIS — E559 Vitamin D deficiency, unspecified: Secondary | ICD-10-CM | POA: Diagnosis not present

## 2020-05-11 DIAGNOSIS — Z79899 Other long term (current) drug therapy: Secondary | ICD-10-CM | POA: Diagnosis not present

## 2020-05-11 DIAGNOSIS — H4010X0 Unspecified open-angle glaucoma, stage unspecified: Secondary | ICD-10-CM

## 2020-05-11 DIAGNOSIS — E782 Mixed hyperlipidemia: Secondary | ICD-10-CM

## 2020-05-11 DIAGNOSIS — Z961 Presence of intraocular lens: Secondary | ICD-10-CM | POA: Diagnosis not present

## 2020-05-11 DIAGNOSIS — R69 Illness, unspecified: Secondary | ICD-10-CM | POA: Diagnosis not present

## 2020-05-11 NOTE — Patient Instructions (Signed)
We will contact you with lab results in 1-3 days.  We can also mail a copy to you.  All of your health screenings are up to date.  Shingles vaccination, Shringrix, is one to look into.     Ask insurance and pharmacy about shingrix - it is a 2 part shot that we will not be getting in the office.   Suggest getting AFTER covid vaccines, have to wait at least a month This shot can make you feel bad due to such good immune response it can trigger some inflammation so take tylenol or aleve day of or day after and plan on resting.   Can go to [email or web address] for more information  Shingrix Vaccination  Two vaccines are licensed and recommended to prevent shingles in the [place] Zoster vaccine live (ZVL, Zostavax) has been in use since 2006. Recombinant zoster vaccine (RZV, Shingrix), has been in use since 2017 and is recommended by ACIP as the preferred shingles vaccine.  What Everyone Should Know about Shingles Vaccine (Shingrix) One of the Recommended Vaccines by Disease Shingles vaccination is the only way to protect against shingles and postherpetic neuralgia (PHN), the most common complication from shingles. CDC recommends that healthy adults 50 years and older get two doses of the shingles vaccine called Shingrix (recombinant zoster vaccine), separated by 2 to 6 months, to prevent shingles and the complications from the disease. Your doctor or pharmacist can give you Shingrix as a shot in your upper arm. Shingrix provides strong protection against shingles and PHN. Two doses of Shingrix is more than 90% effective at preventing shingles and PHN. Protection stays above 85% for at least the first four years after you get vaccinated. Shingrix is the preferred vaccine, over Zostavax (zoster vaccine live), a shingles vaccine in use since 2006. Zostavax may still be used to prevent shingles in healthy adults 60 years and older. For example, you could  use Zostavax if a person is allergic to Shingrix, prefers Zostavax, or requests immediate vaccination and Shingrix is unavailable. Who Should Get Shingrix? Healthy adults 50 years and older should get two doses of Shingrix, separated by 2 to 6 months. You should get Shingrix even if in the past you . had shingles  . received Zostavax  . are not sure if you had chickenpox There is no maximum age for getting Shingrix. If you had shingles in the past, you can get Shingrix to help prevent future occurrences of the disease. There is no specific length of time that you need to wait after having shingles before you can receive Shingrix, but generally you should make sure the shingles rash has gone away before getting vaccinated. You can get Shingrix whether or not you remember having had chickenpox in the past. Studies show that more than 99% of Americans 40 years and older have had chickenpox, even if they don't remember having the disease. Chickenpox and shingles are related because they are caused by the same virus (varicella zoster virus). After a person recovers from chickenpox, the virus stays dormant (inactive) in the body. It can reactivate years later and cause shingles. If you had Zostavax in the recent past, you should wait at least eight weeks before getting Shingrix. Talk to your healthcare provider to determine the best time to get Shingrix. Shingrix is available in [employer] and pharmacies. To find doctor's offices or pharmacies near you that offer the vaccine, visit [facility] Vaccine FinderExternal. If you have questions about Shingrix, talk with your healthcare provider. Vaccine  for Those 50 Years and Older  Shingrix reduces the risk of shingles and PHN by more than 90% in people 34 and older. CDC recommends the vaccine for healthy adults 60 and older.  Who Should Not Get Shingrix? You should not get Shingrix if you: . have ever had a severe allergic reaction to any component of the  vaccine or after a dose of Shingrix  . tested negative for immunity to varicella zoster virus. If you test negative, you should get chickenpox vaccine.  . currently have shingles  . currently are pregnant or breastfeeding. Women who are pregnant or breastfeeding should wait to get Shingrix.  Marland Kitchen receive specific antiviral drugs (acyclovir, famciclovir, or valacyclovir) 24 hours before vaccination (avoid use of these antiviral drugs for 14 days after vaccination)- zoster vaccine live only If you have a minor acute (starts suddenly) illness, such as a cold, you may get Shingrix. But if you have a moderate or severe acute illness, you should usually wait until you recover before getting the vaccine. This includes anyone with a temperature of 101.50F or higher. The side effects of the Shingrix are temporary, and usually last 2 to 3 days. While you may experience pain for a few days after getting Shingrix, the pain will be less severe than having shingles and the complications from the disease. How Well Does Shingrix Work? Two doses of Shingrix provides strong protection against shingles and postherpetic neuralgia (PHN), the most common complication of shingles. . In adults 38 to 72 years old who got two doses, Shingrix was 97% effective in preventing shingles; among adults 70 years and older, Shingrix was 91% effective.  . In adults 31 to 72 years old who got two doses, Shingrix was 91% effective in preventing PHN; among adults 70 years and older, Shingrix was 89% effective. Shingrix protection remained high (more than 85%) in people 70 years and older throughout the four years following vaccination. Since your risk of shingles and PHN increases as you get older, it is important to have strong protection against shingles in your older years. Top of Page  What Are the Possible Side Effects of Shingrix? Studies show that Shingrix is safe. The vaccine helps your body create a strong defense against shingles. As  a result, you are likely to have temporary side effects from getting the shots. The side effects may affect your ability to do normal daily activities for 2 to 3 days. Most people got a sore arm with mild or moderate pain after getting Shingrix, and some also had redness and swelling where they got the shot. Some people felt tired, had muscle pain, a headache, shivering, fever, stomach pain, or nausea. About 1 out of 6 people who got Shingrix experienced side effects that prevented them from doing regular activities. Symptoms went away on their own in about 2 to 3 days. Side effects were more common in younger people. You might have a reaction to the first or second dose of Shingrix, or both doses. If you experience side effects, you may choose to take over-the-counter pain medicine such as ibuprofen or acetaminophen. If you experience side effects from Shingrix, you should report them to the Vaccine Adverse Event Reporting System (VAERS). Your doctor might file this report, or you can do it yourself through the VAERS websiteExternal, or by calling [phone]. If you have any questions about side effects from Shingrix, talk with your doctor. The shingles vaccine does not contain thimerosal (a preservative containing mercury). Top of Page  When Should I See a Doctor Because of the Side Effects I Experience From Shingrix? In clinical trials, Shingrix was not associated with serious adverse events. In fact, serious side effects from vaccines are extremely rare. For example, for every 1 million doses of a vaccine given, only one or two people may have a severe allergic reaction. Signs of an allergic reaction happen within minutes or hours after vaccination and include hives, swelling of the face and throat, difficulty breathing, a fast heartbeat, dizziness, or weakness. If you experience these or any other life-threatening symptoms, see a doctor right away. Shingrix causes a strong response in your immune  system, so it may produce short-term side effects more intense than you are used to from other vaccines. These side effects can be uncomfortable, but they are expected and usually go away on their own in 2 or 3 days. Top of Page  How Can I Pay For Shingrix? There are several ways shingles vaccine may be paid for: Medicare . Medicare Part D plans cover the shingles vaccine, but there may be a cost to you depending on your plan. There may be a copay for the vaccine, or you may need to pay in full then get reimbursed for a certain amount.  . Medicare Part B does not cover the shingles vaccine. Medicaid . Medicaid may or may not cover the vaccine. Contact your insurer to find out. Private health insurance . Many private health insurance plans will cover the vaccine, but there may be a cost to you depending on your plan. Contact your insurer to find out. Vaccine assistance programs . Some pharmaceutical companies provide vaccines to eligible adults who cannot afford them. You may want to check with the vaccine manufacturer, [employer], about Shingrix. If you do not currently have health insurance, learn more about affordable health coverage optionsExternal. To find doctor's offices or pharmacies near you that offer the vaccine, visit HealthMap Vaccine FinderExternal.

## 2020-05-11 NOTE — Progress Notes (Signed)
FOLLOW UP 3 MONTH  Assessment:    Essential hypertension Continue current medications: Atenolol  Monitor blood pressure at home; call if consistently over 130/80 Continue DASH diet.   Reminder to go to the ER if any CP, SOB, nausea, dizziness, severe HA, changes vision/speech, left arm numbness and tingling and jaw pain. -     CBC with Differential/Platelet -     COMPLETE METABOLIC PANEL WITH GFR -     TSH  Hyperlipademia, mixed Continue medications: rosuvastatin 20mg  daily Discussed dietary and exercise modifications Low fat diet   Vascular dementia without behavioral disturbance (HCC) Taking namenda and aircept Follows with Dr. [name]  Gastroesophageal reflux disease, esophagitis presence not specified Continue PPI/H2 blocker, diet discussed  Open-angle glaucoma of right eye, unspecified glaucoma stage, unspecified open-angle glaucoma type S/p surgery doing well  Mild malnutrition (HCC) BMI 21 Improved, weight increased Monitor closely If continuing weight loss with breast cancer history and mild epigastric pain, we will get a CT AB/Chest- declines at this time  Vitamin D deficiency Continue supplement  Medication management Continued   Over 30 minutes of face to face interview ], chart review, face to face exam, counseling, chart review and critical decision making was performed. Future Appointments  Date Time Provider [place]  08/14/2020  2:30 PM Unk Pinto, MD [facility] None  10/24/2020 10:00 AM Garnet Sierras, NP [facility] None  02/05/2021 11:00 AM Unk Pinto, MD [facility] None      Subjective:  Shannon Nielsen is a 74 y.o. female who presents for 3 month follow up.  She is accompanied by her husband, [name] today.  She has history of dementia and follows with Dr [name] for this.  She is taking aricept and namenda.  She is alert and oriented x2.  She is pleasant with conversation.  Denies any health concerns.  Husband denies any health  or medication concerns today.  She has history of breast cancer (2011) s/p bilateral mastectomy and radiation complete Nov 2011.  She had neuropathy during treatments but has since resolved.  Had glaucoma and cataract surgery at [facility] 09/29/2018 with Dr. Arlis Porta. She is doing well and has routine eye exams.    BMI is Body mass index is 21.14 kg/m.  a year ago she was 127, she is 119 today. Husband states her appetite has improved and she is eating well.  She has gained some weight since last OV. Wt Readings from Last 3 Encounters:  05/11/20 131 lb (59.4 kg)  02/01/20 123 lb 6.4 oz (56 kg)  10/19/19 119 lb 9.6 oz (54.3 kg)   Her blood pressure has been controlled at home, today their BP is BP: 122/80  She does not workout but her husband reports he knows they need to start walking together.  She reports her neighbor wants to walk with her and she will start this.  She denies chest pain, shortness of breath, dizziness.  She is on cholesterol medication and denies myalgias. Her cholesterol is at goal. The cholesterol last visit was:   Lab Results  Component Value Date   CHOL 160 05/11/2020   HDL 76 05/11/2020   LDLCALC 71 05/11/2020   TRIG 54 05/11/2020   CHOLHDL 2.1 05/11/2020   She has a history of preDM but has decreased sugar and has increased water. Last A1C in the office was:  Lab Results  Component Value Date   HGBA1C 5.5 02/01/2020   She has history of CKD, last OV in normal range. Last GFR: Lab  Results  Component Value Date   GFRAA 39 (L) 05/11/2020   Patient is on Vitamin D supplement.   Lab Results  Component Value Date   VD25OH 85 02/01/2020     BMI is Body mass index is 21.14 kg/m., Wt Readings from Last 10 Encounters:  05/11/20 131 lb (59.4 kg)  02/01/20 123 lb 6.4 oz (56 kg)  10/19/19 119 lb 9.6 oz (54.3 kg)  07/07/19 108 lb 12.8 oz (49.4 kg)  04/06/19 109 lb 3.2 oz (49.5 kg)  02/23/19 111 lb 3.2 oz (50.4 kg)  12/31/18 106 lb 12.8 oz (48.4 kg)  10/01/18  116 lb (52.6 kg)  08/13/18 112 lb (50.8 kg)  08/10/18 116 lb (52.6 kg)    Medication Review: Current Outpatient Medications on File Prior to Visit  Medication Sig Dispense Refill  . donepezil (ARICEPT) 10 MG tablet TAKE 1 TABLET BY MOUTH EVERYDAY AT BEDTIME 90 tablet 2  . memantine (NAMENDA) 10 MG tablet Take     1 tablet     2 x /day        for Memory 180 tablet 0  . aspirin 81 MG tablet Take 81 mg by mouth daily.     . Cholecalciferol (VITAMIN D3) 1.25 MG (50000 UT) CAPS TAKE ONE CAPSULE THREE DAYS A WEEK FOR TWELVE WEEKS. 36 capsule 0  . Dorzolamide HCl-Timolol Mal PF 22.3-6.8 MG/ML SOLN Apply 1 drop to eye 2 (two) times daily. Left eye    . fluticasone (FLONASE) 50 MCG/ACT nasal spray INSTILL 1 SPRAY INTO EACH NOSTRIL TWICE A DAY 48 mL 3  . latanoprost (XALATAN) 0.005 % ophthalmic solution Place 1 drop into both eyes at bedtime.    Marland Kitchen letrozole (FEMARA) 2.5 MG tablet letrozole 2.5 mg tablet    . mirtazapine (REMERON) 15 MG tablet Take 1 tablet at Bedtime for Appetite 90 tablet 3  . OVER THE COUNTER MEDICATION daily. B-Complex with 1000 mcg Biotin    . rosuvastatin (CRESTOR) 20 MG tablet Take 1 tablet    Daily    For Cholesterol 90 tablet 0   No current facility-administered medications on file prior to visit.    Allergies  Allergen Reactions  . Minocycline   . Prednisone Swelling  . Sulfa Antibiotics     Patient does not remember the type of reaction, happened years ago.  . Zithromax [Azithromycin] Swelling  . Ciprofloxacin Hcl Rash  . Penicillins Rash    Current Problems (verified) Patient Active Problem List   Diagnosis Date Noted  . Mild malnutrition ([place]) 10/01/2018  . Vascular dementia without behavioral disturbance ([place]) 06/22/2018  . B12 deficiency 06/22/2018  . Abnormal glucose 06/22/2018  . Memory changes 10/27/2017  . Iron deficiency 11/30/2016  . Body mass index (BMI) of 19.0-19.9 in adult 05/08/2015  . Open-angle glaucoma 05/08/2015  . Medication management  08/30/2013  . Vitamin D deficiency   . GERD (gastroesophageal reflux disease)   . Hyperparathyroidism, primary ([place]) 08/12/2012  . Breast cancer of upper-outer quadrant of left female breast ([place]) 07/08/2011  . Hyperlipidemia, mixed 10/18/2010  . Essential hypertension 10/18/2010    Screening Tests Immunization History  Administered Date(s) Administered  . Influenza, High Dose Seasonal PF 03/07/2014, 05/08/2015, 03/11/2017, 03/03/2018, 04/06/2019  . PFIZER SARS-COV-2 Vaccination 07/02/2019  . PPD Test 08/30/2013, 10/04/2014, 10/24/2015  . Pneumococcal Conjugate-13 03/24/2014  . Pneumococcal Polysaccharide-23 09/08/2006, 03/11/2017  . Tdap 08/27/2012  . Zoster 09/03/2012   Preventative care: Last colonoscopy: 2014 Last mammogram: 2011 s/p double mastectomy Last pap smear/pelvic  exam: 2015 declines another DEXA:04/2016 osteopenia Stress test 2012  Prior vaccinations: TD or Tdap: 2014  Influenza: 04/2020 Pneumococcal: 2018 Prevnar13: 2015 Shingles/Zostavax: 2014  Names of Other Physician/Practitioners you currently use: 1. [facility] here for primary care 2. Delman Cheadle, eye doctor, last visit 04/2020  3. Dr. Lilian Coma dentist, last visit Q6 months 12/21  Patient Care Team: Unk Pinto, MD as PCP - General (Internal Medicine) Clarene Essex, MD as Consulting Physician (Gastroenterology) Meda Klinefelter, MD as Consulting Physician (Endocrinology) Mordecai Rasmussen, MD as Consulting Physician (Surgery)  SURGICAL HISTORY She  has a past surgical history that includes Mastectomy (August 2011); Parathyroidectomy; and Nasal sinus surgery. FAMILY HISTORY Her family history includes Alzheimer's disease in her mother; Cancer in her mother; Dementia in her maternal grandfather and maternal grandmother; Diabetes in her mother; Heart attack in her brother; Heart attack (age of onset: 24) in her father. SOCIAL HISTORY She  reports that she  has never smoked. She has never used smokeless tobacco. She reports that she does not drink alcohol and does not use drugs.    Review of Systems  Constitutional: Negative for chills, diaphoresis, fever, malaise/fatigue and weight loss.  HENT: Negative for congestion, ear discharge, ear pain, hearing loss, nosebleeds, sinus pain, sore throat and tinnitus.   Eyes: Negative for blurred vision, double vision, photophobia, pain, discharge and redness.  Respiratory: Negative for cough, hemoptysis, sputum production, shortness of breath, wheezing and stridor.   Cardiovascular: Negative for chest pain, palpitations, orthopnea, claudication, leg swelling and PND.  Gastrointestinal: Negative for abdominal pain, blood in stool, constipation, diarrhea, heartburn, melena, nausea and vomiting.  Genitourinary: Negative for dysuria, flank pain, frequency, hematuria and urgency.  Musculoskeletal: Negative for back pain, falls, joint pain, myalgias and neck pain.  Skin: Negative for itching and rash.  Neurological: Negative for dizziness, tingling, tremors, sensory change, speech change, focal weakness, seizures, loss of consciousness, weakness and headaches.  Endo/Heme/Allergies: Negative for environmental allergies and polydipsia. Does not bruise/bleed easily.  Psychiatric/Behavioral: Negative for depression, hallucinations, memory loss, substance abuse and suicidal ideas. The patient is not nervous/anxious and does not have insomnia.      Objective:     Today's Vitals   05/11/20 1049  BP: 122/80  Pulse: 66  Temp: 97.6 F (36.4 C)  SpO2: 100%  Weight: 131 lb (59.4 kg)  Height: 5\' 6"  (1.676 m)   Body mass index is 21.14 kg/m.  General appearance: alert, no distress, WD/WN, female HEENT: normocephalic, sclerae anicteric, TMs pearly, nares patent, no discharge or erythema, pharynx normal Oral cavity: MMM, no lesions Neck: supple, no lymphadenopathy, no thyromegaly, no masses Heart: RRR, normal  S1, S2, no murmurs Lungs: CTA bilaterally, no wheezes, rhonchi, or rales Abdomen: +bs, soft, non tender, non distended, no masses, no hepatomegaly, no splenomegaly Musculoskeletal: nontender, no swelling, no obvious deformity Extremities: no edema, no cyanosis, no clubbing Pulses: 2+ symmetric, upper and lower extremities, normal cap refill Neurological: alert, oriented x 3, CN2-12 intact, strength normal upper extremities and lower extremities, sensation normal throughout, DTRs 2+ throughout, no cerebellar signs, gait normal Psychiatric: normal affect, behavior normal, pleasant     Garnet Sierras, Laqueta Jean, DNP [facility] 05/11/2020  3:40 PM

## 2020-05-12 LAB — CBC WITH DIFFERENTIAL/PLATELET
Absolute Monocytes: 386 cells/uL (ref 200–950)
Basophils Absolute: 21 cells/uL (ref 0–200)
Basophils Relative: 0.5 %
Eosinophils Absolute: 8 cells/uL — ABNORMAL LOW (ref 15–500)
Eosinophils Relative: 0.2 %
HCT: 44.3 % (ref 35.0–45.0)
Hemoglobin: 14.7 g/dL (ref 11.7–15.5)
Lymphs Abs: 1130 cells/uL (ref 850–3900)
MCH: 31 pg (ref 27.0–33.0)
MCHC: 33.2 g/dL (ref 32.0–36.0)
MCV: 93.5 fL (ref 80.0–100.0)
MPV: 9.9 fL (ref 7.5–12.5)
Monocytes Relative: 9.2 %
Neutro Abs: 2654 cells/uL (ref 1500–7800)
Neutrophils Relative %: 63.2 %
Platelets: 166 10*3/uL (ref 140–400)
RBC: 4.74 10*6/uL (ref 3.80–5.10)
RDW: 11.9 % (ref 11.0–15.0)
Total Lymphocyte: 26.9 %
WBC: 4.2 10*3/uL (ref 3.8–10.8)

## 2020-05-12 LAB — COMPLETE METABOLIC PANEL WITH GFR
AG Ratio: 1.5 (calc) (ref 1.0–2.5)
ALT: 19 U/L (ref 6–29)
AST: 24 U/L (ref 10–35)
Albumin: 4.1 g/dL (ref 3.6–5.1)
Alkaline phosphatase (APISO): 99 U/L (ref 37–153)
BUN/Creatinine Ratio: 13 (calc) (ref 6–22)
BUN: 15 mg/dL (ref 7–25)
CO2: 24 mmol/L (ref 20–32)
Calcium: 10.1 mg/dL (ref 8.6–10.4)
Chloride: 110 mmol/L (ref 98–110)
Creat: 1.12 mg/dL — ABNORMAL HIGH (ref 0.60–0.93)
GFR, Est African American: 57 mL/min/{1.73_m2} — ABNORMAL LOW (ref >=60)
GFR, Est Non African American: 49 mL/min/{1.73_m2} — ABNORMAL LOW (ref >=60)
Globulin: 2.7 g/dL (calc) (ref 1.9–3.7)
Glucose, Bld: 67 mg/dL (ref 65–99)
Potassium: 4.3 mmol/L (ref 3.5–5.3)
Sodium: 147 mmol/L — ABNORMAL HIGH (ref 135–146)
Total Bilirubin: 0.8 mg/dL (ref 0.2–1.2)
Total Protein: 6.8 g/dL (ref 6.1–8.1)

## 2020-05-12 LAB — LIPID PANEL
Cholesterol: 160 mg/dL (ref <200)
HDL: 76 mg/dL (ref >=50)
LDL Cholesterol (Calc): 71 mg/dL (calc)
Non-HDL Cholesterol (Calc): 84 mg/dL (calc) (ref <130)
Total CHOL/HDL Ratio: 2.1 (calc) (ref <5.0)
Triglycerides: 54 mg/dL (ref <150)

## 2020-05-20 ENCOUNTER — Other Ambulatory Visit: Payer: Self-pay | Admitting: Adult Health

## 2020-05-20 DIAGNOSIS — I10 Essential (primary) hypertension: Secondary | ICD-10-CM

## 2020-08-14 ENCOUNTER — Ambulatory Visit: Payer: Medicare HMO | Admitting: Internal Medicine

## 2020-08-21 ENCOUNTER — Encounter: Payer: Self-pay | Admitting: Internal Medicine

## 2020-08-21 NOTE — Patient Instructions (Signed)

## 2020-08-21 NOTE — Progress Notes (Signed)
/       History of Present Illness:       This very nice 75 y.o. MBF presents with husband for 6 month follow up with HTN, HLD, GERD Pre-Diabetes and Vitamin D Deficiency.  Patient also has moderateVascular Dementia & is followed by Dr [name]. Patient apparently requires moderate supervision and direction in ADL's.        Patient is treated for HTN (1990)  BP has been controlled at home. Today's BP is at goal - 140/80. Patient has had no complaints of any cardiac type chest pain, palpitations, dyspnea / orthopnea / PND, dizziness, claudication, or dependent edema.       Hyperlipidemia is controlled with diet & meds. Patient denies myalgias or other med SE's. Last Lipids were at goal:  Lab Results  Component Value Date   CHOL 160 05/11/2020   HDL 76 05/11/2020   LDLCALC 71 05/11/2020   TRIG 54 05/11/2020   CHOLHDL 2.1 05/11/2020     Also, the patient has history of PreDiabetes (A1c 6.0%/2011) and has had no symptoms of reactive hypoglycemia, diabetic polys, paresthesias or visual blurring.  Last A1c was normal & at goal:  Lab Results  Component Value Date   HGBA1C 5.5 02/01/2020            Further, the patient also has history of Vitamin D Deficiency ("13"/2008) and supplements vitamin D without any suspected side-effects. Last vitamin D was at goal:  Lab Results  Component Value Date   VD25OH 85 02/01/2020    Current Outpatient Medications on File Prior to Visit  Medication Sig  . aspirin 81 MG tablet Take 81 mg by mouth daily.   Marland Kitchen atenolol  100 MG tablet Take      1 tablet      Daily       for  BP  . VITAMIN D 50,0000 u  TAKE ONE CAPSULE THREE DAYS/WEEK   . donepezil (ARICEPT) 10 MG tablet TAKE 1 TABLET   AT BEDTIME  . FLONASE nasal spray 1 SPRAY INTO NOSTRILS 2 x/day  . XALATAN)\ 0.005 % ophth  soln Place 1 drop into both eyes at bedtime.  Marland Kitchen letrozole (FEMARA) 2.5 MG tablet letrozole 2.5 mg tablet  . memantine (NAMENDA) 10 MG tablet Take     1 tablet     2 x /day         for Memory  . mirtazapine (REMERON) 15 MG tablet Take 1 tablet at Bedtime for Appetite  . B-Complex with 1000 mcg Biotin daily.   . rosuvastatin  20 MG tablet Take 1 tablet    Daily    For Cholesterol    Allergies  Allergen Reactions  . Minocycline   . Prednisone Swelling  . Sulfa Antibiotics     Patient does not remember the type of reaction, happened years ago.  . Zithromax [Azithromycin] Swelling  . Ciprofloxacin Hcl Rash  . Penicillins Rash    PMHx:   Past Medical History:  Diagnosis Date  . Breast cancer ([place])   . Chest tightness   . GERD (gastroesophageal reflux disease)   . Hepatitis C virus screening Negative 08/31/2012 03/08/2018  . Hyperlipidemia   . Hypertension   . Palpitations   . Prediabetes   . Vitamin D deficiency     Immunization History  Administered Date(s) Administered  . Influenza, High Dose Seasonal PF 03/07/2014, 05/08/2015, 03/11/2017, 03/03/2018, 04/06/2019  . PFIZER([place])SARS-COV-2 Vaccination 07/02/2019  . PPD Test 08/30/2013, 10/04/2014, 10/24/2015  .  Pneumococcal Conjugate-13 03/24/2014  . Pneumococcal Polysaccharide-23 09/08/2006, 03/11/2017  . Tdap 08/27/2012  . Zoster 09/03/2012    Past Surgical History:  Procedure Laterality Date  . MASTECTOMY  Bilateral  August 2011  . NASAL SINUS SURGERY    . PARATHYROIDECTOMY      FHx:    Reviewed / unchanged  SHx:    Reviewed / unchanged   Systems Review:  Constitutional: Denies fever, chills, wt changes, headaches, insomnia, fatigue, night sweats, change in appetite. Eyes: Denies redness, blurred vision, diplopia, discharge, itchy, watery eyes.  ENT: Denies discharge, congestion, post nasal drip, epistaxis, sore throat, earache, hearing loss, dental pain, tinnitus, vertigo, sinus pain, snoring.  CV: Denies chest pain, palpitations, irregular heartbeat, syncope, dyspnea, diaphoresis, orthopnea, PND, claudication or edema. Respiratory: denies cough, dyspnea, DOE, pleurisy,  hoarseness, laryngitis, wheezing.  Gastrointestinal: Denies dysphagia, odynophagia, heartburn, reflux, water brash, abdominal pain or cramps, nausea, vomiting, bloating, diarrhea, constipation, hematemesis, melena, hematochezia  or hemorrhoids. Genitourinary: Denies dysuria, frequency, urgency, nocturia, hesitancy, discharge, hematuria or flank pain. Musculoskeletal: Denies arthralgias, myalgias, stiffness, jt. swelling, pain, limping or strain/sprain.  Skin: Denies pruritus, rash, hives, warts, acne, eczema or change in skin lesion(s). Neuro: No weakness, tremor, incoordination, spasms, paresthesia or pain. Psychiatric: Denies confusion, memory loss or sensory loss. Endo: Denies change in weight, skin or hair change.  Heme/Lymph: No excessive bleeding, bruising or enlarged lymph nodes.  Physical Exam  BP 140/80   Pulse (!) 55   Temp 97.7 F (36.5 C)   Resp 16   Ht 5\' 6"  (1.676 m)   Wt 128 lb 9.6 oz (58.3 kg)   SpO2 92%   BMI 20.76 kg/m   Appears  well nourished, well groomed  and in no distress.  Eyes: PERRLA, EOMs, conjunctiva no swelling or erythema. Sinuses: No frontal/maxillary tenderness ENT/Mouth: EAC's clear, TM's nl w/o erythema, bulging. Nares clear w/o erythema, swelling, exudates. Oropharynx clear without erythema or exudates. Oral hygiene is good. Tongue normal, non obstructing. Hearing intact.  Neck: Supple. Thyroid not palpable. Car 2+/2+ without bruits, nodes or JVD. Chest: Respirations nl with BS clear & equal w/o rales, rhonchi, wheezing or stridor.  Cor: Heart sounds normal w/ regular rate and rhythm without sig. murmurs, gallops, clicks or rubs. Peripheral pulses normal and equal  without edema.  Abdomen: Soft & bowel sounds normal. Non-tender w/o guarding, rebound, hernias, masses or organomegaly.  Lymphatics: Unremarkable.  Musculoskeletal: Full ROM all peripheral extremities, joint stability, 5/5 strength and normal gait.  Skin: Warm, dry without exposed  rashes, lesions or ecchymosis apparent.  Neuro: Cranial nerves intact, reflexes equal bilaterally. Sensory-motor testing grossly intact. Tendon reflexes grossly intact.  Pysch:  Pleasant, alert & oriented x 3.  Insight and judgement limited to concrete ideations.   Assessment and Plan:  1. Essential hypertension  - Continue medication, monitor blood pressure at home.  - Continue DASH diet.  Reminder to go to the ER if any CP,  SOB, nausea, dizziness, severe HA, changes vision/speech.  - CBC with Differential/Platelet - COMPLETE METABOLIC PANEL WITH GFR - Magnesium - TSH  2. Hyperlipidemia, mixed  - Continue diet/meds, exercise,& lifestyle modifications.  - Continue monitor periodic cholesterol/liver & renal functions   - Lipid panel - TSH  3. Abnormal glucose  - Continue diet, exercise  - Lifestyle modifications.  - Monitor appropriate labs  - Hemoglobin A1c - Insulin, random  4. Vitamin D deficiency  - Continue supplementation.  - VITAMIN D 25 Hydroxyl  5. Vascular dementia without behavioral disturbance (  [place])  - Lipid panel - TSH  6. Medication management  - CBC with Differential/Platelet - COMPLETE METABOLIC PANEL WITH GFR - Magnesium - Lipid panel - TSH - Hemoglobin A1c - Insulin, random - VITAMIN D 25 Hydroxyl          Discussed  regular exercise, BP monitoring, weight control to achieve/maintain BMI less than 25 and discussed med and SE's. Recommended labs to assess and monitor clinical status with further disposition pending results of labs.  I discussed the assessment and treatment plan with the patient. The patient was provided an opportunity to ask questions and all were answered. The patient agreed with the plan and demonstrated an understanding of the instructions.  I provided over 30 minutes of exam, counseling, chart review and  complex critical decision making.         The patient was advised to call back or seek an in-person evaluation if the  symptoms worsen or if the condition fails to improve as anticipated.   Kirtland Bouchard, MD

## 2020-08-22 ENCOUNTER — Other Ambulatory Visit: Payer: Self-pay

## 2020-08-22 ENCOUNTER — Ambulatory Visit (INDEPENDENT_AMBULATORY_CARE_PROVIDER_SITE_OTHER): Payer: Medicare HMO | Admitting: Internal Medicine

## 2020-08-22 VITALS — BP 140/80 | HR 55 | Temp 97.7°F | Resp 16 | Ht 66.0 in | Wt 128.6 lb

## 2020-08-22 DIAGNOSIS — Z79899 Other long term (current) drug therapy: Secondary | ICD-10-CM | POA: Diagnosis not present

## 2020-08-22 DIAGNOSIS — R69 Illness, unspecified: Secondary | ICD-10-CM | POA: Diagnosis not present

## 2020-08-22 DIAGNOSIS — E559 Vitamin D deficiency, unspecified: Secondary | ICD-10-CM

## 2020-08-22 DIAGNOSIS — R7309 Other abnormal glucose: Secondary | ICD-10-CM | POA: Diagnosis not present

## 2020-08-22 DIAGNOSIS — I10 Essential (primary) hypertension: Secondary | ICD-10-CM

## 2020-08-22 DIAGNOSIS — E782 Mixed hyperlipidemia: Secondary | ICD-10-CM

## 2020-08-22 DIAGNOSIS — F0150 Vascular dementia without behavioral disturbance: Secondary | ICD-10-CM

## 2020-08-23 LAB — COMPLETE METABOLIC PANEL WITH GFR
AG Ratio: 1.5 (calc) (ref 1.0–2.5)
ALT: 12 U/L (ref 6–29)
AST: 19 U/L (ref 10–35)
Albumin: 4.2 g/dL (ref 3.6–5.1)
Alkaline phosphatase (APISO): 112 U/L (ref 37–153)
BUN/Creatinine Ratio: 12 (calc) (ref 6–22)
BUN: 14 mg/dL (ref 7–25)
CO2: 27 mmol/L (ref 20–32)
Calcium: 10.4 mg/dL (ref 8.6–10.4)
Chloride: 109 mmol/L (ref 98–110)
Creat: 1.15 mg/dL — ABNORMAL HIGH (ref 0.60–0.93)
GFR, Est African American: 55 mL/min/{1.73_m2} — ABNORMAL LOW (ref >=60)
GFR, Est Non African American: 47 mL/min/{1.73_m2} — ABNORMAL LOW (ref >=60)
Globulin: 2.8 g/dL (calc) (ref 1.9–3.7)
Glucose, Bld: 94 mg/dL (ref 65–99)
Potassium: 4.4 mmol/L (ref 3.5–5.3)
Sodium: 142 mmol/L (ref 135–146)
Total Bilirubin: 1.2 mg/dL (ref 0.2–1.2)
Total Protein: 7 g/dL (ref 6.1–8.1)

## 2020-08-23 LAB — CBC WITH DIFFERENTIAL/PLATELET
Absolute Monocytes: 301 cells/uL (ref 200–950)
Basophils Absolute: 9 cells/uL (ref 0–200)
Basophils Relative: 0.3 %
Eosinophils Absolute: 19 cells/uL (ref 15–500)
Eosinophils Relative: 0.6 %
HCT: 46 % — ABNORMAL HIGH (ref 35.0–45.0)
Hemoglobin: 15.5 g/dL (ref 11.7–15.5)
Lymphs Abs: 1150 cells/uL (ref 850–3900)
MCH: 31.8 pg (ref 27.0–33.0)
MCHC: 33.7 g/dL (ref 32.0–36.0)
MCV: 94.3 fL (ref 80.0–100.0)
MPV: 10.3 fL (ref 7.5–12.5)
Monocytes Relative: 9.7 %
Neutro Abs: 1621 cells/uL (ref 1500–7800)
Neutrophils Relative %: 52.3 %
Platelets: 194 10*3/uL (ref 140–400)
RBC: 4.88 10*6/uL (ref 3.80–5.10)
RDW: 11.8 % (ref 11.0–15.0)
Total Lymphocyte: 37.1 %
WBC: 3.1 10*3/uL — ABNORMAL LOW (ref 3.8–10.8)

## 2020-08-23 LAB — MAGNESIUM: Magnesium: 2 mg/dL (ref 1.5–2.5)

## 2020-08-23 LAB — HEMOGLOBIN A1C
Hgb A1c MFr Bld: 5.5 % of total Hgb (ref <5.7)
Mean Plasma Glucose: 111 mg/dL
eAG (mmol/L): 6.2 mmol/L

## 2020-08-23 LAB — LIPID PANEL
Cholesterol: 178 mg/dL (ref <200)
HDL: 74 mg/dL (ref >=50)
LDL Cholesterol (Calc): 89 mg/dL (calc)
Non-HDL Cholesterol (Calc): 104 mg/dL (calc) (ref <130)
Total CHOL/HDL Ratio: 2.4 (calc) (ref <5.0)
Triglycerides: 61 mg/dL (ref <150)

## 2020-08-23 LAB — VITAMIN D 25 HYDROXY (VIT D DEFICIENCY, FRACTURES): Vit D, 25-Hydroxy: 48 ng/mL (ref 30–100)

## 2020-08-23 LAB — TSH: TSH: 0.84 mIU/L (ref 0.40–4.50)

## 2020-08-23 LAB — INSULIN, RANDOM: Insulin: 8.5 u[IU]/mL

## 2020-08-23 NOTE — Progress Notes (Signed)
============================================================ -   Test results slightly outside the reference range are not unusual. If there is anything important, I will review this with you,  otherwise it is considered normal test values.  If you have further questions,  please do not hesitate to contact me at the office or via My Chart.  ============================================================ ============================================================  -  Kidney functions still Stage 3a and Stable  ============================================================ ============================================================  -  Total chol = 178  and LDL Chol = 89   -  Both  Excellent   - Very low risk for Heart Attack  / Stroke ============================================================ ============================================================  -  A1c - Normal - Great - No Diabetes  !  ============================================================ ============================================================  -  Vitamin D - has dropped from 85 down to 48  ? -   - Suggest that you stop the Vitamin D 50,000 unit capsules 3 x /week    &   Purchase Vitamin D 5,000 unit capsules &                                   take 2 capsules = 10,000 units every day - 7 days  /week   ============================================================ ============================================================  -  All Else - CBC -  Electrolytes - Liver - Magnesium & Thyroid    - all  Normal / OK ============================================================ ============================================================

## 2020-09-07 DIAGNOSIS — H401111 Primary open-angle glaucoma, right eye, mild stage: Secondary | ICD-10-CM | POA: Diagnosis not present

## 2020-09-14 ENCOUNTER — Other Ambulatory Visit: Payer: Self-pay | Admitting: Internal Medicine

## 2020-09-14 DIAGNOSIS — R413 Other amnesia: Secondary | ICD-10-CM

## 2020-09-14 DIAGNOSIS — F0150 Vascular dementia without behavioral disturbance: Secondary | ICD-10-CM

## 2020-10-07 ENCOUNTER — Other Ambulatory Visit: Payer: Self-pay | Admitting: Internal Medicine

## 2020-10-07 DIAGNOSIS — I10 Essential (primary) hypertension: Secondary | ICD-10-CM

## 2020-10-20 DIAGNOSIS — R630 Anorexia: Secondary | ICD-10-CM | POA: Diagnosis not present

## 2020-10-20 DIAGNOSIS — I10 Essential (primary) hypertension: Secondary | ICD-10-CM | POA: Diagnosis not present

## 2020-10-20 DIAGNOSIS — Z79899 Other long term (current) drug therapy: Secondary | ICD-10-CM | POA: Diagnosis not present

## 2020-10-20 DIAGNOSIS — E785 Hyperlipidemia, unspecified: Secondary | ICD-10-CM | POA: Diagnosis not present

## 2020-10-20 DIAGNOSIS — Z7982 Long term (current) use of aspirin: Secondary | ICD-10-CM | POA: Diagnosis not present

## 2020-10-20 DIAGNOSIS — H409 Unspecified glaucoma: Secondary | ICD-10-CM | POA: Diagnosis not present

## 2020-10-20 DIAGNOSIS — R32 Unspecified urinary incontinence: Secondary | ICD-10-CM | POA: Diagnosis not present

## 2020-10-20 DIAGNOSIS — R69 Illness, unspecified: Secondary | ICD-10-CM | POA: Diagnosis not present

## 2020-10-20 DIAGNOSIS — G629 Polyneuropathy, unspecified: Secondary | ICD-10-CM | POA: Diagnosis not present

## 2020-10-20 DIAGNOSIS — J309 Allergic rhinitis, unspecified: Secondary | ICD-10-CM | POA: Diagnosis not present

## 2020-10-24 ENCOUNTER — Ambulatory Visit: Payer: Medicare HMO | Admitting: Adult Health Nurse Practitioner

## 2020-10-25 ENCOUNTER — Ambulatory Visit: Payer: Medicare HMO | Admitting: Adult Health

## 2020-11-12 ENCOUNTER — Other Ambulatory Visit: Payer: Self-pay | Admitting: Internal Medicine

## 2020-11-12 DIAGNOSIS — F0280 Dementia in other diseases classified elsewhere without behavioral disturbance: Secondary | ICD-10-CM

## 2020-11-12 DIAGNOSIS — R634 Abnormal weight loss: Secondary | ICD-10-CM

## 2020-11-30 ENCOUNTER — Ambulatory Visit (INDEPENDENT_AMBULATORY_CARE_PROVIDER_SITE_OTHER): Payer: Medicare HMO | Admitting: Adult Health

## 2020-11-30 ENCOUNTER — Encounter: Payer: Self-pay | Admitting: Adult Health

## 2020-11-30 ENCOUNTER — Other Ambulatory Visit: Payer: Self-pay

## 2020-11-30 VITALS — BP 138/76 | HR 51 | Temp 97.3°F | Wt 132.0 lb

## 2020-11-30 DIAGNOSIS — Z6821 Body mass index (BMI) 21.0-21.9, adult: Secondary | ICD-10-CM | POA: Diagnosis not present

## 2020-11-30 DIAGNOSIS — R413 Other amnesia: Secondary | ICD-10-CM

## 2020-11-30 DIAGNOSIS — E441 Mild protein-calorie malnutrition: Secondary | ICD-10-CM | POA: Diagnosis not present

## 2020-11-30 DIAGNOSIS — F0150 Vascular dementia without behavioral disturbance: Secondary | ICD-10-CM

## 2020-11-30 DIAGNOSIS — K219 Gastro-esophageal reflux disease without esophagitis: Secondary | ICD-10-CM | POA: Diagnosis not present

## 2020-11-30 DIAGNOSIS — R69 Illness, unspecified: Secondary | ICD-10-CM | POA: Diagnosis not present

## 2020-11-30 DIAGNOSIS — E559 Vitamin D deficiency, unspecified: Secondary | ICD-10-CM

## 2020-11-30 DIAGNOSIS — N1831 Chronic kidney disease, stage 3a: Secondary | ICD-10-CM | POA: Insufficient documentation

## 2020-11-30 DIAGNOSIS — E538 Deficiency of other specified B group vitamins: Secondary | ICD-10-CM

## 2020-11-30 DIAGNOSIS — R6889 Other general symptoms and signs: Secondary | ICD-10-CM

## 2020-11-30 DIAGNOSIS — Z853 Personal history of malignant neoplasm of breast: Secondary | ICD-10-CM | POA: Insufficient documentation

## 2020-11-30 DIAGNOSIS — Z0001 Encounter for general adult medical examination with abnormal findings: Secondary | ICD-10-CM

## 2020-11-30 DIAGNOSIS — E210 Primary hyperparathyroidism: Secondary | ICD-10-CM

## 2020-11-30 DIAGNOSIS — Z79899 Other long term (current) drug therapy: Secondary | ICD-10-CM | POA: Diagnosis not present

## 2020-11-30 DIAGNOSIS — E782 Mixed hyperlipidemia: Secondary | ICD-10-CM

## 2020-11-30 DIAGNOSIS — E611 Iron deficiency: Secondary | ICD-10-CM | POA: Diagnosis not present

## 2020-11-30 DIAGNOSIS — Z0000 Encounter for general adult medical examination without abnormal findings: Secondary | ICD-10-CM

## 2020-11-30 DIAGNOSIS — R7309 Other abnormal glucose: Secondary | ICD-10-CM

## 2020-11-30 DIAGNOSIS — H4010X0 Unspecified open-angle glaucoma, stage unspecified: Secondary | ICD-10-CM

## 2020-11-30 DIAGNOSIS — I10 Essential (primary) hypertension: Secondary | ICD-10-CM

## 2020-11-30 NOTE — Progress Notes (Signed)
MEDICARE ANNUAL WELLNESS VISIT AND 3 MONTH FOLLOW UP  Assessment:    Encounter for Medicare annual wellness exam 1 year Send covid 19 information  Essential hypertension Continue current medications: Atenolol and quinapril Monitor blood pressure at home; call if consistently over 130/80 Continue DASH diet.   Reminder to go to the ER if any CP, SOB, nausea, dizziness, severe HA, changes vision/speech, left arm numbness and tingling and jaw pain. -     CBC with Differential/Platelet -     COMPLETE METABOLIC PANEL WITH GFR -     TSH  Hyperlipademia, mixed Continue medications: rosuvastatin 20mg  daily Discussed dietary and exercise modifications Low fat diet  Vascular dementia without behavioral disturbance (HCC) Taking namenda and aircept Stable, no concerns Follows with Dr. [name]  Gastroesophageal reflux disease, esophagitis presence not specified Continue PPI/H2 blocker, diet discussed  Malignant neoplasm of upper-outer quadrant of left breast in female, estrogen receptor positive ([place]) S/p mastectomy  Open-angle glaucoma of right eye, unspecified glaucoma stage, unspecified open-angle glaucoma type S/p surgery doing well  Mild malnutrition (HCC) Increase weight Monitor closely Improved with remeron  Hyperlipidemia, mixed -     Lipid panel check lipids decrease fatty foods increase activity.  Abnormal glucose Recent A1Cs at goal Discussed diet/exercise, weight management  Defer A1C; check CMP   Body mass index (BMI) 21 Increase weight Monitor closely DOing well with remeron   Medication management Continued  Vitamin D deficiency Continue supplement     Over 40 minutes of chart review, face to face exam, counseling, chart review and critical decision making was performed. Future Appointments  Date Time Provider [place]  04/20/2021 11:00 AM Unk Pinto, MD [facility] None  12/03/2021  2:30 PM Liane Comber, NP [facility] None      Plan:   During the course of the visit the patient was educated and counseled about appropriate screening and preventive services including:   Pneumococcal vaccine  Prevnar 13 Influenza vaccine Td vaccine Screening electrocardiogram Bone densitometry screening Colorectal cancer screening Diabetes screening Glaucoma screening Nutrition counseling  Advanced directives: requested   Subjective:  Shannon Nielsen is a 75 y.o. female who presents for Medicare Annual Wellness Visit and 3 month follow up.   She is accompanied by her husband, [name] today.  She has history of dementia and follows with Dr [name] for this in the past.  She is taking aricept and namenda.  She is alert and oriented x2.  She is pleasant with conversation. He manages meds and finances, driving. She has sitter while he is at work. Denies concerns today.   She has history of breast cancer (2011) s/p bilateral mastectomy and radiation complete Nov 2011.  She had neuropathy during treatments but has since resolved.  Had glaucoma and cataract surgery at [facility] 09/29/2018 with Dr. Arlis Porta. She is doing well and has routine eye exams.    BMI is Body mass index is 21.31 kg/m.  She has been walking 1/4 mile daily with husband. Appetite improved recently.  Wt Readings from Last 3 Encounters:  11/30/20 132 lb (59.9 kg)  08/22/20 128 lb 9.6 oz (58.3 kg)  05/11/20 131 lb (59.4 kg)   Her blood pressure has been controlled at home, today their BP is BP: 138/76  She does workout , gentle walking twice a week. She denies chest pain, shortness of breath, dizziness.   She is on cholesterol medication (rosuvastatin 20 mg daily) and denies myalgias. Her cholesterol is at goal. The cholesterol last visit was:  Lab Results  Component Value Date   CHOL 178 08/22/2020   HDL 74 08/22/2020   LDLCALC 89 08/22/2020   TRIG 61 08/22/2020   CHOLHDL 2.4 08/22/2020   She has a history of preDM but has decreased sugar and has  increased water. Last A1C in the office was:  Lab Results  Component Value Date   HGBA1C 5.5 08/22/2020   She has history of CKD borderline II/III at baseline for many years, denies NSAIDs. Trying to increase fluid intake. Last GFR: Lab Results  Component Value Date   GFRAA 55 (L) 08/22/2020   Patient is on Vitamin D supplement.   Lab Results  Component Value Date   VD25OH 48 08/22/2020        Medication Review: Current Outpatient Medications on File Prior to Visit  Medication Sig Dispense Refill   aspirin 81 MG tablet Take 81 mg by mouth daily.      atenolol (TENORMIN) 100 MG tablet Take  1 tablet  Daily  for BP 90 tablet 3   Cholecalciferol (VITAMIN D3) 1.25 MG (50000 UT) CAPS TAKE ONE CAPSULE THREE DAYS A WEEK FOR TWELVE WEEKS. 36 capsule 0   donepezil (ARICEPT) 10 MG tablet Take 1 tablet at Bedtime for Memory 90 tablet 3   fluticasone (FLONASE) 50 MCG/ACT nasal spray INSTILL 1 SPRAY INTO EACH NOSTRIL TWICE A DAY 48 mL 3   latanoprost (XALATAN) 0.005 % ophthalmic solution Place 1 drop into both eyes at bedtime.     letrozole (FEMARA) 2.5 MG tablet letrozole 2.5 mg tablet     memantine (NAMENDA) 10 MG tablet TAKE 1 TABLET BY MOUTH TWICE DAILY FOR  MEMORY 60 tablet 2   mirtazapine (REMERON) 15 MG tablet Take 1 tablet at Bedtime for Sleep  & Appetite 90 tablet 0   OVER THE COUNTER MEDICATION daily. B-Complex with 1000 mcg Biotin     rosuvastatin (CRESTOR) 20 MG tablet Take 1 tablet    Daily    For Cholesterol 90 tablet 0   No current facility-administered medications on file prior to visit.    Allergies  Allergen Reactions   Minocycline    Prednisone Swelling   Sulfa Antibiotics     Patient does not remember the type of reaction, happened years ago.   Zithromax [Azithromycin] Swelling   Ciprofloxacin Hcl Rash   Penicillins Rash    Current Problems (verified) Patient Active Problem List   Diagnosis Date Noted   Stage 3a chronic kidney disease ([place]) 11/30/2020   Mild  malnutrition ([place]) 10/01/2018   Vascular dementia without behavioral disturbance ([place]) 06/22/2018   B12 deficiency 06/22/2018   Abnormal glucose 06/22/2018   Memory changes 10/27/2017   Iron deficiency 11/30/2016   BMI 20.0-20.9, adult 05/08/2015   Open-angle glaucoma 05/08/2015   Medication management 08/30/2013   Vitamin D deficiency    GERD (gastroesophageal reflux disease)    Breast cancer of upper-outer quadrant of left female breast ([place]) 07/08/2011   Hyperlipidemia, mixed 10/18/2010   Essential hypertension 10/18/2010    Screening Tests Immunization History  Administered Date(s) Administered   Influenza, High Dose Seasonal PF 03/07/2014, 05/08/2015, 03/11/2017, 03/03/2018, 04/06/2019   PFIZER(Purple Top)SARS-COV-2 Vaccination 07/02/2019   PPD Test 08/30/2013, 10/04/2014, 10/24/2015   Pneumococcal Conjugate-13 03/24/2014   Pneumococcal Polysaccharide-23 09/08/2006, 03/11/2017   Tdap 08/27/2012   Zoster, Live 09/03/2012   Preventative care: Last colonoscopy: 2014 Last mammogram: 2011 s/p double mastectomy Last pap smear/pelvic exam: 2015 declines another DEXA:12/2019 osteopenia, R fem T-1.8  Prior vaccinations:  TD or Tdap: 2014  Influenza: 2021 Pneumococcal: 2018 Prevnar13: 2015 Shingles/Zostavax: 2014 - plans to get at [name] Covid 19: reports had 2/2 + boosters - requested send card  Names of Other Physician/Practitioners you currently use: 1. [facility] here for primary care 2. Delman Cheadle, eye doctor, last visit 11/2020 3. Dr. Lilian Coma dentist, last visit Q6 months, last 2022  Patient Care Team: Unk Pinto, MD as PCP - General (Internal Medicine) Clarene Essex, MD as Consulting Physician (Gastroenterology) Meda Klinefelter, MD as Consulting Physician (Endocrinology) Mordecai Rasmussen, MD as Consulting Physician (Surgery)  SURGICAL HISTORY She  has a past surgical history that includes Mastectomy (August 2011);  Parathyroidectomy; and Nasal sinus surgery. FAMILY HISTORY Her family history includes Alzheimer's disease in her mother; Cancer in her mother; Dementia in her maternal grandfather and maternal grandmother; Diabetes in her mother; Heart attack in her brother; Heart attack (age of onset: 84) in her father. SOCIAL HISTORY She  reports that she has never smoked. She has never used smokeless tobacco. She reports that she does not drink alcohol and does not use drugs.   MEDICARE WELLNESS OBJECTIVES: Physical activity: Current Exercise Habits: Home exercise routine, Type of exercise: walking, Time (Minutes): 20, Frequency (Times/Week): 2, Weekly Exercise (Minutes/Week): 40, Intensity: Mild, Exercise limited by: None identified Cardiac risk factors: Cardiac Risk Factors include: advanced age (>60men, >34 women);dyslipidemia;hypertension;sedentary lifestyle Depression/mood screen:   Depression screen [facility] 2/9 11/30/2020  Decreased Interest 0  Down, Depressed, Hopeless 0  PHQ - 2 Score 0    ADLs:  In your present state of health, do you have any difficulty performing the following activities: 11/30/2020 08/21/2020  Hearing? N N  Vision? N N  Difficulty concentrating or making decisions? Y -  Walking or climbing stairs? N N  Dressing or bathing? N N  Doing errands, shopping? Y N  Comment husband drives -  Conservation officer, nature and eating ? N -  Using the Toilet? N -  In the past six months, have you accidently leaked urine? Y -  Comment wears a pad for leaking -  Do you have problems with loss of bowel control? N -  Managing your Medications? Y -  Comment husband manages -  Managing your Finances? Y -  Comment husband manages -  Runner, broadcasting/film/video? N -  Some recent data might be hidden     Cognitive Testing  Alert? Yes  Normal Appearance?Yes  Oriented to person? Yes  Place? Yes   Time? Yes  Recall of three objects?  No, 1/3  Can perform simple calculations?  Yes  Displays appropriate judgment?Yes  Can read the correct time from a watch face?Yes  EOL planning: Does Patient Have a Medical Advance Directive?: No Would patient like information on creating a medical advance directive?: No - Patient declined  Review of Systems  Constitutional: Negative.  Negative for malaise/fatigue and weight loss.  HENT: Negative.  Negative for hearing loss and tinnitus.   Eyes: Negative.  Negative for blurred vision and double vision.  Respiratory: Negative.  Negative for cough, sputum production, shortness of breath and wheezing.   Cardiovascular: Negative.  Negative for chest pain, palpitations, orthopnea, claudication, leg swelling and PND.  Gastrointestinal: Negative.  Negative for abdominal pain, blood in stool, constipation, diarrhea, heartburn, melena, nausea and vomiting.  Genitourinary: Negative.   Musculoskeletal: Negative.  Negative for falls, joint pain and myalgias.  Skin: Negative.  Negative for rash.  Neurological: Negative.  Negative for  dizziness, tingling, sensory change, weakness and headaches.  Endo/Heme/Allergies: Negative.  Negative for polydipsia.  Psychiatric/Behavioral:  Positive for memory loss (stable). Negative for depression, substance abuse and suicidal ideas. The patient is not nervous/anxious and does not have insomnia.   All other systems reviewed and are negative.   Objective:     Today's Vitals   11/30/20 1428 11/30/20 1454  BP: 140/80 138/76  Pulse: (!) 51   Temp: (!) 97.3 F (36.3 C)   SpO2: 99%   Weight: 132 lb (59.9 kg)    Body mass index is 21.31 kg/m.  General appearance: alert, no distress, WD/WN, female HEENT: normocephalic, sclerae anicteric, TMs pearly, nares patent, no discharge or erythema, pharynx normal Oral cavity: MMM, no lesions Neck: supple, no lymphadenopathy, no thyromegaly, no masses Heart: RRR, normal S1, S2, no murmurs Lungs: CTA bilaterally, no wheezes, rhonchi, or rales Abdomen: +bs,  soft, non tender, non distended, no masses, no hepatomegaly, no splenomegaly Musculoskeletal: nontender, no swelling, no obvious deformity Extremities: no edema, no cyanosis, no clubbing Pulses: 2+ symmetric, upper and lower extremities, normal cap refill Neurological: alert, oriented x 2, CN2-12 intact, strength normal upper extremities and lower extremities, sensation normal throughout, DTRs 2+ throughout, no cerebellar signs, gait normal Psychiatric: normal affect, behavior normal, pleasant   Medicare Attestation I have personally reviewed: The patient's medical and social history Their use of alcohol, tobacco or illicit drugs Their current medications and supplements The patient's functional ability including ADLs,fall risks, home safety risks, cognitive, and hearing and visual impairment Diet and physical activities Evidence for depression or mood disorders  The patient's weight, height, BMI, and visual acuity have been recorded in the chart.  I have made referrals, counseling, and provided education to the patient based on review of the above and I have provided the patient with a written personalized care plan for preventive services.     Izora Ribas, NP   11/30/2020

## 2020-11-30 NOTE — Patient Instructions (Addendum)
Ms. Whitenack , Thank you for taking time to come for your Medicare Wellness Visit. I appreciate your ongoing commitment to your health goals. Please review the following plan we discussed and let me know if I can assist you in the future.   These are the goals we discussed:  Goals      Exercise 150 min/wk Moderate Activity        This is a list of the screening recommended for you and due dates:  Health Maintenance  Topic Date Due   Zoster (Shingles) Vaccine (1 of 2) Never done   COVID-19 Vaccine (2 - Pfizer risk series) 07/23/2019   Flu Shot  01/08/2021   Tetanus Vaccine  08/28/2022   Colon Cancer Screening  03/30/2023   DEXA scan (bone density measurement)  Completed   Hepatitis C Screening: USPSTF Recommendation to screen - Ages 19-79 yo.  Completed   Pneumonia vaccines  Completed   HPV Vaccine  Aged Out     Please send covid 19 vaccine information     Antiinflammatory diet  Doctors are learning that one of the best ways to reduce inflammation lies not in the medicine cabinet, but in the refrigerator.  By following an anti-inflammatory diet you can fight off inflammation for good.  What does an anti-inflammatory diet do? Your immune system becomes activated when your body recognizes anything that is foreign--such as an invading microbe, plant pollen, or chemical. This often triggers a process called inflammation. Intermittent bouts of inflammation directed at truly threatening invaders protect your health.  However, sometimes inflammation persists, day in and day out, even when you are not threatened by a foreign invader. That's when inflammation can become your enemy. Many major diseases that plague us--including cancer, heart disease, diabetes, arthritis, depression, and Alzheimer's--have been linked to chronic inflammation.  One of the most powerful tools to combat inflammation comes not from the pharmacy, but from the grocery store.   Protect yourself from the damage of  chronic inflammation. Science has proven that chronic, low-grade inflammation can turn into a silent killer that contributes to cardiovascular disease, cancer, type 2 diabetes and other conditions.   Choose the right anti-inflammatory foods, and you may be able to reduce your risk of illness. Consistently pick the wrong ones, and you could accelerate the inflammatory disease process.     Foods that cause inflammation Try to avoid or limit these foods as much as possible:  refined carbohydrates, such as white bread and pastries [place] fries and other fried foods soda and other sugar-sweetened beverages red meat (burgers, steaks) and processed meat (hot dogs, sausage) margarine, shortening, and lard  The health risks of inflammatory foods Not surprisingly, the same foods on an inflammation diet are generally considered bad for our health, including sodas and refined carbohydrates, as well as red meat and processed meats.  "Some of the foods that have been associated with an increased risk for chronic diseases such as type 2 diabetes and heart disease are also associated with excess inflammation," Dr. Melanee Spry says. "It's not surprising, since inflammation is an important underlying mechanism for the development of these diseases."  Unhealthy foods also contribute to weight gain, which is itself a risk factor for inflammation. Yet in several studies, even after researchers took obesity into account, the link between foods and inflammation remained, which suggests weight gain isn't the sole driver. "Some of the food components or ingredients may have independent effects on inflammation over and above increased caloric intake," Dr. Melanee Spry says.  Anti-inflammatory foods An anti-inflammatory diet should include these foods:  tomatoes olive oil green leafy vegetables, such as spinach, kale, and collards nuts like almonds and walnuts fatty fish like salmon, mackerel, tuna, and sardines fruits such as  strawberries, blueberries, cherries, and oranges  Benefits of anti-inflammatory foods On the flip side are beverages and foods that reduce inflammation, in particular fruits and vegetables such as blueberries, apples, and leafy greens that are high in natural antioxidants and polyphenols--protective compounds found in plants.  Studies have also associated nuts with reduced markers of inflammation and a lower risk of cardiovascular disease and diabetes. Coffee, which contains polyphenols and other anti-inflammatory compounds, may protect against inflammation, as well.  Anti-inflammatory diet To reduce levels of inflammation, aim for an overall healthy diet. If you're looking for an eating plan that closely follows the tenets of anti-inflammatory eating, consider the Mediterranean diet, which is high in fruits, vegetables, nuts, whole grains, fish, and healthy oils.  In addition to lowering inflammation, a more natural, less processed diet can have noticeable effects on your physical and emotional health and overall quality of life.   [email or web address]

## 2020-12-01 LAB — CBC WITH DIFFERENTIAL/PLATELET
Absolute Monocytes: 309 cells/uL (ref 200–950)
Basophils Absolute: 31 cells/uL (ref 0–200)
Basophils Relative: 0.9 %
Eosinophils Absolute: 31 cells/uL (ref 15–500)
Eosinophils Relative: 0.9 %
HCT: 44.5 % (ref 35.0–45.0)
Hemoglobin: 14.5 g/dL (ref 11.7–15.5)
Lymphs Abs: 1173 cells/uL (ref 850–3900)
MCH: 30.6 pg (ref 27.0–33.0)
MCHC: 32.6 g/dL (ref 32.0–36.0)
MCV: 93.9 fL (ref 80.0–100.0)
MPV: 10 fL (ref 7.5–12.5)
Monocytes Relative: 9.1 %
Neutro Abs: 1856 cells/uL (ref 1500–7800)
Neutrophils Relative %: 54.6 %
Platelets: 176 10*3/uL (ref 140–400)
RBC: 4.74 10*6/uL (ref 3.80–5.10)
RDW: 12.1 % (ref 11.0–15.0)
Total Lymphocyte: 34.5 %
WBC: 3.4 10*3/uL — ABNORMAL LOW (ref 3.8–10.8)

## 2020-12-01 LAB — COMPLETE METABOLIC PANEL WITH GFR
AG Ratio: 1.6 (calc) (ref 1.0–2.5)
ALT: 15 U/L (ref 6–29)
AST: 23 U/L (ref 10–35)
Albumin: 4.1 g/dL (ref 3.6–5.1)
Alkaline phosphatase (APISO): 99 U/L (ref 37–153)
BUN/Creatinine Ratio: 14 (calc) (ref 6–22)
BUN: 15 mg/dL (ref 7–25)
CO2: 29 mmol/L (ref 20–32)
Calcium: 10.1 mg/dL (ref 8.6–10.4)
Chloride: 108 mmol/L (ref 98–110)
Creat: 1.09 mg/dL — ABNORMAL HIGH (ref 0.60–0.93)
GFR, Est African American: 58 mL/min/{1.73_m2} — ABNORMAL LOW (ref >=60)
GFR, Est Non African American: 50 mL/min/{1.73_m2} — ABNORMAL LOW (ref >=60)
Globulin: 2.6 g/dL (calc) (ref 1.9–3.7)
Glucose, Bld: 90 mg/dL (ref 65–99)
Potassium: 4.7 mmol/L (ref 3.5–5.3)
Sodium: 142 mmol/L (ref 135–146)
Total Bilirubin: 0.8 mg/dL (ref 0.2–1.2)
Total Protein: 6.7 g/dL (ref 6.1–8.1)

## 2020-12-01 LAB — TSH: TSH: 0.68 mIU/L (ref 0.40–4.50)

## 2020-12-01 LAB — LIPID PANEL
Cholesterol: 171 mg/dL (ref <200)
HDL: 68 mg/dL (ref >=50)
LDL Cholesterol (Calc): 82 mg/dL (calc)
Non-HDL Cholesterol (Calc): 103 mg/dL (calc) (ref <130)
Total CHOL/HDL Ratio: 2.5 (calc) (ref <5.0)
Triglycerides: 111 mg/dL (ref <150)

## 2020-12-01 LAB — MAGNESIUM: Magnesium: 2.1 mg/dL (ref 1.5–2.5)

## 2020-12-07 ENCOUNTER — Other Ambulatory Visit: Payer: Self-pay | Admitting: Internal Medicine

## 2021-01-27 ENCOUNTER — Other Ambulatory Visit: Payer: Self-pay | Admitting: Adult Health

## 2021-01-27 DIAGNOSIS — F0150 Vascular dementia without behavioral disturbance: Secondary | ICD-10-CM

## 2021-01-27 DIAGNOSIS — R413 Other amnesia: Secondary | ICD-10-CM

## 2021-02-05 ENCOUNTER — Encounter: Payer: Medicare HMO | Admitting: Internal Medicine

## 2021-03-05 ENCOUNTER — Other Ambulatory Visit: Payer: Self-pay | Admitting: Internal Medicine

## 2021-03-05 DIAGNOSIS — R634 Abnormal weight loss: Secondary | ICD-10-CM
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
## 2021-04-04 ENCOUNTER — Ambulatory Visit: Payer: Medicare HMO | Admitting: Adult Health

## 2021-04-04 ENCOUNTER — Telehealth: Payer: Self-pay | Admitting: Adult Health

## 2021-04-04 NOTE — Telephone Encounter (Signed)
Appt cancelled due to NP out, LVM & sent mychart msg informing pt.

## 2021-04-20 ENCOUNTER — Other Ambulatory Visit: Payer: Self-pay

## 2021-04-20 ENCOUNTER — Encounter: Payer: Self-pay | Admitting: Internal Medicine

## 2021-04-20 ENCOUNTER — Ambulatory Visit (INDEPENDENT_AMBULATORY_CARE_PROVIDER_SITE_OTHER): Payer: Medicare HMO | Admitting: Internal Medicine

## 2021-04-20 VITALS — BP 153/83 | HR 69 | Temp 97.9°F | Resp 16 | Ht 66.0 in | Wt 134.4 lb

## 2021-04-20 DIAGNOSIS — I10 Essential (primary) hypertension: Secondary | ICD-10-CM | POA: Diagnosis not present

## 2021-04-20 DIAGNOSIS — Z136 Encounter for screening for cardiovascular disorders: Secondary | ICD-10-CM

## 2021-04-20 DIAGNOSIS — Z23 Encounter for immunization: Secondary | ICD-10-CM | POA: Diagnosis not present

## 2021-04-20 DIAGNOSIS — F0150 Vascular dementia without behavioral disturbance: Secondary | ICD-10-CM | POA: Diagnosis not present

## 2021-04-20 DIAGNOSIS — N1831 Chronic kidney disease, stage 3a: Secondary | ICD-10-CM

## 2021-04-20 DIAGNOSIS — Z0000 Encounter for general adult medical examination without abnormal findings: Secondary | ICD-10-CM | POA: Diagnosis not present

## 2021-04-20 DIAGNOSIS — Z8249 Family history of ischemic heart disease and other diseases of the circulatory system: Secondary | ICD-10-CM

## 2021-04-20 DIAGNOSIS — Z853 Personal history of malignant neoplasm of breast: Secondary | ICD-10-CM

## 2021-04-20 DIAGNOSIS — E559 Vitamin D deficiency, unspecified: Secondary | ICD-10-CM

## 2021-04-20 DIAGNOSIS — E441 Mild protein-calorie malnutrition: Secondary | ICD-10-CM | POA: Diagnosis not present

## 2021-04-20 DIAGNOSIS — E782 Mixed hyperlipidemia: Secondary | ICD-10-CM

## 2021-04-20 DIAGNOSIS — R69 Illness, unspecified: Secondary | ICD-10-CM | POA: Diagnosis not present

## 2021-04-20 DIAGNOSIS — Z0001 Encounter for general adult medical examination with abnormal findings: Secondary | ICD-10-CM

## 2021-04-20 DIAGNOSIS — Z79899 Other long term (current) drug therapy: Secondary | ICD-10-CM

## 2021-04-20 DIAGNOSIS — E210 Primary hyperparathyroidism: Secondary | ICD-10-CM

## 2021-04-20 DIAGNOSIS — Z1212 Encounter for screening for malignant neoplasm of rectum: Secondary | ICD-10-CM

## 2021-04-20 DIAGNOSIS — Z1211 Encounter for screening for malignant neoplasm of colon: Secondary | ICD-10-CM

## 2021-04-20 DIAGNOSIS — R7309 Other abnormal glucose: Secondary | ICD-10-CM

## 2021-04-20 MED ORDER — OLMESARTAN MEDOXOMIL 40 MG PO TABS: ORAL_TABLET | ORAL | 3 refills | Status: DC

## 2021-04-20 NOTE — Patient Instructions (Signed)

## 2021-04-20 NOTE — Progress Notes (Signed)
Annual Screening/Preventative Visit & Comprehensive Evaluation &  Examination  Future Appointments  Date Time Provider [place]  04/20/2021        CPE 11:00 AM Unk Pinto, MD [facility] None  04/25/2021  1:30 PM Ward Givens, NP [facility] None  12/03/2021        Wellness  2:30 PM Liane Comber, NP [facility] None  04/22/2022        CPE 11:00 AM Unk Pinto, MD [facility] None        This very nice 75 y.o. MBF presents for a Screening /Preventative Visit & comprehensive evaluation and management of multiple medical co-morbidities.  Patient has been followed for HTN, HLD, T2_NIDDM  Prediabetes  and Vitamin D Deficiency. Patient has GERD controlled with diet. Patient had excision of a parathyroid adenoma in 2001 for primary hyperparathyroidism.                                                     Patient also has Vascular Dementia & is on Aricept/Namenda followed by Dr [name] .  Patient is capable of ADLs /personal hygiene, but requires moderate supervision & guidance per spouse.           HTN predates since 89. Patient's BP has been controlled at home. Patient has CKD3a  (GFR 57) consequent of her HTCVD.  and patient denies any cardiac symptoms as chest pain, palpitations, shortness of breath, dizziness or ankle swelling. Today's BP: is elevated at 153/83 and HR is slow at 53/min on EKG. Per husband , no noted postural presyncopal sx's.         Patient's hyperlipidemia is controlled with diet and Rosuvastatin. Patient denies myalgias or other medication SE's. Last lipids were at goal :  Lab Results  Component Value Date   CHOL 171 11/30/2020   HDL 68 11/30/2020   LDLCALC 82 11/30/2020   TRIG 111 11/30/2020   CHOLHDL 2.5 11/30/2020         Patient has hx/o prediabetes (A1c 6.0% /2011) and patient denies reactive hypoglycemic symptoms, visual blurring, diabetic polys or paresthesias. Last A1c was normal & at goal :  Lab Results  Component Value Date   HGBA1C  5.5 08/22/2020         Finally, patient has history of Vitamin D Deficiency ("13" /2008) and last Vitamin D was slightly low (goal 70-100) :  Lab Results  Component Value Date   VD25OH 48 08/22/2020     Current Outpatient Medications on File Prior to Visit  Medication Sig   aspirin 81 MG tablet Take  daily.    atenolol  100 MG tablet Take  1 tablet  Daily  for BP   VITAMIN D 50,000 u TAKE ONE CAP THREE DAYS A WEEK    donepezil (ARICEPT) 10 MG tablet Take 1 tablet at Bedtime for Memory   FLONASE  nasal spray 1 SPRAY INTO EACH NOSTRIL TWICE A DAY   XALATAN ophth  soln Place 1 drop into both eyes at bedtime.   letrozole (FEMARA) 2.5 MG tablet letrozole 2.5 mg tablet   memantine (NAMENDA) 10 MG tablet Take  1 tablet  2 x day    mirtazapine  15 MG tablet TAKE 1 TABLET AT BEDTIME    B-Complex with 1000 mcg Biotin daily   rosuvastatin 20 MG tablet TAKE 1 TABLET DAILY  FOR CHOLESTEROL      Allergies  Allergen Reactions   Minocycline    Prednisone Swelling   Sulfa Antibiotics     Patient does not remember the type of reaction, happened years ago.   Zithromax [Azithromycin] Swelling   Ciprofloxacin Hcl Rash   Penicillins Rash     Past Medical History:  Diagnosis Date   Breast cancer ([place])    Breast cancer of upper-outer quadrant of left female breast ([place]) 07/08/2011   Chest tightness    GERD (gastroesophageal reflux disease)    Hepatitis C virus screening Negative 08/31/2012 03/08/2018   Hyperlipidemia    Hyperparathyroidism, primary ([place]) 08/12/2012   Hypertension    Palpitations    Prediabetes    Vitamin D deficiency      Health Maintenance  Topic Date Due   Zoster Vaccines- Shingrix (1 of 2) Never done   COVID-19 Vaccine (2 - Pfizer risk series) 07/23/2019   INFLUENZA VACCINE  01/08/2021   TETANUS/TDAP  08/28/2022   COLONOSCOPY  03/30/2023   Pneumonia Vaccine 10+ Years old  Completed   DEXA SCAN  Completed   Hepatitis C Screening  Completed   HPV VACCINES  Aged  Out     Immunization History  Administered Date(s) Administered   Influenza, High Dose  03/03/2018, 04/06/2019   PFIZER SARS-COV-2 Vacc 07/02/2019   PPD Test 08/30/2013, 10/04/2014, 10/24/2015   Pneumococcal -13 03/24/2014   Pneumococcal -23 09/08/2006, 03/11/2017   Tdap 08/27/2012   Zoster, Live 09/03/2012    Last Colon -  04/02/2013 - Dr Watt Climes - recc 5 yr F/U - overdue    Last MGM - Last 2015 - s/p Bilat Mastectomies 2011   Past Surgical History:  Procedure Laterality Date   MASTECTOMY  August 2011   Bilateral   NASAL SINUS SURGERY     PARATHYROIDECTOMY       Family History  Problem Relation Age of Onset   Diabetes Mother    Cancer Mother        breast   Alzheimer's disease Mother    Heart attack Father 32   Heart attack Brother    Dementia Maternal Grandmother    Dementia Maternal Grandfather      Social History   Tobacco Use   Smoking status: Never   Smokeless tobacco: Never  Vaping Use   Vaping Use: Never used  Substance Use Topics   Alcohol use: No   Drug use: Never      ROS Constitutional: Denies fever, chills, weight loss/gain, headaches, insomnia,  night sweats, and change in appetite. Does c/o fatigue. Eyes: Denies redness, blurred vision, diplopia, discharge, itchy, watery eyes.  ENT: Denies discharge, congestion, post nasal drip, epistaxis, sore throat, earache, hearing loss, dental pain, Tinnitus, Vertigo, Sinus pain, snoring.  Cardio: Denies chest pain, palpitations, irregular heartbeat, syncope, dyspnea, diaphoresis, orthopnea, PND, claudication, edema Respiratory: denies cough, dyspnea, DOE, pleurisy, hoarseness, laryngitis, wheezing.  Gastrointestinal: Denies dysphagia, heartburn, reflux, water brash, pain, cramps, nausea, vomiting, bloating, diarrhea, constipation, hematemesis, melena, hematochezia, jaundice, hemorrhoids Genitourinary: Denies dysuria, frequency, urgency, nocturia, hesitancy, discharge, hematuria, flank pain Breast:  Breast lumps, nipple discharge, bleeding.  Musculoskeletal: Denies arthralgia, myalgia, stiffness, Jt. Swelling, pain, limp, and strain/sprain. Denies falls. Skin: Denies puritis, rash, hives, warts, acne, eczema, changing in skin lesion Neuro: No weakness, tremor, incoordination, spasms, paresthesia, pain Psychiatric: Denies confusion, memory loss, sensory loss. Denies Depression. Endocrine: Denies change in weight, skin, hair change, nocturia, and paresthesia, diabetic polys, visual blurring, hyper / hypo glycemic episodes.  Heme/Lymph: No excessive bleeding, bruising, enlarged lymph nodes.  Physical Exam  BP (!) 153/83   Pulse 69   Temp 97.9 F (36.6 C)   Resp 16   Ht 5\' 6"  (1.676 m)   Wt 134 lb 6.4 oz (61 kg)   SpO2 96%   BMI 21.69 kg/m   General Appearance: Very thin habitus and in no apparent distress.  Eyes: PERRLA, EOMs, conjunctiva no swelling or erythema, normal fundi and vessels. Sinuses: No frontal/maxillary tenderness ENT/Mouth: EACs patent / TMs  nl. Nares clear without erythema, swelling, mucoid exudates. Oral hygiene is good. No erythema, swelling, or exudate. Tongue normal, non-obstructing. Tonsils not swollen or erythematous. Hearing normal.  Neck: Supple, thyroid not palpable. No bruits, nodes or JVD. Respiratory: Respiratory effort normal.  BS equal and clear bilateral without rales, rhonci, wheezing or stridor. Cardio: Heart sounds are normal with regular rate and rhythm and no murmurs, rubs or gallops. Peripheral pulses are normal and equal bilaterally without edema. No aortic or femoral bruits. Chest: symmetric with normal excursions and percussion. Breasts: Surgically absent w/o  lumps,  retractions, or palpable breast tissue..  Abdomen: Flat, soft with bowel sounds active. Nontender, no guarding, rebound, hernias, masses, or organomegaly.  Lymphatics: Non tender without lymphadenopathy.  Musculoskeletal: Full ROM all peripheral extremities, joint stability,  5/5 strength, and normal gait. Skin: Warm and dry without rashes, lesions, cyanosis, clubbing or  ecchymosis.  Neuro: Cranial nerves intact, reflexes equal bilaterally. (+) Snout & palmo-mental reflexes.  Normal muscle tone, no cerebellar symptoms. Sensation intact.  Pysch: Alert and oriented  x 1-2, pleasant  affect, Insight and Judgment very limited     Assessment and Plan  1. Annual Preventative Screening Examination   2. Essential hypertension  - Advised taper Atenolol 100 mg to 1/2 tab = 50 mg /daiolt due to Bradycardia  &   - Sent new Rx for Benicar /Olmesartan 40 mg to take qhs.    - EKG 12-Lead - Urinalysis, Routine w reflex microscopic - Microalbumin / creatinine urine ratio - CBC with Differential/Platelet - COMPLETE METABOLIC PANEL WITH GFR - Magnesium - TSH  3. Hyperlipidemia, mixed  - EKG 12-Lead - Lipid panel - TSH  4. Abnormal glucose  - EKG 12-Lead - Hemoglobin A1c - Insulin, random  5. Vitamin D deficiency  - VITAMIN D 25 Hydroxy   6. Vascular dementia without behavioral disturbance (HCC)  - Lipid panel  7. Stage 3a chronic kidney disease (HCC)  - Urinalysis, Routine w reflex microscopic - Microalbumin / creatinine urine ratio - COMPLETE METABOLIC PANEL WITH GFR  8. Mild malnutrition (HCC)  - COMPLETE METABOLIC PANEL WITH GFR  9. Hyperparathyroidism, primary ([place])   10. Screening for colorectal cancer  - Cologuard  11. Screening for ischemic heart disease  - EKG 12-Lead  12. FHx: heart disease  - EKG 12-Lead  13. History of breast cancer   14. Medication management  - Urinalysis, Routine w reflex microscopic - Microalbumin / creatinine urine ratio - CBC with Differential/Platelet - COMPLETE METABOLIC PANEL WITH GFR - Magnesium - Lipid panel - TSH - Hemoglobin A1c - Insulin, random - VITAMIN D 25 Hydroxy  15. Need for immunization against influenza  - Flu vaccine HIGH DOSE PF (Fluzone High  dose)           Patient was counseled in prudent diet to achieve/maintain BMI less than 25 for weight control, BP monitoring, regular exercise and medications. Discussed med's effects and SE's. Screening labs and tests as requested  with regular follow-up as recommended. Over 40 minutes of exam, counseling, chart review and high complex critical decision making was performed.   Kirtland Bouchard, MD

## 2021-04-23 LAB — CBC WITH DIFFERENTIAL/PLATELET
Absolute Monocytes: 291 cells/uL (ref 200–950)
Basophils Absolute: 21 cells/uL (ref 0–200)
Basophils Relative: 0.6 %
Eosinophils Absolute: 11 cells/uL — ABNORMAL LOW (ref 15–500)
Eosinophils Relative: 0.3 %
HCT: 45.2 % — ABNORMAL HIGH (ref 35.0–45.0)
Hemoglobin: 15.4 g/dL (ref 11.7–15.5)
Lymphs Abs: 879 cells/uL (ref 850–3900)
MCH: 31.4 pg (ref 27.0–33.0)
MCHC: 34.1 g/dL (ref 32.0–36.0)
MCV: 92.1 fL (ref 80.0–100.0)
MPV: 10.1 fL (ref 7.5–12.5)
Monocytes Relative: 8.3 %
Neutro Abs: 2300 cells/uL (ref 1500–7800)
Neutrophils Relative %: 65.7 %
Platelets: 161 10*3/uL (ref 140–400)
RBC: 4.91 10*6/uL (ref 3.80–5.10)
RDW: 11.9 % (ref 11.0–15.0)
Total Lymphocyte: 25.1 %
WBC: 3.5 10*3/uL — ABNORMAL LOW (ref 3.8–10.8)

## 2021-04-23 LAB — LIPID PANEL
Cholesterol: 182 mg/dL (ref <200)
HDL: 74 mg/dL (ref >=50)
LDL Cholesterol (Calc): 91 mg/dL (calc)
Non-HDL Cholesterol (Calc): 108 mg/dL (calc) (ref <130)
Total CHOL/HDL Ratio: 2.5 (calc) (ref <5.0)
Triglycerides: 80 mg/dL (ref <150)

## 2021-04-23 LAB — COMPLETE METABOLIC PANEL WITH GFR
AG Ratio: 1.6 (calc) (ref 1.0–2.5)
ALT: 18 U/L (ref 6–29)
AST: 22 U/L (ref 10–35)
Albumin: 4.4 g/dL (ref 3.6–5.1)
Alkaline phosphatase (APISO): 99 U/L (ref 37–153)
BUN/Creatinine Ratio: 14 (calc) (ref 6–22)
BUN: 16 mg/dL (ref 7–25)
CO2: 27 mmol/L (ref 20–32)
Calcium: 10.7 mg/dL — ABNORMAL HIGH (ref 8.6–10.4)
Chloride: 107 mmol/L (ref 98–110)
Creat: 1.12 mg/dL — ABNORMAL HIGH (ref 0.60–1.00)
Globulin: 2.8 g/dL (calc) (ref 1.9–3.7)
Glucose, Bld: 98 mg/dL (ref 65–99)
Potassium: 4.3 mmol/L (ref 3.5–5.3)
Sodium: 143 mmol/L (ref 135–146)
Total Bilirubin: 1.1 mg/dL (ref 0.2–1.2)
Total Protein: 7.2 g/dL (ref 6.1–8.1)
eGFR: 52 mL/min/{1.73_m2} — ABNORMAL LOW (ref >=60)

## 2021-04-23 LAB — URINALYSIS, ROUTINE W REFLEX MICROSCOPIC
Bilirubin Urine: NEGATIVE
Glucose, UA: NEGATIVE
Hgb urine dipstick: NEGATIVE
Ketones, ur: NEGATIVE
Leukocytes,Ua: NEGATIVE
Nitrite: NEGATIVE
Protein, ur: NEGATIVE
Specific Gravity, Urine: 1.023 (ref 1.001–1.035)
pH: 5.5 (ref 5.0–8.0)

## 2021-04-23 LAB — VITAMIN D 25 HYDROXY (VIT D DEFICIENCY, FRACTURES): Vit D, 25-Hydroxy: 70 ng/mL (ref 30–100)

## 2021-04-23 LAB — HEMOGLOBIN A1C
Hgb A1c MFr Bld: 5.7 % of total Hgb — ABNORMAL HIGH (ref <5.7)
Mean Plasma Glucose: 117 mg/dL
eAG (mmol/L): 6.5 mmol/L

## 2021-04-23 LAB — MICROALBUMIN / CREATININE URINE RATIO
Creatinine, Urine: 207 mg/dL (ref 20–275)
Microalb Creat Ratio: 8 mcg/mg creat (ref <30)
Microalb, Ur: 1.7 mg/dL

## 2021-04-23 LAB — INSULIN, RANDOM: Insulin: 10.6 u[IU]/mL

## 2021-04-23 LAB — TSH: TSH: 0.58 mIU/L (ref 0.40–4.50)

## 2021-04-23 LAB — MAGNESIUM: Magnesium: 1.9 mg/dL (ref 1.5–2.5)

## 2021-04-25 ENCOUNTER — Ambulatory Visit: Payer: Medicare HMO | Admitting: Adult Health

## 2021-04-25 ENCOUNTER — Encounter: Payer: Self-pay | Admitting: Adult Health

## 2021-04-25 VITALS — BP 122/74 | HR 49 | Ht 66.0 in | Wt 138.4 lb

## 2021-04-25 DIAGNOSIS — F0280 Dementia in other diseases classified elsewhere without behavioral disturbance: Secondary | ICD-10-CM | POA: Diagnosis not present

## 2021-04-25 DIAGNOSIS — R69 Illness, unspecified: Secondary | ICD-10-CM | POA: Diagnosis not present

## 2021-04-25 DIAGNOSIS — G309 Alzheimer's disease, unspecified: Secondary | ICD-10-CM | POA: Diagnosis not present

## 2021-04-25 NOTE — Progress Notes (Signed)
PATIENT: Shandale Malak Mckendree DOB: December 28, 1945  REASON FOR VISIT: follow up HISTORY FROM: patient   HISTORY OF PRESENT ILLNESS: Today 04/25/21:  Ms. Waggoner is a 75 year old female with a history of memory disturbance.  She returns today for follow-up.  The patient has not been seen since 2020.  Her husband feels that her memory has declined over the past 2 years.  She still lives at home with him.  She is able to complete most ADLs independently.  He does have to remind her to eat.  The patient's husband manages her medications, appointments and all the finances.  No change in mood or behavior.  The patient has continued on Aricept and Namenda.  Returns today for an evaluation.  HISTORY 02/23/19: Ms. Lizotte is a 75 year old female with a history of memory disturbance.  She returns today for follow-up.  She feels that overall her memory has remained stable.  She remains on Aricept and Namenda.  She lives at home with her husband.  She is able to complete all ADLs independently.  She no longer operates a motor vehicle.  She does some cooking but her husband tries to supervise.  She manages her medications.  Her husband manages the finances.  She denies any changes in her mood or behavior.  Has report that he still walks.  He states that they are looking to have someone stay with her when he is at work.  She returns today for evaluation.  REVIEW OF SYSTEMS: Out of a complete 14 system review of symptoms, the patient complains only of the following symptoms, and all other reviewed systems are negative.  See HPI  ALLERGIES: Allergies  Allergen Reactions   Minocycline    Prednisone Swelling   Sulfa Antibiotics     Patient does not remember the type of reaction, happened years ago.   Zithromax [Azithromycin] Swelling   Ciprofloxacin Hcl Rash   Penicillins Rash    HOME MEDICATIONS: Outpatient Medications Prior to Visit  Medication Sig Dispense Refill   aspirin 81 MG tablet Take 81 mg by mouth  daily.      atenolol (TENORMIN) 100 MG tablet Take  1 tablet  Daily  for BP 90 tablet 3   Cholecalciferol (VITAMIN D3) 1.25 MG (50000 UT) CAPS TAKE ONE CAPSULE THREE DAYS A WEEK FOR TWELVE WEEKS. 36 capsule 0   donepezil (ARICEPT) 10 MG tablet Take 1 tablet at Bedtime for Memory 90 tablet 3   fluticasone (FLONASE) 50 MCG/ACT nasal spray INSTILL 1 SPRAY INTO EACH NOSTRIL TWICE A DAY 48 mL 3   latanoprost (XALATAN) 0.005 % ophthalmic solution Place 1 drop into both eyes at bedtime.     memantine (NAMENDA) 10 MG tablet Take  1 tablet  2 x day  for Memory  / Patient knows to take by mouth 180 tablet 3   mirtazapine (REMERON) 15 MG tablet TAKE 1 TABLET BY MOUTH AT BEDTIME FOR SLEEP AND  APPETITE 90 tablet 0   olmesartan (BENICAR) 40 MG tablet Take  1 tablet  at Bedtime  for BP 90 tablet 3   OVER THE COUNTER MEDICATION daily. B-Complex with 1000 mcg Biotin     rosuvastatin (CRESTOR) 20 MG tablet TAKE 1 TABLET BY MOUTH ONCE DAILY FOR CHOLESTEROL 90 tablet 1   letrozole (FEMARA) 2.5 MG tablet letrozole 2.5 mg tablet     No facility-administered medications prior to visit.    PAST MEDICAL HISTORY: Past Medical History:  Diagnosis Date   Breast cancer (  [place])    Breast cancer of upper-outer quadrant of left female breast ([place]) 07/08/2011   Chest tightness    GERD (gastroesophageal reflux disease)    Hepatitis C virus screening Negative 08/31/2012 03/08/2018   Hyperlipidemia    Hyperparathyroidism, primary ([place]) 08/12/2012   Hypertension    Palpitations    Prediabetes    Vitamin D deficiency     PAST SURGICAL HISTORY: Past Surgical History:  Procedure Laterality Date   MASTECTOMY  August 2011   Bilateral   NASAL SINUS SURGERY     PARATHYROIDECTOMY      FAMILY HISTORY: Family History  Problem Relation Age of Onset   Diabetes Mother    Cancer Mother        breast   Alzheimer's disease Mother    Heart attack Father 29   Heart attack Brother    Dementia Maternal Grandmother    Dementia  Maternal Grandfather     SOCIAL HISTORY: Social History   Socioeconomic History   Marital status: Married    Spouse name: Not on file   Number of children: 1   Years of education: Not on file   Highest education level: Bachelor's degree (e.g., BA, AB, BS)  Occupational History   Not on file  Tobacco Use   Smoking status: Never   Smokeless tobacco: Never  Vaping Use   Vaping Use: Never used  Substance and Sexual Activity   Alcohol use: No   Drug use: Never   Sexual activity: Not on file  Other Topics Concern   Not on file  Social History Narrative   Lives at home with her husband   Right handed   No caffeine   Social Determinants of Health   Financial Resource Strain: Not on file  Food Insecurity: Not on file  Transportation Needs: Not on file  Physical Activity: Not on file  Stress: Not on file  Social Connections: Not on file  Intimate Partner Violence: Not on file      PHYSICAL EXAM  Vitals:   04/25/21 1333  BP: 122/74  Pulse: (!) 49  Weight: 138 lb 6.4 oz (62.8 kg)  Height: 5\' 6"  (1.676 m)   Body mass index is 22.34 kg/m.  MMSE - Mini Mental State Exam 04/25/2021 10/19/2019 02/23/2019  Orientation to time 2 4 4   Orientation to Place 4 5 5   Registration 3 3 3   Attention/ Calculation 0 5 1  Recall 0 3 0  Language- name 2 objects 2 2 2   Language- repeat 1 1 1   Language- follow 3 step command 3 3 3   Language- read & follow direction 1 1 1   Write a sentence 0 1 1  Copy design 1 1 1   Copy design-comments - - named 7 animals  Total score 17 29 22      Generalized: Well developed, in no acute distress   Neurological examination  Mentation: Alert. Follows all commands speech and language fluent Cranial nerve II-XII: Pupils were equal round reactive to light. Extraocular movements were full, visual field were full on confrontational test.  Head turning and shoulder shrug  were normal and symmetric. Motor: The motor testing reveals 5 over 5 strength  of all 4 extremities. Good symmetric motor tone is noted throughout.  Sensory: Sensory testing is intact to soft touch on all 4 extremities. No evidence of extinction is noted.  Coordination: Cerebellar testing reveals good finger-nose-finger and heel-to-shin bilaterally.  Some difficulty following directions with finger-nose-finger Gait and station: Gait is normal.  Reflexes: Deep tendon reflexes are symmetric and normal bilaterally.   DIAGNOSTIC DATA (LABS, IMAGING, TESTING) - I reviewed patient records, labs, notes, testing and imaging myself where available.  Lab Results  Component Value Date   WBC 3.5 (L) 04/20/2021   HGB 15.4 04/20/2021   HCT 45.2 (H) 04/20/2021   MCV 92.1 04/20/2021   PLT 161 04/20/2021      Component Value Date/Time   NA 143 04/20/2021 1058   NA 140 01/25/2016 1522   K 4.3 04/20/2021 1058   K 4.2 01/25/2016 1522   CL 107 04/20/2021 1058   CL 104 07/28/2012 1437   CO2 27 04/20/2021 1058   CO2 28 01/25/2016 1522   GLUCOSE 98 04/20/2021 1058   GLUCOSE 96 01/25/2016 1522   GLUCOSE 103 (H) 07/28/2012 1437   BUN 16 04/20/2021 1058   BUN 14.9 01/25/2016 1522   CREATININE 1.12 (H) 04/20/2021 1058   CREATININE 1.0 01/25/2016 1522   CALCIUM 10.7 (H) 04/20/2021 1058   CALCIUM 10.2 01/25/2016 1522   CALCIUM 10.5 (H) 01/25/2016 1522   PROT 7.2 04/20/2021 1058   PROT 7.2 01/25/2016 1522   ALBUMIN 4.6 11/18/2016 1044   ALBUMIN 4.0 01/25/2016 1522   AST 22 04/20/2021 1058   AST 23 01/25/2016 1522   ALT 18 04/20/2021 1058   ALT 16 01/25/2016 1522   ALKPHOS 95 11/18/2016 1044   ALKPHOS 108 01/25/2016 1522   BILITOT 1.1 04/20/2021 1058   BILITOT 0.88 01/25/2016 1522   GFRNONAA 50 (L) 11/30/2020 1515   GFRAA 58 (L) 11/30/2020 1515   Lab Results  Component Value Date   CHOL 182 04/20/2021   HDL 74 04/20/2021   LDLCALC 91 04/20/2021   TRIG 80 04/20/2021   CHOLHDL 2.5 04/20/2021   Lab Results  Component Value Date   HGBA1C 5.7 (H) 04/20/2021   Lab  Results  Component Value Date   VITAMINB12 661 12/31/2018   Lab Results  Component Value Date   TSH 0.58 04/20/2021      ASSESSMENT AND PLAN 75 y.o. year old female  has a past medical history of Breast cancer ([place]), Breast cancer of upper-outer quadrant of left female breast ([place]) (07/08/2011), Chest tightness, GERD (gastroesophageal reflux disease), Hepatitis C virus screening Negative 08/31/2012 (03/08/2018), Hyperlipidemia, Hyperparathyroidism, primary ([place]) (08/12/2012), Hypertension, Palpitations, Prediabetes, and Vitamin D deficiency. here with:  1.  Memory disturbance  MMSE 17 out of 30 previously 22 out of 30 Continue Aricept 10 mg at bedtime Continue Namenda 10 mg twice a day We will continue to follow her memory over time. Follow-up in 6 months or sooner if needed   I spent 33 minutes of face-to-face and non-face-to-face time with patient.  This included previsit chart review, review of imaging.  Discussion of diagnosis and treatment options.   Ward Givens, MSN, NP-C 04/25/2021, 1:40 PM [facility] [address] [place], [place] [address] [phone]

## 2021-04-26 DIAGNOSIS — Z1211 Encounter for screening for malignant neoplasm of colon: Secondary | ICD-10-CM | POA: Diagnosis not present

## 2021-04-26 DIAGNOSIS — Z1212 Encounter for screening for malignant neoplasm of rectum: Secondary | ICD-10-CM | POA: Diagnosis not present

## 2021-05-02 LAB — COLOGUARD: COLOGUARD: NEGATIVE

## 2021-05-03 NOTE — Progress Notes (Signed)
============================================================ -   Test results slightly outside the reference range are not unusual. If there is anything important, I will review this with you,  otherwise it is considered normal test values.  If you have further questions,  please do not hesitate to contact me at the office or via My Chart.  ============================================================ ============================================================  -  Cologard test is neg  so no Colonoscopy needed Now   - Recommended repeat the Cologard in 3 years. ============================================================ ============================================================

## 2021-06-14 ENCOUNTER — Ambulatory Visit: Payer: Self-pay | Admitting: Adult Health

## 2021-06-19 ENCOUNTER — Other Ambulatory Visit: Payer: Self-pay | Admitting: Nurse Practitioner

## 2021-06-19 DIAGNOSIS — R634 Abnormal weight loss: Secondary | ICD-10-CM

## 2021-07-03 NOTE — Progress Notes (Signed)
Assessment and Plan:  Shannon Nielsen was seen today for acute visit.  Diagnoses and all orders for this visit:  Vascular dementia without behavioral disturbance ([place]) Taking namenda and aircept Stable, no concerns Follows with Dr. [name]  Other hemorrhoids -     pramoxine (PROCTOFOAM) 1 % foam; Place 1 application rectally 3 (three) times daily as needed for anal itching.  Use warm soaks  Increase fiber and water, avoid straining  Right hip pain -     meloxicam (MOBIC) 7.5 MG tablet; Take 1 tablet (7.5 mg total) by mouth daily. -     DG Hip Unilat W OR W/O Pelvis Min 4 Views Right; Future Continue heat and monitor        Further disposition pending results of labs. Discussed med's effects and SE's.   Over 30 minutes of exam, counseling, chart review, and critical decision making was performed.   Future Appointments  Date Time Provider [place]  07/25/2021 11:30 AM Liane Comber, NP [facility] None  10/25/2021  1:30 PM Ward Givens, NP [facility] None  12/04/2021  2:00 PM Liane Comber, NP [facility] None  04/22/2022 11:00 AM Unk Pinto, MD [facility] None    ------------------------------------------------------------------------------------------------------------------   HPI BP 128/78    Pulse 69    Temp 97.8 F (36.6 C)    Resp 16    Ht 5\' 6"  (1.676 m)    Wt 137 lb 12.8 oz (62.5 kg)    SpO2 96%    BMI 22.24 kg/m  76 y.o.female presents for pain in right hip which she describes as an aching pain and has been present for 10 days. Hemorrhoids have been an issue for many years, have been bleeding recently from the hemorrhoids. She has been drinking water and eating foods high in fiber.   Past Medical History:  Diagnosis Date   Breast cancer ([place])    Breast cancer of upper-outer quadrant of left female breast ([place]) 07/08/2011   Chest tightness    GERD (gastroesophageal reflux disease)    Hepatitis C virus screening Negative 08/31/2012 03/08/2018    Hyperlipidemia    Hyperparathyroidism, primary ([place]) 08/12/2012   Hypertension    Palpitations    Prediabetes    Vitamin D deficiency      Allergies  Allergen Reactions   Minocycline    Prednisone Swelling   Sulfa Antibiotics     Patient does not remember the type of reaction, happened years ago.   Zithromax [Azithromycin] Swelling   Ciprofloxacin Hcl Rash   Penicillins Rash    Current Outpatient Medications on File Prior to Visit  Medication Sig   aspirin 81 MG tablet Take 81 mg by mouth daily.    atenolol (TENORMIN) 100 MG tablet Take  1 tablet  Daily  for BP   Cholecalciferol (VITAMIN D3) 1.25 MG (50000 UT) CAPS TAKE ONE CAPSULE THREE DAYS A WEEK FOR TWELVE WEEKS.   donepezil (ARICEPT) 10 MG tablet Take 1 tablet at Bedtime for Memory   fluticasone (FLONASE) 50 MCG/ACT nasal spray INSTILL 1 SPRAY INTO EACH NOSTRIL TWICE A DAY   latanoprost (XALATAN) 0.005 % ophthalmic solution Place 1 drop into both eyes at bedtime.   letrozole (FEMARA) 2.5 MG tablet letrozole 2.5 mg tablet   memantine (NAMENDA) 10 MG tablet Take  1 tablet  2 x day  for Memory  / Patient knows to take by mouth   mirtazapine (REMERON) 15 MG tablet TAKE 1 TABLET BY MOUTH AT BEDTIME AS NEEDED FOR SLEEP AND  APPETITE  olmesartan (BENICAR) 40 MG tablet Take  1 tablet  at Bedtime  for BP   OVER THE COUNTER MEDICATION daily. B-Complex with 1000 mcg Biotin   rosuvastatin (CRESTOR) 20 MG tablet TAKE 1 TABLET BY MOUTH ONCE DAILY FOR CHOLESTEROL   No current facility-administered medications on file prior to visit.    ROS: all negative except above.   Physical Exam:  BP 128/78    Pulse 69    Temp 97.8 F (36.6 C)    Resp 16    Ht 5\' 6"  (1.676 m)    Wt 137 lb 12.8 oz (62.5 kg)    SpO2 96%    BMI 22.24 kg/m   General Appearance: Well nourished female, in no apparent distress. Eyes: PERRLA, EOMs, conjunctiva no swelling or erythema Sinuses: No Frontal/maxillary tenderness ENT/Mouth: Ext aud canals clear, TMs  without erythema, bulging. No erythema, swelling, or exudate on post pharynx.  Tonsils not swollen or erythematous. Hearing normal.  Neck: Supple, thyroid normal.  Respiratory: Respiratory effort normal, BS equal bilaterally without rales, rhonchi, wheezing or stridor.  Cardio: RRR with no MRGs. Brisk peripheral pulses without edema.  Abdomen: Soft, + BS.  Non tender, no guarding, rebound, hernias, masses. Lymphatics: Non tender without lymphadenopathy.  Musculoskeletal: Full ROM, 5/5 strength, normal gait. Unable to reproduce right hip pain on exam Skin: Warm, dry without rashes, lesions, ecchymosis.  Neuro: Cranial nerves intact. Normal muscle tone, no cerebellar symptoms. Sensation intact.  Psych:Normal affect, behavior normal, pleasant    Daundre Biel Kathyrn Drown, NP 10:06 AM [facility]

## 2021-07-05 ENCOUNTER — Other Ambulatory Visit: Payer: Self-pay | Admitting: Nurse Practitioner

## 2021-07-05 ENCOUNTER — Ambulatory Visit (INDEPENDENT_AMBULATORY_CARE_PROVIDER_SITE_OTHER): Payer: Medicare HMO | Admitting: Nurse Practitioner

## 2021-07-05 ENCOUNTER — Ambulatory Visit
Admission: RE | Admit: 2021-07-05 | Discharge: 2021-07-05 | Disposition: A | Discharge status: Home / Self Care | Payer: Medicare HMO | Source: Ambulatory Visit | Attending: Nurse Practitioner | Admitting: Nurse Practitioner

## 2021-07-05 ENCOUNTER — Encounter: Payer: Self-pay | Admitting: Nurse Practitioner

## 2021-07-05 ENCOUNTER — Other Ambulatory Visit: Payer: Self-pay

## 2021-07-05 VITALS — BP 128/78 | HR 69 | Temp 97.8°F | Resp 16 | Ht 66.0 in | Wt 137.8 lb

## 2021-07-05 DIAGNOSIS — F0150 Vascular dementia without behavioral disturbance: Secondary | ICD-10-CM

## 2021-07-05 DIAGNOSIS — M25551 Pain in right hip: Secondary | ICD-10-CM

## 2021-07-05 DIAGNOSIS — K648 Other hemorrhoids: Secondary | ICD-10-CM

## 2021-07-05 DIAGNOSIS — R69 Illness, unspecified: Secondary | ICD-10-CM | POA: Diagnosis not present

## 2021-07-05 MED ORDER — MELOXICAM 7.5 MG PO TABS: 7.5000 mg | ORAL_TABLET | Freq: Every day | ORAL | 2 refills | Status: DC

## 2021-07-05 MED ORDER — PRAMOXINE HCL (PERIANAL) 1 % EX FOAM: 1.0000 "application " | Freq: Three times a day (TID) | CUTANEOUS | 0 refills | Status: DC | PRN

## 2021-07-05 MED ORDER — HYDROCORTISONE 1 % EX CREA: TOPICAL_CREAM | CUTANEOUS | 1 refills | Status: DC

## 2021-07-09 ENCOUNTER — Ambulatory Visit: Payer: Medicare HMO | Admitting: Adult Health

## 2021-07-16 ENCOUNTER — Ambulatory Visit: Payer: Self-pay | Admitting: Adult Health

## 2021-07-24 ENCOUNTER — Encounter: Payer: Self-pay | Admitting: Adult Health

## 2021-07-24 NOTE — Progress Notes (Signed)
3 MONTH FOLLOW UP  Assessment:     Essential hypertension Continue current medications Monitor blood pressure at home; slightly low today, contact office if frequently <110/60 call if consistently over 140/80 Continue DASH diet.   Reminder to go to the ER if any CP, SOB, nausea, dizziness, severe HA, changes vision/speech, left arm numbness and tingling and jaw pain. -     CBC with Differential/Platelet -     COMPLETE METABOLIC PANEL WITH GFR -     TSH  Hyperlipademia, mixed Continue medications: rosuvastatin 20mg  daily Discussed dietary and exercise modifications Low fat diet  Vascular dementia without behavioral disturbance (HCC) Taking namenda and aircept Stable, no concerns Follows with Dr. Jaynee Nielsen  Gastroesophageal reflux disease, esophagitis presence not specified Controlled by lifestyle. Monitor   Hyperlipidemia, mixed -     Lipid panel check lipids decrease fatty foods increase activity.  Abnormal glucose Recent A1Cs at goal Discussed diet/exercise, weight management  Defer A1C; check CMP   Body mass index (BMI) 22 Increase weight Monitor closely DOing well with remeron   Mild malnutrition (HCC) Improved with remeron Monitor closely  Medication management Continued  Vitamin D deficiency Continue supplement  R hip pain Very mild arthritis on recent xray Exam more suggestive of muscular etiology Dicussed heat/ice, stretches, tylenol, topical voltaren Declines NSAIDs or ortho at this time   Over 30 minutes of chart review, face to face exam, counseling, chart review and critical decision making was performed. Future Appointments  Date Time Provider [place]  10/25/2021  1:30 PM Shannon Givens, NP [facility] None  12/04/2021  2:00 PM Shannon Comber, NP [facility] None  04/22/2022 11:00 AM Shannon Pinto, MD [facility] None    Subjective:  Shannon Nielsen is a 76 y.o. female who presents for 3 month follow up. She has Hyperlipidemia,  mixed; Essential hypertension; Vitamin D deficiency; GERD (gastroesophageal reflux disease); Medication management; BMI 20.0-20.9, adult; Open-angle glaucoma; Memory changes; Vascular dementia without behavioral disturbance ([place]); B12 deficiency; Abnormal glucose; Mild malnutrition ([place]); Stage 3a chronic kidney disease ([place]); and History of breast cancer on their problem list.    She is accompanied by her husband, Shannon Nielsen today.  She has history of dementia and follows with Dr Shannon Nielsen for this in the past.  She is taking aricept and namenda.  She is alert and oriented x2.  She is pleasant with conversation. He manages meds and finances, driving. She has sitter while he is at work.   She is having more R hip pain; had XR 07/05/2021 showing Mild left and minimal right femoroacetabular osteoarthritis, they are not interested in persuing ortho evaluation. Recently having more R hip pain, lateral/posterior. Has occasionally tried tylenol but unsure. Declines NSAIDs . Admittedly more sedentary.   She has history of breast cancer (2011) s/p bilateral mastectomy and radiation complete Nov 2011.  She had neuropathy during treatments but has since resolved.  Had glaucoma and cataract surgery at [facility] 09/29/2018 with Dr. Arlis Nielsen. She is doing well and has routine eye exams.    BMI is Body mass index is 22.44 kg/m.  Weight was down to 106 lb (BMI 16.72) in July 2020, has improved with remeron.  Was walking 1/4 mile but admits not recently  Wt Readings from Last 3 Encounters:  07/25/21 139 lb (63 kg)  07/05/21 137 lb 12.8 oz (62.5 kg)  04/25/21 138 lb 6.4 oz (62.8 kg)   Her blood pressure has been controlled at home ("good" but can't give specific numbers), today their BP is  BP: 104/60  She does not workout. She denies chest pain, shortness of breath, dizziness.   She is on cholesterol medication (rosuvastatin 20 mg daily) and denies myalgias. Her cholesterol is at goal. The cholesterol last visit was:   Lab  Results  Component Value Date   CHOL 182 04/20/2021   HDL 74 04/20/2021   LDLCALC 91 04/20/2021   TRIG 80 04/20/2021   CHOLHDL 2.5 04/20/2021   She has a history of preDM but has decreased sugar and has increased water. Last A1C in the office was:  Lab Results  Component Value Date   HGBA1C 5.7 (H) 04/20/2021   She has history of CKD borderline II/III at baseline for many years, denies NSAIDs. Trying to increase fluid intake. Last GFR: Lab Results  Component Value Date   GFRAA 58 (L) 11/30/2020   Patient is on Vitamin D supplement.   Lab Results  Component Value Date   VD25OH 70 04/20/2021      CBC Latest Ref Rng & Units 04/20/2021 11/30/2020 08/22/2020  WBC 3.8 - 10.8 Thousand/uL 3.5(L) 3.4(L) 3.1(L)  Hemoglobin 11.7 - 15.5 g/dL 15.4 14.5 15.5  Hematocrit 35.0 - 45.0 % 45.2(H) 44.5 46.0(H)  Platelets 140 - 400 Thousand/uL 161 176 194   Has dx of def and on daily supplement tablet, unknown dose Lab Results  Component Value Date   [id number] 12/31/2018      Medication Review: Current Outpatient Medications on File Prior to Visit  Medication Sig Dispense Refill   acetaminophen (TYLENOL) 500 MG tablet Take 500 mg by mouth every 6 (six) hours as needed.     aspirin 81 MG tablet Take 81 mg by mouth daily.      atenolol (TENORMIN) 100 MG tablet Take  1 tablet  Daily  for BP 90 tablet 3   Cholecalciferol (VITAMIN D3) 1.25 MG (50000 UT) CAPS TAKE ONE CAPSULE THREE DAYS A WEEK FOR TWELVE WEEKS. 36 capsule 0   donepezil (ARICEPT) 10 MG tablet Take 1 tablet at Bedtime for Memory 90 tablet 3   fluticasone (FLONASE) 50 MCG/ACT nasal spray INSTILL 1 SPRAY INTO EACH NOSTRIL TWICE A DAY 48 mL 3   hydrocortisone cream 1 % Apply to affected area 2 times daily 30 g 1   latanoprost (XALATAN) 0.005 % ophthalmic solution Place 1 drop into both eyes at bedtime.     letrozole (FEMARA) 2.5 MG tablet letrozole 2.5 mg tablet     meloxicam (MOBIC) 7.5 MG tablet Take 1 tablet (7.5 mg total) by  mouth daily. 30 tablet 2   memantine (NAMENDA) 10 MG tablet Take  1 tablet  2 x day  for Memory  / Patient knows to take by mouth 180 tablet 3   mirtazapine (REMERON) 15 MG tablet TAKE 1 TABLET BY MOUTH AT BEDTIME AS NEEDED FOR SLEEP AND  APPETITE 90 tablet 0   olmesartan (BENICAR) 40 MG tablet Take  1 tablet  at Bedtime  for BP 90 tablet 3   OVER THE COUNTER MEDICATION daily. B-Complex with 1000 mcg Biotin     pramoxine (PROCTOFOAM) 1 % foam Place 1 application rectally 3 (three) times daily as needed for anal itching. 15 g 0   rosuvastatin (CRESTOR) 20 MG tablet TAKE 1 TABLET BY MOUTH ONCE DAILY FOR CHOLESTEROL 90 tablet 1   No current facility-administered medications on file prior to visit.    Allergies  Allergen Reactions   Minocycline    Prednisone Swelling   Sulfa Antibiotics  Patient does not remember the type of reaction, happened years ago.   Zithromax [Azithromycin] Swelling   Ciprofloxacin Hcl Rash   Penicillins Rash    Current Problems (verified) Patient Active Problem List   Diagnosis Date Noted   Stage 3a chronic kidney disease ([place]) 11/30/2020   History of breast cancer 11/30/2020   Mild malnutrition ([place]) 10/01/2018   Vascular dementia without behavioral disturbance ([place]) 06/22/2018   B12 deficiency 06/22/2018   Abnormal glucose 06/22/2018   Memory changes 10/27/2017   BMI 20.0-20.9, adult 05/08/2015   Open-angle glaucoma 05/08/2015   Medication management 08/30/2013   Vitamin D deficiency    GERD (gastroesophageal reflux disease)    Hyperlipidemia, mixed 10/18/2010   Essential hypertension 10/18/2010   SURGICAL HISTORY She  has a past surgical history that includes Mastectomy (August 2011); Parathyroidectomy; and Nasal sinus surgery. FAMILY HISTORY Her family history includes Alzheimer's disease in her mother; Cancer in her mother; Dementia in her maternal grandfather and maternal grandmother; Diabetes in her mother; Heart attack in her brother; Heart  attack (age of onset: 29) in her father. SOCIAL HISTORY She  reports that she has never smoked. She has never used smokeless tobacco. She reports that she does not drink alcohol and does not use drugs.   Review of Systems  Constitutional: Negative.  Negative for malaise/fatigue and weight loss.  HENT: Negative.  Negative for hearing loss and tinnitus.   Eyes: Negative.  Negative for blurred vision and double vision.  Respiratory: Negative.  Negative for cough, sputum production, shortness of breath and wheezing.   Cardiovascular: Negative.  Negative for chest pain, palpitations, orthopnea, claudication, leg swelling and PND.  Gastrointestinal: Negative.  Negative for abdominal pain, blood in stool, constipation, diarrhea, heartburn, melena, nausea and vomiting.  Genitourinary: Negative.   Musculoskeletal:  Positive for joint pain (R hip pain). Negative for falls and myalgias.  Skin: Negative.  Negative for rash.  Neurological: Negative.  Negative for dizziness, tingling, sensory change, weakness and headaches.  Endo/Heme/Allergies: Negative.  Negative for polydipsia.  Psychiatric/Behavioral:  Positive for memory loss (stable). Negative for depression, substance abuse and suicidal ideas. The patient is not nervous/anxious and does not have insomnia.   All other systems reviewed and are negative.   Objective:     Today's Vitals   07/25/21 1148  BP: 104/60  Pulse: (!) 59  Temp: 97.7 F (36.5 C)  SpO2: 99%  Weight: 139 lb (63 kg)   Body mass index is 22.44 kg/m.  General appearance: alert, no distress, WD/WN, female HEENT: normocephalic, sclerae anicteric, TMs pearly, nares patent, no discharge or erythema, pharynx normal Oral cavity: MMM, no lesions Neck: supple, no lymphadenopathy, no thyromegaly, no masses Heart: RRR, normal S1, S2, no murmurs Lungs: CTA bilaterally, no wheezes, rhonchi, or rales Abdomen: +bs, soft, non tender, non distended, no masses, no hepatomegaly, no  splenomegaly Musculoskeletal: nontender, no swelling, no obvious deformity. Intact hip ROM, no lateral tenderness, pain is vague but indicated retrotrochanter, no spasm, no bursal tenderness.  Extremities: no edema, no cyanosis, no clubbing Pulses: 2+ symmetric, upper and lower extremities, normal cap refill Neurological: alert, oriented x 2, CN2-12 intact, strength normal upper extremities and lower extremities, sensation normal throughout, DTRs 2+ throughout, no cerebellar signs, gait normal Psychiatric: normal affect, behavior normal, pleasant   Medicare Attestation I have personally reviewed: The patient's medical and social history Their use of alcohol, tobacco or illicit drugs Their current medications and supplements The patient's functional ability including ADLs,fall risks, home safety risks,  cognitive, and hearing and visual impairment Diet and physical activities Evidence for depression or mood disorders  The patient's weight, height, BMI, and visual acuity have been recorded in the chart.  I have made referrals, counseling, and provided education to the patient based on review of the above and I have provided the patient with a written personalized care plan for preventive services.     Izora Ribas, NP   07/25/2021

## 2021-07-25 ENCOUNTER — Ambulatory Visit (INDEPENDENT_AMBULATORY_CARE_PROVIDER_SITE_OTHER): Payer: Medicare HMO | Admitting: Adult Health

## 2021-07-25 ENCOUNTER — Encounter: Payer: Self-pay | Admitting: Adult Health

## 2021-07-25 ENCOUNTER — Other Ambulatory Visit: Payer: Self-pay

## 2021-07-25 VITALS — BP 104/60 | HR 59 | Temp 97.7°F | Wt 139.0 lb

## 2021-07-25 DIAGNOSIS — Z6822 Body mass index (BMI) 22.0-22.9, adult: Secondary | ICD-10-CM

## 2021-07-25 DIAGNOSIS — E782 Mixed hyperlipidemia: Secondary | ICD-10-CM | POA: Diagnosis not present

## 2021-07-25 DIAGNOSIS — E538 Deficiency of other specified B group vitamins: Secondary | ICD-10-CM | POA: Diagnosis not present

## 2021-07-25 DIAGNOSIS — Z79899 Other long term (current) drug therapy: Secondary | ICD-10-CM | POA: Diagnosis not present

## 2021-07-25 DIAGNOSIS — N1831 Chronic kidney disease, stage 3a: Secondary | ICD-10-CM | POA: Diagnosis not present

## 2021-07-25 DIAGNOSIS — E441 Mild protein-calorie malnutrition: Secondary | ICD-10-CM

## 2021-07-25 DIAGNOSIS — E559 Vitamin D deficiency, unspecified: Secondary | ICD-10-CM | POA: Diagnosis not present

## 2021-07-25 DIAGNOSIS — R7309 Other abnormal glucose: Secondary | ICD-10-CM | POA: Diagnosis not present

## 2021-07-25 DIAGNOSIS — Z6820 Body mass index (BMI) 20.0-20.9, adult: Secondary | ICD-10-CM

## 2021-07-25 DIAGNOSIS — F0150 Vascular dementia without behavioral disturbance: Secondary | ICD-10-CM

## 2021-07-25 DIAGNOSIS — M25551 Pain in right hip: Secondary | ICD-10-CM

## 2021-07-25 DIAGNOSIS — R69 Illness, unspecified: Secondary | ICD-10-CM | POA: Diagnosis not present

## 2021-07-25 DIAGNOSIS — I10 Essential (primary) hypertension: Secondary | ICD-10-CM | POA: Diagnosis not present

## 2021-07-25 MED ORDER — DICLOFENAC SODIUM 1 % EX GEL: 2.0000 g | Freq: Four times a day (QID) | CUTANEOUS | Status: DC

## 2021-07-25 NOTE — Patient Instructions (Signed)
Please verify B12 tab dose at home and message me back    Try tylenol [phone] mg three times a day (or 500 mg every 6 hours)  Voltaren (diclofenac gel) 2 g up to 4 times per day  Can try ice and heat   Try gradually increasing gentle walking   Hip Exercises Ask your health care provider which exercises are safe for you. Do exercises exactly as told by your health care provider and adjust them as directed. It is normal to feel mild stretching, pulling, tightness, or discomfort as you do these exercises. Stop right away if you feel sudden pain or your pain gets worse. Do not begin these exercises until told by your health care provider. Stretching and range-of-motion exercises These exercises warm up your muscles and joints and improve the movement and flexibility of your hip. These exercises also help to relieve pain, numbness, and tingling. You may be asked to limit your range of motion if you had a hip replacement. Talk to your health care provider about these restrictions. Hamstrings, supine  Lie on your back (supine position). Loop a belt or towel over the ball of your left / right foot. The ball of your foot is on the walking surface, right under your toes. Straighten your left / right knee and slowly pull on the belt or towel to raise your leg until you feel a gentle stretch behind your knee (hamstring). Do not let your knee bend while you do this. Keep your other leg flat on the floor. Hold this position for __________ seconds. Slowly return your leg to the starting position. Repeat __________ times. Complete this exercise __________ times a day. Hip rotation  Lie on your back on a firm surface. With your left / right hand, gently pull your left / right knee toward the shoulder that is on the same side of the body. Stop when your knee is pointing toward the ceiling. Hold your left / right ankle with your other hand. Keeping your knee steady, gently pull your left / right ankle  toward your other shoulder until you feel a stretch in your buttocks. Keep your hips and shoulders firmly planted while you do this stretch. Hold this position for __________ seconds. Repeat __________ times. Complete this exercise __________ times a day. Seated stretch This exercise is sometimes called hamstrings and adductors stretch. Sit on the floor with your legs stretched wide. Keep your knees straight during this exercise. Keeping your head and back in a straight line, bend at your waist to reach for your left foot (position A). You should feel a stretch in your right inner thigh (adductors). Hold this position for __________ seconds. Then slowly return to the upright position. Keeping your head and back in a straight line, bend at your waist to reach forward (position B). You should feel a stretch behind both of your thighs and knees (hamstrings). Hold this position for __________ seconds. Then slowly return to the upright position. Keeping your head and back in a straight line, bend at your waist to reach for your right foot (position C). You should feel a stretch in your left inner thigh (adductors). Hold this position for __________ seconds. Then slowly return to the upright position. Repeat __________ times. Complete this exercise __________ times a day. Lunge This exercise stretches the muscles of the hip (hip flexors). Place your left / right knee on the floor and bend your other knee so that is directly over your ankle. You should be  half-kneeling. Keep good posture with your head over your shoulders. Tighten your buttocks to point your tailbone downward. This will prevent your back from arching too much. You should feel a gentle stretch in the front of your left / right thigh and hip. If you do not feel a stretch, slide your other foot forward slightly and then slowly lunge forward with your chest up until your knee once again lines up over your ankle. Make sure your tailbone  continues to point downward. Hold this position for __________ seconds. Slowly return to the starting position. Repeat __________ times. Complete this exercise __________ times a day. Strengthening exercises These exercises build strength and endurance in your hip. Endurance is the ability to use your muscles for a long time, even after they get tired. Bridge This exercise strengthens the muscles of your hip (hip extensors). Lie on your back on a firm surface with your knees bent and your feet flat on the floor. Tighten your buttocks muscles and lift your bottom off the floor until the trunk of your body and your hips are level with your thighs. Do not arch your back. You should feel the muscles working in your buttocks and the back of your thighs. If you do not feel these muscles, slide your feet 1-2 inches (2.5-5 cm) farther away from your buttocks. Hold this position for __________ seconds. Slowly lower your hips to the starting position. Let your muscles relax completely between repetitions. Repeat __________ times. Complete this exercise __________ times a day. Straight leg raises, side-lying This exercise strengthens the muscles that move the hip joint away from the center of the body (hip abductors). Lie on your side with your left / right leg in the top position. Lie so your head, shoulder, hip, and knee line up. You may bend your bottom knee slightly to help you balance. Roll your hips slightly forward, so your hips are stacked directly over each other and your left / right knee is facing forward. Leading with your heel, lift your top leg 4-6 inches (10-15 cm). You should feel the muscles in your top hip lifting. Do not let your foot drift forward. Do not let your knee roll toward the ceiling. Hold this position for __________ seconds. Slowly return to the starting position. Let your muscles relax completely between repetitions. Repeat __________ times. Complete this exercise  __________ times a day. Straight leg raises, side-lying This exercise strengthens the muscles that move the hip joint toward the center of the body (hip adductors). Lie on your side with your left / right leg in the bottom position. Lie so your head, shoulder, hip, and knee line up. You may place your upper foot in front to help you balance. Roll your hips slightly forward, so your hips are stacked directly over each other and your left / right knee is facing forward. Tense the muscles in your inner thigh and lift your bottom leg 4-6 inches (10-15 cm). Hold this position for __________ seconds. Slowly return to the starting position. Let your muscles relax completely between repetitions. Repeat __________ times. Complete this exercise __________ times a day. Straight leg raises, supine This exercise strengthens the muscles in the front of your thigh (quadriceps). Lie on your back (supine position) with your left / right leg extended and your other knee bent. Tense the muscles in the front of your left / right thigh. You should see your kneecap slide up or see increased dimpling just above your knee. Keep these muscles tight  as you raise your leg 4-6 inches (10-15 cm) off the floor. Do not let your knee bend. Hold this position for __________ seconds. Keep these muscles tense as you lower your leg. Relax the muscles slowly and completely between repetitions. Repeat __________ times. Complete this exercise __________ times a day. Hip abductors, standing This exercise strengthens the muscles that move the leg and hip joint away from the center of the body (hip abductors). Tie one end of a rubber exercise band or tubing to a secure surface, such as a chair, table, or pole. Loop the other end of the band or tubing around your left / right ankle. Keeping your ankle with the band or tubing directly opposite the secured end, step away until there is tension in the tubing or band. Hold on to a chair,  table, or pole as needed for balance. Lift your left / right leg out to your side. While you do this: Keep your back upright. Keep your shoulders over your hips. Keep your toes pointing forward. Make sure to use your hip muscles to slowly lift your leg. Do not tip your body or forcefully lift your leg. Hold this position for __________ seconds. Slowly return to the starting position. Repeat __________ times. Complete this exercise __________ times a day. Squats This exercise strengthens the muscles in the front of your thigh (quadriceps). Stand in a door frame so your feet and knees are in line with the frame. You may place your hands on the frame for balance. Slowly bend your knees and lower your hips like you are going to sit in a chair. Keep your lower legs in a straight-up-and-down position. Do not let your hips go lower than your knees. Do not bend your knees lower than told by your health care provider. If your hip pain increases, do not bend as low. Hold this position for ___________ seconds. Slowly push with your legs to return to standing. Do not use your hands to pull yourself to standing. Repeat __________ times. Complete this exercise __________ times a day. This information is not intended to replace advice given to you by your health care provider. Make sure you discuss any questions you have with your health care provider. Document Revised: 10/11/2020 Document Reviewed: 10/11/2020 Elsevier Patient Education  [place].

## 2021-07-26 LAB — CBC WITH DIFFERENTIAL/PLATELET
Absolute Monocytes: 284 cells/uL (ref 200–950)
Basophils Absolute: 20 cells/uL (ref 0–200)
Basophils Relative: 0.6 %
Eosinophils Absolute: 20 cells/uL (ref 15–500)
Eosinophils Relative: 0.6 %
HCT: 42.5 % (ref 35.0–45.0)
Hemoglobin: 14.4 g/dL (ref 11.7–15.5)
Lymphs Abs: 1003 cells/uL (ref 850–3900)
MCH: 31.6 pg (ref 27.0–33.0)
MCHC: 33.9 g/dL (ref 32.0–36.0)
MCV: 93.4 fL (ref 80.0–100.0)
MPV: 10.3 fL (ref 7.5–12.5)
Monocytes Relative: 8.6 %
Neutro Abs: 1973 cells/uL (ref 1500–7800)
Neutrophils Relative %: 59.8 %
Platelets: 172 10*3/uL (ref 140–400)
RBC: 4.55 10*6/uL (ref 3.80–5.10)
RDW: 11.9 % (ref 11.0–15.0)
Total Lymphocyte: 30.4 %
WBC: 3.3 10*3/uL — ABNORMAL LOW (ref 3.8–10.8)

## 2021-07-26 LAB — COMPLETE METABOLIC PANEL WITH GFR
AG Ratio: 1.6 (calc) (ref 1.0–2.5)
ALT: 15 U/L (ref 6–29)
AST: 17 U/L (ref 10–35)
Albumin: 4.2 g/dL (ref 3.6–5.1)
Alkaline phosphatase (APISO): 93 U/L (ref 37–153)
BUN/Creatinine Ratio: 17 (calc) (ref 6–22)
BUN: 18 mg/dL (ref 7–25)
CO2: 27 mmol/L (ref 20–32)
Calcium: 10.2 mg/dL (ref 8.6–10.4)
Chloride: 111 mmol/L — ABNORMAL HIGH (ref 98–110)
Creat: 1.06 mg/dL — ABNORMAL HIGH (ref 0.60–1.00)
Globulin: 2.7 g/dL (calc) (ref 1.9–3.7)
Glucose, Bld: 71 mg/dL (ref 65–99)
Potassium: 4.2 mmol/L (ref 3.5–5.3)
Sodium: 144 mmol/L (ref 135–146)
Total Bilirubin: 0.7 mg/dL (ref 0.2–1.2)
Total Protein: 6.9 g/dL (ref 6.1–8.1)
eGFR: 55 mL/min/{1.73_m2} — ABNORMAL LOW (ref >=60)

## 2021-07-26 LAB — LIPID PANEL
Cholesterol: 167 mg/dL (ref <200)
HDL: 64 mg/dL (ref >=50)
LDL Cholesterol (Calc): 86 mg/dL (calc)
Non-HDL Cholesterol (Calc): 103 mg/dL (calc) (ref <130)
Total CHOL/HDL Ratio: 2.6 (calc) (ref <5.0)
Triglycerides: 79 mg/dL (ref <150)

## 2021-07-26 LAB — MAGNESIUM: Magnesium: 2.1 mg/dL (ref 1.5–2.5)

## 2021-07-26 LAB — TSH: TSH: 0.57 mIU/L (ref 0.40–4.50)

## 2021-07-26 LAB — VITAMIN B12: Vitamin B-12: >2000 pg/mL — ABNORMAL HIGH (ref 200–1100)

## 2021-09-25 NOTE — Progress Notes (Signed)
Assessment and Plan: ? ?Shannon Nielsen was seen today for acute visit. ? ?Diagnoses and all orders for this visit: ? ?External hemorrhoid ?Use witch hazel wipes after BM ?Continue to use Proctofoam ?Unable to reduce ?If gets larger or bleeds more often notify the office and will refer to GI. ?  ?Vascular dementia without behavioral disturbances(HCC) ?Continue Namenda and Aricept ?Monitor for changes in behavior ?Continue to follow with neurology ? ? ?Further disposition pending results of labs. Discussed med's effects and SE's.   ?Over 30 minutes of exam, counseling, chart review, and critical decision making was performed.  ? ?Future Appointments  ?Date Time Provider [place]  ?10/25/2021  1:30 PM Ward Givens, NP [facility] None  ?12/04/2021  2:00 PM Liane Comber, NP [facility] None  ?04/22/2022 11:00 AM Unk Pinto, MD [facility] None  ? ? ?------------------------------------------------------------------------------------------------------------------ ? ? ?HPI ?BP 138/78   Pulse (!) 50   Temp (!) 97.5 ?F (36.4 ?C)   Wt 138 lb 3.2 oz (62.7 kg)   SpO2 99%   BMI 22.31 kg/m?  ? ?76 y.o.female presents for hemorrhoids that her husband noted patient was seen  trying to push the hemorrhoids back in. The caregiver is with her today and states she has noticed some blood.  Patient herself denies issues with hemorrhoids but does have dementia. ? ? ?Past Medical History:  ?Diagnosis Date  ? Breast cancer ([place])   ? Breast cancer of upper-outer quadrant of left female breast ([place]) 07/08/2011  ? Chest tightness   ? GERD (gastroesophageal reflux disease)   ? Hepatitis C virus screening Negative 08/31/2012 03/08/2018  ? Hyperlipidemia   ? Hyperparathyroidism, primary ([place]) 08/12/2012  ? Hypertension   ? Iron deficiency 11/30/2016  ? Palpitations   ? Prediabetes   ? Vitamin D deficiency   ?  ? ?Allergies  ?Allergen Reactions  ? Minocycline   ? Prednisone Swelling  ? Sulfa Antibiotics   ?  Patient does not remember the  type of reaction, happened years ago.  ? Zithromax [Azithromycin] Swelling  ? Ciprofloxacin Hcl Rash  ? Penicillins Rash  ? ? ?Current Outpatient Medications on File Prior to Visit  ?Medication Sig  ? acetaminophen (TYLENOL) 500 MG tablet Take 500 mg by mouth every 6 (six) hours as needed.  ? aspirin 81 MG tablet Take 81 mg by mouth daily.   ? atenolol (TENORMIN) 100 MG tablet Take  1 tablet  Daily  for BP  ? Cholecalciferol (VITAMIN D3) 1.25 MG (50000 UT) CAPS TAKE ONE CAPSULE THREE DAYS A WEEK FOR TWELVE WEEKS.  ? diclofenac Sodium (VOLTAREN) 1 % GEL Apply 2 g topically 4 (four) times daily.  ? donepezil (ARICEPT) 10 MG tablet Take 1 tablet at Bedtime for Memory  ? fluticasone (FLONASE) 50 MCG/ACT nasal spray INSTILL 1 SPRAY INTO EACH NOSTRIL TWICE A DAY  ? hydrocortisone cream 1 % Apply to affected area 2 times daily  ? meloxicam (MOBIC) 7.5 MG tablet Take 1 tablet (7.5 mg total) by mouth daily.  ? memantine (NAMENDA) 10 MG tablet Take  1 tablet  2 x day  for Memory  / Patient knows to take by mouth  ? OVER THE COUNTER MEDICATION daily. B-Complex with 1000 mcg Biotin  ? pramoxine (PROCTOFOAM) 1 % foam Place 1 application rectally 3 (three) times daily as needed for anal itching.  ? rosuvastatin (CRESTOR) 20 MG tablet TAKE 1 TABLET BY MOUTH ONCE DAILY FOR CHOLESTEROL  ? latanoprost (XALATAN) 0.005 % ophthalmic solution Place 1 drop into both  eyes at bedtime.  ? letrozole (FEMARA) 2.5 MG tablet letrozole 2.5 mg tablet  ? mirtazapine (REMERON) 15 MG tablet TAKE 1 TABLET BY MOUTH AT BEDTIME AS NEEDED FOR SLEEP AND  APPETITE  ? olmesartan (BENICAR) 40 MG tablet Take  1 tablet  at Bedtime  for BP  ? ?No current facility-administered medications on file prior to visit.  ? ? ?ROS: all negative except above.  ? ?Physical Exam: ? ?BP 138/78   Pulse (!) 50   Temp (!) 97.5 ?F (36.4 ?C)   Wt 138 lb 3.2 oz (62.7 kg)   SpO2 99%   BMI 22.31 kg/m?  ? ?General Appearance: Well nourished, in no apparent distress. ?Eyes:  PERRLA, EOMs, conjunctiva no swelling or erythema ?Sinuses: No Frontal/maxillary tenderness ?ENT/Mouth: Ext aud canals clear, TMs without erythema, bulging. No erythema, swelling, or exudate on post pharynx.  Tonsils not swollen or erythematous. Hearing normal.  ?Neck: Supple, thyroid normal.  ?Respiratory: Respiratory effort normal, BS equal bilaterally without rales, rhonchi, wheezing or stridor.  ?Cardio: RRR with no MRGs. Brisk peripheral pulses without edema.  ?Abdomen: Soft, + BS.  Non tender, no guarding, rebound, hernias, masses. ?Lymphatics: Non tender without lymphadenopathy.  ?Musculoskeletal: Full ROM, 5/5 strength, normal gait.  ?Skin: Warm, dry without rashes, lesions, ecchymosis.  ?Neuro: Cranial nerves intact. Normal muscle tone, no cerebellar symptoms. Sensation intact.  ?Psych: Very pleasant, normal affect, oriented to place and person. ? Rectal: 4 cm hemorrhoid that appears external, not reducible with attempt to reduce. No bleeding noted on exam today. ? ?Magda Bernheim, NP ?10:46 AM ?[facility] ? ?

## 2021-09-26 ENCOUNTER — Encounter: Payer: Self-pay | Admitting: Nurse Practitioner

## 2021-09-26 ENCOUNTER — Ambulatory Visit (INDEPENDENT_AMBULATORY_CARE_PROVIDER_SITE_OTHER): Payer: Medicare HMO | Admitting: Nurse Practitioner

## 2021-09-26 VITALS — BP 138/78 | HR 50 | Temp 97.5°F | Wt 138.2 lb

## 2021-09-26 DIAGNOSIS — K644 Residual hemorrhoidal skin tags: Secondary | ICD-10-CM | POA: Diagnosis not present

## 2021-09-26 DIAGNOSIS — F0150 Vascular dementia without behavioral disturbance: Secondary | ICD-10-CM | POA: Diagnosis not present

## 2021-09-26 DIAGNOSIS — R69 Illness, unspecified: Secondary | ICD-10-CM | POA: Diagnosis not present

## 2021-09-26 NOTE — Patient Instructions (Addendum)
Keep area clean and dry.  Use witch hazel wipes to wipe rectum.  Continue Proctofoam ?If worsen will refer to surgery- currently not severe and chance of them surgically removing are low ? ?Hemorrhoids ?Hemorrhoids are swollen veins in and around the rectum or anus. There are two types of hemorrhoids: ?Internal hemorrhoids. These occur in the veins that are just inside the rectum. They may poke through to the outside and become irritated and painful. ?External hemorrhoids. These occur in the veins that are outside the anus and can be felt as a painful swelling or hard lump near the anus. ?Most hemorrhoids do not cause serious problems, and they can be managed with home treatments such as diet and lifestyle changes. If home treatments do not help the symptoms, procedures can be done to shrink or remove the hemorrhoids. ?What are the causes? ?This condition is caused by increased pressure in the anal area. This pressure may result from various things, including: ?Constipation. ?Straining to have a bowel movement. ?Diarrhea. ?Pregnancy. ?Obesity. ?Sitting for long periods of time. ?Heavy lifting or other activity that causes you to strain. ?Anal sex. ?Riding a bike for a long period of time. ?What are the signs or symptoms? ?Symptoms of this condition include: ?Pain. ?Anal itching or irritation. ?Rectal bleeding. ?Leakage of stool (feces). ?Anal swelling. ?One or more lumps around the anus. ?How is this diagnosed? ?This condition can often be diagnosed through a visual exam. Other exams or tests may also be done, such as: ?An exam that involves feeling the rectal area with a gloved hand (digital rectal exam). ?An exam of the anal canal that is done using a small tube (anoscope). ?A blood test, if you have lost a significant amount of blood. ?A test to look inside the colon using a flexible tube with a camera on the end (sigmoidoscopy or colonoscopy). ?How is this treated? ?This condition can usually be treated at home.  However, various procedures may be done if dietary changes, lifestyle changes, and other home treatments do not help your symptoms. These procedures can help make the hemorrhoids smaller or remove them completely. Some of these procedures involve surgery, and others do not. Common procedures include: ?Rubber band ligation. Rubber bands are placed at the base of the hemorrhoids to cut off their blood supply. ?Sclerotherapy. Medicine is injected into the hemorrhoids to shrink them. ?Infrared coagulation. A type of light energy is used to get rid of the hemorrhoids. ?Hemorrhoidectomy surgery. The hemorrhoids are surgically removed, and the veins that supply them are tied off. ?Stapled hemorrhoidopexy surgery. The surgeon staples the base of the hemorrhoid to the rectal wall. ?Follow these instructions at home: ?Eating and drinking ? ?Eat foods that have a lot of fiber in them, such as whole grains, beans, nuts, fruits, and vegetables. ?Ask your health care provider about taking products that have added fiber (fiber supplements). ?Reduce the amount of fat in your diet. You can do this by eating low-fat dairy products, eating less red meat, and avoiding processed foods. ?Drink enough fluid to keep your urine pale yellow. ?Managing pain and swelling ? ?Take warm sitz baths for 20 minutes, 3-4 times a day to ease pain and discomfort. You may do this in a bathtub or using a portable sitz bath that fits over the toilet. ?If directed, apply ice to the affected area. Using ice packs between sitz baths may be helpful. ?Put ice in a plastic bag. ?Place a towel between your skin and the bag. ?Leave the  ice on for 20 minutes, 2-3 times a day. ?General instructions ?Take over-the-counter and prescription medicines only as told by your health care provider. ?Use medicated creams or suppositories as told. ?Get regular exercise. Ask your health care provider how much and what kind of exercise is best for you. In general, you should do  moderate exercise for at least 30 minutes on most days of the week (150 minutes each week). This can include activities such as walking, biking, or yoga. ?Go to the bathroom when you have the urge to have a bowel movement. Do not wait. ?Avoid straining to have bowel movements. ?Keep the anal area dry and clean. Use wet toilet paper or moist towelettes after a bowel movement. ?Do not sit on the toilet for long periods of time. This increases blood pooling and pain. ?Keep all follow-up visits as told by your health care provider. This is important. ?Contact a health care provider if you have: ?Increasing pain and swelling that are not controlled by treatment or medicine. ?Difficulty having a bowel movement, or you are unable to have a bowel movement. ?Pain or inflammation outside the area of the hemorrhoids. ?Get help right away if you have: ?Uncontrolled bleeding from your rectum. ?Summary ?Hemorrhoids are swollen veins in and around the rectum or anus. ?Most hemorrhoids can be managed with home treatments such as diet and lifestyle changes. ?Taking warm sitz baths can help ease pain and discomfort. ?In severe cases, procedures or surgery can be done to shrink or remove the hemorrhoids. ?This information is not intended to replace advice given to you by your health care provider. Make sure you discuss any questions you have with your health care provider. ?Document Revised: 12/06/2020 Document Reviewed: 12/06/2020 ?Elsevier Patient Education ? [place]. ? ?[employer]	[place] Wipes ?What is this medication? ?WITCH HAZEL [facility] hey zuhl) relieves minor pain and irritation in the rectal area. It works by decreasing inflammation. It belongs to a group of medications called astringents. ?This medicine may be used for other purposes; ask your health care provider or pharmacist if you have questions. ?COMMON BRAND NAME(S): Hemorrhoidal, Medi-Pads, Preparation H for Women Medicated Hemorrhoidal, Preparation H Maximum  Strength, Preparation H Totables, Sani-Pads with Aloe, Tucks, Tucks Take Along ?What should I tell my care team before I take this medication? ?They need to know if you have any of these conditions: ?Bleeding in the treated area ?An unusual or allergic reaction to witch hazel, other medications, foods, dyes, or preservatives ?Pregnant or trying to get pregnant ?Breast-feeding ?How should I use this medication? ?This medication is for external use only. Follow the directions on the package label or the advice of your care team. Do not use your medication more often than directed. ?Talk to your care team about the use of this medication in children. While this medication may be used for children as young as 12 years for selected conditions, precautions do apply. ?Overdosage: If you think you have taken too much of this medicine contact a poison control center or emergency room at once. ?NOTE: This medicine is only for you. Do not share this medicine with others. ?What if I miss a dose? ?This does not apply; the pads or wipes may be used as needed, up to 6 times per day. ?What may interact with this medication? ?Interactions are not expected. Do not use any other skin products on the same area of skin without asking your care team. ?This list may not describe all possible interactions. Give your  health care provider a list of all the medicines, herbs, non-prescription drugs, or dietary supplements you use. Also tell them if you smoke, drink alcohol, or use illegal drugs. Some items may interact with your medicine. ?What should I watch for while using this medication? ?Tell your care team if your symptoms do not start to get better or if they get worse. ?Many pads and wipes are safe for septic and sewer system disposal. Check specific product label. ?What side effects may I notice from receiving this medication? ?Side effects that you should report to your care team as soon as possible: ?Allergic reactions--skin rash,  itching, hives, swelling of the face, lips, tongue, or throatSide effects that usually do not require medical attention (report to your care team if they continue or are bothersome): ?Mild skin irritation,

## 2021-10-16 ENCOUNTER — Other Ambulatory Visit: Payer: Self-pay | Admitting: Nurse Practitioner

## 2021-10-16 DIAGNOSIS — Z833 Family history of diabetes mellitus: Secondary | ICD-10-CM | POA: Diagnosis not present

## 2021-10-16 DIAGNOSIS — R634 Abnormal weight loss: Secondary | ICD-10-CM

## 2021-10-16 DIAGNOSIS — G309 Alzheimer's disease, unspecified: Secondary | ICD-10-CM | POA: Diagnosis not present

## 2021-10-16 DIAGNOSIS — Z791 Long term (current) use of non-steroidal anti-inflammatories (NSAID): Secondary | ICD-10-CM | POA: Diagnosis not present

## 2021-10-16 DIAGNOSIS — R32 Unspecified urinary incontinence: Secondary | ICD-10-CM | POA: Diagnosis not present

## 2021-10-16 DIAGNOSIS — E785 Hyperlipidemia, unspecified: Secondary | ICD-10-CM | POA: Diagnosis not present

## 2021-10-16 DIAGNOSIS — R69 Illness, unspecified: Secondary | ICD-10-CM | POA: Diagnosis not present

## 2021-10-16 DIAGNOSIS — Z8249 Family history of ischemic heart disease and other diseases of the circulatory system: Secondary | ICD-10-CM | POA: Diagnosis not present

## 2021-10-16 DIAGNOSIS — I10 Essential (primary) hypertension: Secondary | ICD-10-CM | POA: Diagnosis not present

## 2021-10-16 DIAGNOSIS — Z7982 Long term (current) use of aspirin: Secondary | ICD-10-CM | POA: Diagnosis not present

## 2021-10-23 ENCOUNTER — Other Ambulatory Visit: Payer: Self-pay | Admitting: Nurse Practitioner

## 2021-10-23 DIAGNOSIS — M25551 Pain in right hip: Secondary | ICD-10-CM

## 2021-10-25 ENCOUNTER — Ambulatory Visit: Payer: Medicare HMO | Admitting: Adult Health

## 2021-10-25 ENCOUNTER — Encounter: Payer: Self-pay | Admitting: Adult Health

## 2021-10-25 VITALS — BP 133/72 | HR 47 | Ht 66.0 in | Wt 138.8 lb

## 2021-10-25 DIAGNOSIS — G309 Alzheimer's disease, unspecified: Secondary | ICD-10-CM | POA: Diagnosis not present

## 2021-10-25 DIAGNOSIS — R69 Illness, unspecified: Secondary | ICD-10-CM | POA: Diagnosis not present

## 2021-10-25 DIAGNOSIS — F0280 Dementia in other diseases classified elsewhere without behavioral disturbance: Secondary | ICD-10-CM

## 2021-10-25 NOTE — Progress Notes (Signed)
PATIENT: Shannon Nielsen DOB: [birth year]  REASON FOR VISIT: follow up HISTORY FROM: patient  Chief Complaint  Patient presents with   Follow-up    Pt in 46 with daughter  daughter states that patients memory has gotten worse . Pt states no changes       HISTORY OF PRESENT ILLNESS: Today 10/25/21:  Shannon Nielsen is a 76 year old female with a history of memory disturbance.  She returns today for follow-up.  She remains on Aricept and Namenda. Lives with her husband. Has caregiver with her doing the day. Able to do all ADLs independently. Husband helps with medications, appts. And finances. Sleeping well. No changes in mood or behavior.   04/25/21: Shannon Nielsen is a 76 year old female with a history of memory disturbance.  She returns today for follow-up.  The patient has not been seen since 2020.  Her husband feels that her memory has declined over the past 2 years.  She still lives at home with him.  She is able to complete most ADLs independently.  He does have to remind her to eat.  The patient's husband manages her medications, appointments and all the finances.  No change in mood or behavior.  The patient has continued on Aricept and Namenda.  Returns today for an evaluation.  HISTORY 02/23/19: Shannon Nielsen is a 76 year old female with a history of memory disturbance.  She returns today for follow-up.  She feels that overall her memory has remained stable.  She remains on Aricept and Namenda.  She lives at home with her husband.  She is able to complete all ADLs independently.  She no longer operates a motor vehicle.  She does some cooking but her husband tries to supervise.  She manages her medications.  Her husband manages the finances.  She denies any changes in her mood or behavior.  Has report that he still walks.  He states that they are looking to have someone stay with her when he is at work.  She returns today for evaluation.  REVIEW OF SYSTEMS: Out of a complete 14 system review of  symptoms, the patient complains only of the following symptoms, and all other reviewed systems are negative.  See HPI  ALLERGIES: Allergies  Allergen Reactions   Minocycline    Prednisone Swelling   Sulfa Antibiotics     Patient does not remember the type of reaction, happened years ago.   Zithromax [Azithromycin] Swelling   Ciprofloxacin Hcl Rash   Penicillins Rash    HOME MEDICATIONS: Outpatient Medications Prior to Visit  Medication Sig Dispense Refill   acetaminophen (TYLENOL) 500 MG tablet Take 500 mg by mouth every 6 (six) hours as needed.     aspirin 81 MG tablet Take 81 mg by mouth daily.      atenolol (TENORMIN) 100 MG tablet Take  1 tablet  Daily  for BP 90 tablet 3   Cholecalciferol (VITAMIN D3) 1.25 MG (50000 UT) CAPS TAKE ONE CAPSULE THREE DAYS A WEEK FOR TWELVE WEEKS. (Patient taking differently: 2,000 Units daily.) 36 capsule 0   donepezil (ARICEPT) 10 MG tablet Take 1 tablet at Bedtime for Memory 90 tablet 3   fluticasone (FLONASE) 50 MCG/ACT nasal spray INSTILL 1 SPRAY INTO EACH NOSTRIL TWICE A DAY 48 mL 3   hydrocortisone cream 1 % Apply to affected area 2 times daily 30 g 1   latanoprost (XALATAN) 0.005 % ophthalmic solution Place 1 drop into both eyes at bedtime.     letrozole (  FEMARA) 2.5 MG tablet letrozole 2.5 mg tablet     meloxicam (MOBIC) 7.5 MG tablet Take 1 tablet by mouth once daily 30 tablet 0   memantine (NAMENDA) 10 MG tablet Take  1 tablet  2 x day  for Memory  / Patient knows to take by mouth 180 tablet 3   mirtazapine (REMERON) 15 MG tablet TAKE 1 TABLET BY MOUTH AT BEDTIME AS NEEDED FOR SLEEP AND  APPETITE 90 tablet 0   olmesartan (BENICAR) 40 MG tablet Take  1 tablet  at Bedtime  for BP 90 tablet 3   OVER THE COUNTER MEDICATION daily. B-Complex with 1000 mcg Biotin     pramoxine (PROCTOFOAM) 1 % foam Place 1 application rectally 3 (three) times daily as needed for anal itching. 15 g 0   rosuvastatin (CRESTOR) 20 MG tablet TAKE 1 TABLET BY MOUTH  ONCE DAILY FOR CHOLESTEROL 90 tablet 1   diclofenac Sodium (VOLTAREN) 1 % GEL Apply 2 g topically 4 (four) times daily.     No facility-administered medications prior to visit.    PAST MEDICAL HISTORY: Past Medical History:  Diagnosis Date   Breast cancer ([place])    Breast cancer of upper-outer quadrant of left female breast ([place]) 07/08/2011   Chest tightness    GERD (gastroesophageal reflux disease)    Hepatitis C virus screening Negative 08/31/2012 03/08/2018   Hyperlipidemia    Hyperparathyroidism, primary ([place]) 08/12/2012   Hypertension    Iron deficiency 11/30/2016   Palpitations    Prediabetes    Vitamin D deficiency     PAST SURGICAL HISTORY: Past Surgical History:  Procedure Laterality Date   MASTECTOMY  August 2011   Bilateral   NASAL SINUS SURGERY     PARATHYROIDECTOMY      FAMILY HISTORY: Family History  Problem Relation Age of Onset   Diabetes Mother    Cancer Mother        breast   Alzheimer's disease Mother    Heart attack Father 39   Heart attack Brother    Dementia Maternal Grandmother    Dementia Maternal Grandfather     SOCIAL HISTORY: Social History   Socioeconomic History   Marital status: Married    Spouse name: Not on file   Number of children: 1   Years of education: Not on file   Highest education level: Bachelor's degree (e.g., BA, AB, BS)  Occupational History   Not on file  Tobacco Use   Smoking status: Never   Smokeless tobacco: Never  Vaping Use   Vaping Use: Never used  Substance and Sexual Activity   Alcohol use: No   Drug use: Never   Sexual activity: Not on file  Other Topics Concern   Not on file  Social History Narrative   Lives at home with her husband   Right handed   No caffeine   Social Determinants of Health   Financial Resource Strain: Not on file  Food Insecurity: Not on file  Transportation Needs: Not on file  Physical Activity: Not on file  Stress: Not on file  Social Connections: Not on file  Intimate  Partner Violence: Not on file      PHYSICAL EXAM  Vitals:   10/25/21 1321  BP: 133/72  Pulse: (!) 47  Weight: 138 lb 12.8 oz (63 kg)  Height: '5\' 6"'$  (1.676 m)   Body mass index is 22.4 kg/m.     10/25/2021    1:23 PM 04/25/2021    1:34  PM 10/19/2019   10:23 AM  MMSE - Mini Mental State Exam  Orientation to time '1 2 4  '$ Orientation to Place '3 4 5  '$ Registration '3 3 3  '$ Attention/ Calculation 0 0 5  Recall 3 0 3  Language- name 2 objects '1 2 2  '$ Language- repeat '1 1 1  '$ Language- follow 3 step command '3 3 3  '$ Language- read & follow direction 0 1 1  Write a sentence 0 0 1  Copy design 0 1 1  Total score '15 17 29     '$ Generalized: Well developed, in no acute distress   Neurological examination  Mentation: Alert. Follows all commands speech and language fluent Cranial nerve II-XII: Pupils were equal round reactive to light. Extraocular movements were full, visual field were full on confrontational test.  Head turning and shoulder shrug  were normal and symmetric. Motor: The motor testing reveals 5 over 5 strength of all 4 extremities. Good symmetric motor tone is noted throughout.  Sensory: Sensory testing is intact to soft touch on all 4 extremities. No evidence of extinction is noted.  Coordination: Cerebellar testing reveals good finger-nose-finger and heel-to-shin bilaterally.  Some difficulty following directions with finger-nose-finger Gait and station: Gait is normal.  Reflexes: Deep tendon reflexes are symmetric and normal bilaterally.   DIAGNOSTIC DATA (LABS, IMAGING, TESTING) - I reviewed patient records, labs, notes, testing and imaging myself where available.  Lab Results  Component Value Date   WBC 3.3 (L) 07/25/2021   HGB 14.4 07/25/2021   HCT 42.5 07/25/2021   MCV 93.4 07/25/2021   PLT 172 07/25/2021      Component Value Date/Time   NA 144 07/25/2021 1236   NA 140 01/25/2016 1522   K 4.2 07/25/2021 1236   K 4.2 01/25/2016 1522   CL 111 (H) 07/25/2021  1236   CL 104 07/28/2012 1437   CO2 27 07/25/2021 1236   CO2 28 01/25/2016 1522   GLUCOSE 71 07/25/2021 1236   GLUCOSE 96 01/25/2016 1522   GLUCOSE 103 (H) 07/28/2012 1437   BUN 18 07/25/2021 1236   BUN 14.9 01/25/2016 1522   CREATININE 1.06 (H) 07/25/2021 1236   CREATININE 1.0 01/25/2016 1522   CALCIUM 10.2 07/25/2021 1236   CALCIUM 10.2 01/25/2016 1522   CALCIUM 10.5 (H) 01/25/2016 1522   PROT 6.9 07/25/2021 1236   PROT 7.2 01/25/2016 1522   ALBUMIN 4.6 11/18/2016 1044   ALBUMIN 4.0 01/25/2016 1522   AST 17 07/25/2021 1236   AST 23 01/25/2016 1522   ALT 15 07/25/2021 1236   ALT 16 01/25/2016 1522   ALKPHOS 95 11/18/2016 1044   ALKPHOS 108 01/25/2016 1522   BILITOT 0.7 07/25/2021 1236   BILITOT 0.88 01/25/2016 1522   GFRNONAA 50 (L) 11/30/2020 1515   GFRAA 58 (L) 11/30/2020 1515   Lab Results  Component Value Date   CHOL 167 07/25/2021   HDL 64 07/25/2021   LDLCALC 86 07/25/2021   TRIG 79 07/25/2021   CHOLHDL 2.6 07/25/2021   Lab Results  Component Value Date   HGBA1C 5.7 (H) 04/20/2021   Lab Results  Component Value Date   VITAMINB12 >2,000 (H) 07/25/2021   Lab Results  Component Value Date   TSH 0.57 07/25/2021      ASSESSMENT AND PLAN 76 y.o. year old female  has a past medical history of Breast cancer ([place]), Breast cancer of upper-outer quadrant of left female breast ([place]) (07/08/2011), Chest tightness, GERD (gastroesophageal reflux disease), Hepatitis C virus  screening Negative 08/31/2012 (03/08/2018), Hyperlipidemia, Hyperparathyroidism, primary ([place]) (08/12/2012), Hypertension, Iron deficiency (11/30/2016), Palpitations, Prediabetes, and Vitamin D deficiency. here with:  1.  Memory disturbance  MMSE 15 out of 30 previously 17 out of 30 Continue Aricept 10 mg at bedtime Continue Namenda 10 mg twice a day Patient will have regular FU with PCP. Follow-up with our office as needed. Future refills will come from PCP   Ward Givens, MSN, NP-C 10/25/2021,  1:24 PM [facility] [address], [place], [place] [address] [phone]

## 2021-10-25 NOTE — Patient Instructions (Signed)
Your Plan:  Continue aricept and namenda  If your symptoms worsen or you develop new symptoms please let us know.    Thank you for coming to see [place] at [facility]. I hope we have been able to provide you high quality care today.  You may receive a patient satisfaction survey over the next few weeks. We would appreciate your feedback and comments so that we may continue to improve ourselves and the health of our patients.

## 2021-11-26 ENCOUNTER — Other Ambulatory Visit: Payer: Self-pay | Admitting: Nurse Practitioner

## 2021-11-26 DIAGNOSIS — M25551 Pain in right hip: Secondary | ICD-10-CM

## 2021-12-03 ENCOUNTER — Ambulatory Visit: Payer: Medicare HMO | Admitting: Adult Health

## 2021-12-04 ENCOUNTER — Ambulatory Visit: Payer: Medicare HMO | Admitting: Adult Health

## 2021-12-04 DIAGNOSIS — I10 Essential (primary) hypertension: Secondary | ICD-10-CM

## 2021-12-04 DIAGNOSIS — Z8639 Personal history of other endocrine, nutritional and metabolic disease: Secondary | ICD-10-CM | POA: Insufficient documentation

## 2021-12-04 NOTE — Progress Notes (Deleted)
MEDICARE ANNUAL WELLNESS VISIT AND 3 MONTH FOLLOW UP  Assessment:    Annual Medicare Wellness Visit Due annually  Health maintenance reviewed *** Send covid 19 information  Essential hypertension Continue current medications: Atenolol and quinapril *** Monitor blood pressure at home; call if consistently over 130/80 Continue DASH diet.   Reminder to go to the ER if any CP, SOB, nausea, dizziness, severe HA, changes vision/speech, left arm numbness and tingling and jaw pain. -     CBC with Differential/Platelet -     COMPLETE METABOLIC PANEL WITH GFR -     TSH  Hyperlipademia, mixed Continue medications: rosuvastatin 53m daily Discussed dietary and exercise modifications Low fat diet  Vascular dementia without behavioral disturbance (HCC) Taking namenda and aircept Stable, no concerns Follows with Dr. AJaynee Eagles Gastroesophageal reflux disease, esophagitis presence not specified Continue PPI/H2 blocker, diet discussed  History of left breast cancer *** S/p mastectomy  Open-angle glaucoma of right eye, unspecified glaucoma stage, unspecified open-angle glaucoma type S/p surgery doing well  Mild malnutrition ([place] Improved with remeron Monitor closely  Hyperlipidemia, mixed -     Lipid panel check lipids decrease fatty foods increase activity.  Abnormal glucose Recent A1Cs at goal Discussed diet/exercise, weight management  Defer A1C; check CMP   Body mass index (BMI) 21 *** Increase weight Monitor closely DOing well with remeron   Medication management Continued  Vitamin D deficiency Continue supplement  History of primary hyperparathyroidism S/p parathyroidectomy remotely - monitor calcium levels, have been normal    Over 40 minutes of chart review, face to face exam, counseling, chart review and critical decision making was performed. Future Appointments  Date Time Provider DLos Molinos 12/04/2021  2:00 PM CLiane Comber NP [facility]  None  04/22/2022 11:00 AM MUnk Pinto MD [facility] None  11/05/2022  2:00 PM WAlycia Rossetti NP [facility] None     Plan:   During the course of the visit the patient was educated and counseled about appropriate screening and preventive services including:   Pneumococcal vaccine  Prevnar 13 Influenza vaccine Td vaccine Screening electrocardiogram Bone densitometry screening Colorectal cancer screening Diabetes screening Glaucoma screening Nutrition counseling  Advanced directives: requested   Subjective:  Shannon BRIONESis a 76y.o. female who presents for Medicare Annual Wellness Visit and 3 month follow up. She has Hyperlipidemia, mixed; Essential hypertension; Vitamin D deficiency; GERD (gastroesophageal reflux disease); Medication management; BMI 22.0-22.9, adult; Open-angle glaucoma; Memory changes; Vascular dementia without behavioral disturbance ([place]; B12 deficiency; Abnormal glucose; Mild malnutrition ([place]; Stage 3a chronic kidney disease ([place]; History of breast cancer; and History of primary hyperparathyroidism on their problem list.    She is accompanied by her husband, [name] today.  She has history of dementia and follows with Dr AJaynee Eaglesfor this in the past.  She is taking aricept and namenda.  She is alert and oriented x2.  She is pleasant with conversation. He manages meds and finances, driving. She has sitter while he is at work. Denies concerns today.   She has history of breast cancer (2011) s/p bilateral mastectomy and radiation complete Nov 2011.  She had neuropathy during treatments but has since resolved.  [facility] H[facility]lis Porta She is doing well and has routine eye exams.    ***  She is having more R hip pain; had XR 07/05/2021 showing Mild left and minimal right femoroacetabular osteoarthritis, they are not interested in persuing ortho evaluation. Recently having more R  hip pain, lateral/posterior. Has  occasionally tried tylenol but unsure. Declines NSAIDs . Admittedly more sedentary.   BMI is There is no height or weight on file to calculate BMI.  She has been walking 1/4 mile daily with husband. Appetite improved recently.  Wt Readings from Last 3 Encounters:  10/25/21 138 lb 12.8 oz (63 kg)  09/26/21 138 lb 3.2 oz (62.7 kg)  07/25/21 139 lb (63 kg)   Her blood pressure has been controlled at home, today their BP is    She does workout , gentle walking twice a week. She denies chest pain, shortness of breath, dizziness.   She is on cholesterol medication (rosuvastatin 20 mg daily) and denies myalgias. Her cholesterol is at goal. The cholesterol last visit was:   Lab Results  Component Value Date   CHOL 167 07/25/2021   HDL 64 07/25/2021   LDLCALC 86 07/25/2021   TRIG 79 07/25/2021   CHOLHDL 2.6 07/25/2021   She has a history of preDM but has decreased sugar and has increased water. Last A1C in the office was:  Lab Results  Component Value Date   HGBA1C 5.7 (H) 04/20/2021   She has history of CKD borderline II/III for many years, now upper stage III, denies NSAIDs. Trying to increase fluid intake. Last GFR: Lab Results  Component Value Date   EGFR 55 (L) 07/25/2021   EGFR 52 (L) 04/20/2021   EGFR 64 (L) 01/25/2016   Patient is on Vitamin D supplement.   Lab Results  Component Value Date   VD25OH 8 04/20/2021     She has hx of primary hyperparathyroidism, s/p parathyroidectomy remotely. Her calcium/PTH have been in normal range since -  Lab Results  Component Value Date   PTH 61 12/31/2018   CALCIUM 10.2 07/25/2021     Medication Review: Current Outpatient Medications on File Prior to Visit  Medication Sig Dispense Refill   acetaminophen (TYLENOL) 500 MG tablet Take 500 mg by mouth every 6 (six) hours as needed.     aspirin 81 MG tablet Take 81 mg by mouth daily.      atenolol (TENORMIN) 100 MG tablet Take  1 tablet  Daily  for BP 90 tablet 3   Cholecalciferol  (VITAMIN D3) 1.25 MG (50000 UT) CAPS TAKE ONE CAPSULE THREE DAYS A WEEK FOR TWELVE WEEKS. (Patient taking differently: 2,000 Units daily.) 36 capsule 0   diclofenac Sodium (VOLTAREN) 1 % GEL Apply 2 g topically 4 (four) times daily.     donepezil (ARICEPT) 10 MG tablet Take 1 tablet at Bedtime for Memory 90 tablet 3   fluticasone (FLONASE) 50 MCG/ACT nasal spray INSTILL 1 SPRAY INTO EACH NOSTRIL TWICE A DAY 48 mL 3   hydrocortisone cream 1 % Apply to affected area 2 times daily 30 g 1   latanoprost (XALATAN) 0.005 % ophthalmic solution Place 1 drop into both eyes at bedtime.     letrozole (FEMARA) 2.5 MG tablet letrozole 2.5 mg tablet     meloxicam (MOBIC) 7.5 MG tablet Take 1 tablet by mouth once daily 30 tablet 0   memantine (NAMENDA) 10 MG tablet Take  1 tablet  2 x day  for Memory  / Patient knows to take by mouth 180 tablet 3   mirtazapine (REMERON) 15 MG tablet TAKE 1 TABLET BY MOUTH AT BEDTIME AS NEEDED FOR SLEEP AND  APPETITE 90 tablet 0   olmesartan (BENICAR) 40 MG tablet Take  1 tablet  at Bedtime  for BP  90 tablet 3   OVER THE COUNTER MEDICATION daily. B-Complex with 1000 mcg Biotin     pramoxine (PROCTOFOAM) 1 % foam Place 1 application rectally 3 (three) times daily as needed for anal itching. 15 g 0   rosuvastatin (CRESTOR) 20 MG tablet TAKE 1 TABLET BY MOUTH ONCE DAILY FOR CHOLESTEROL 90 tablet 1   No current facility-administered medications on file prior to visit.    Allergies  Allergen Reactions   Minocycline    Prednisone Swelling   Sulfa Antibiotics     Patient does not remember the type of reaction, happened years ago.   Zithromax [Azithromycin] Swelling   Ciprofloxacin Hcl Rash   Penicillins Rash    Current Problems (verified) Patient Active Problem List   Diagnosis Date Noted   Stage 3a chronic kidney disease ([place]) 11/30/2020   History of breast cancer 11/30/2020   Mild malnutrition ([place]) 10/01/2018   Vascular dementia without behavioral disturbance ([place])  06/22/2018   B12 deficiency 06/22/2018   Abnormal glucose 06/22/2018   Memory changes 10/27/2017   BMI 22.0-22.9, adult 05/08/2015   Open-angle glaucoma 05/08/2015   Medication management 08/30/2013   Vitamin D deficiency    GERD (gastroesophageal reflux disease)    Hyperlipidemia, mixed 10/18/2010   Essential hypertension 10/18/2010    Screening Tests Immunization History  Administered Date(s) Administered   Influenza, High Dose Seasonal PF 03/07/2014, 05/08/2015, 03/11/2017, 03/03/2018, 04/06/2019, 04/20/2021   PFIZER(Purple Top)SARS-COV-2 Vaccination 07/02/2019   PPD Test 08/30/2013, 10/04/2014, 10/24/2015   Pneumococcal Conjugate-13 03/24/2014   Pneumococcal Polysaccharide-23 09/08/2006, 03/11/2017   Tdap 08/27/2012   Zoster, Live 09/03/2012   Health Maintenance  Topic Date Due   Zoster Vaccines- Shingrix (1 of 2) Never done   COVID-19 Vaccine (2 - Pfizer risk series) 07/23/2019   INFLUENZA VACCINE  01/08/2022   TETANUS/TDAP  08/28/2022   COLONOSCOPY (Pts 45-44yr Insurance coverage will need to be confirmed)  03/30/2023   Pneumonia Vaccine 76 Years old  Completed   DEXA SCAN  Completed   Hepatitis C Screening  Completed   HPV VACCINES  Aged Out     Preventative care: Last colonoscopy: 2014 Last mammogram: 2011 s/p double mastectomy Last pap smear/pelvic exam: 2015 declines another DEXA:12/2019 osteopenia, R fem T-1.8  Prior vaccinations: TD or Tdap: 2014  Influenza: 2021 Pneumococcal: 2018 Prevnar13: 2015 Shingles/Zostavax: 2014 - [facility]: reports had 2/2 + boosters - requested send card  Names of Other Physician/Practitioners you currently use: 1. [facility] here for primary care 2. [facility], last visit 11/2020 3. Dr. ALilian Comadentist, last visit Q6 months, last 2022  Patient Care Team: MUnk Pinto MD as PCP - General (Internal Medicine) MClarene Essex MD as Consulting Physician  (Gastroenterology) KMeda Klinefelter MD as Consulting Physician (Endocrinology) SMordecai Rasmussen MD as Consulting Physician (Surgery)  SURGICAL HISTORY She  has a past surgical history that includes Mastectomy (August 2011); Parathyroidectomy; and Nasal sinus surgery. FAMILY HISTORY Her family history includes Alzheimer's disease in her mother; Cancer in her mother; Dementia in her maternal grandfather and maternal grandmother; Diabetes in her mother; Heart attack in her brother; Heart attack (age of onset: 435 in her father. SOCIAL HISTORY She  reports that she has never smoked. She has never used smokeless tobacco. She reports that she does not drink alcohol and does not use drugs.   MEDICARE WELLNESS OBJECTIVES: Physical activity:   Cardiac risk factors:   Depression/mood screen:  04/20/2021   12:20 AM  Depression screen PHQ 2/9  Decreased Interest 0  Down, Depressed, Hopeless 0  PHQ - 2 Score 0    ADLs:     04/20/2021   12:21 AM  In your present state of health, do you have any difficulty performing the following activities:  Hearing? 0  Vision? 0  Difficulty concentrating or making decisions? 0  Walking or climbing stairs? 0  Dressing or bathing? 0  Doing errands, shopping? 0     Cognitive Testing  Alert? Yes  Normal Appearance?Yes  Oriented to person? Yes  Place? Yes   Time? Yes  Recall of three objects?  No, 1/3  Can perform simple calculations? Yes  Displays appropriate judgment?Yes  Can read the correct time from a watch face?Yes  EOL planning:    Review of Systems  Constitutional: Negative.  Negative for malaise/fatigue and weight loss.  HENT: Negative.  Negative for hearing loss and tinnitus.   Eyes: Negative.  Negative for blurred vision and double vision.  Respiratory: Negative.  Negative for cough, sputum production, shortness of breath and wheezing.   Cardiovascular: Negative.  Negative for chest pain, palpitations, orthopnea,  claudication, leg swelling and PND.  Gastrointestinal: Negative.  Negative for abdominal pain, blood in stool, constipation, diarrhea, heartburn, melena, nausea and vomiting.  Genitourinary: Negative.   Musculoskeletal: Negative.  Negative for falls, joint pain and myalgias.  Skin: Negative.  Negative for rash.  Neurological: Negative.  Negative for dizziness, tingling, sensory change, weakness and headaches.  Endo/Heme/Allergies: Negative.  Negative for polydipsia.  Psychiatric/Behavioral:  Positive for memory loss (stable). Negative for depression, substance abuse and suicidal ideas. The patient is not nervous/anxious and does not have insomnia.   All other systems reviewed and are negative.    Objective:     There were no vitals filed for this visit.  There is no height or weight on file to calculate BMI.  General appearance: alert, no distress, WD/WN, female HEENT: normocephalic, sclerae anicteric, TMs pearly, nares patent, no discharge or erythema, pharynx normal Oral cavity: MMM, no lesions Neck: supple, no lymphadenopathy, no thyromegaly, no masses Heart: RRR, normal S1, S2, no murmurs Lungs: CTA bilaterally, no wheezes, rhonchi, or rales Abdomen: +bs, soft, non tender, non distended, no masses, no hepatomegaly, no splenomegaly Musculoskeletal: nontender, no swelling, no obvious deformity Extremities: no edema, no cyanosis, no clubbing Pulses: 2+ symmetric, upper and lower extremities, normal cap refill Neurological: alert, oriented x 2, CN2-12 intact, strength normal upper extremities and lower extremities, sensation normal throughout, DTRs 2+ throughout, no cerebellar signs, gait normal Psychiatric: normal affect, behavior normal, pleasant   Medicare Attestation I have personally reviewed: The patient's medical and social history Their use of alcohol, tobacco or illicit drugs Their current medications and supplements The patient's functional ability including ADLs,fall  risks, home safety risks, cognitive, and hearing and visual impairment Diet and physical activities Evidence for depression or mood disorders  The patient's weight, height, BMI, and visual acuity have been recorded in the chart.  I have made referrals, counseling, and provided education to the patient based on review of the above and I have provided the patient with a written personalized care plan for preventive services.     Izora Ribas, NP   12/04/2021

## 2021-12-17 ENCOUNTER — Other Ambulatory Visit: Payer: Self-pay | Admitting: Internal Medicine

## 2021-12-17 DIAGNOSIS — I10 Essential (primary) hypertension: Secondary | ICD-10-CM

## 2021-12-20 NOTE — Progress Notes (Signed)
MEDICARE ANNUAL WELLNESS VISIT AND 3 MONTH FOLLOW UP  Assessment:    Encounter for Medicare annual wellness exam 1 year   Essential hypertension Continue current medications: Atenolol and Olmesartan 40 mg  Monitor blood pressure at home; call if consistently over 130/80 Continue DASH diet.   Reminder to go to the ER if any CP, SOB, nausea, dizziness, severe HA, changes vision/speech, left arm numbness and tingling and jaw pain. -     CBC with Differential/Platelet -     COMPLETE METABOLIC PANEL WITH GFR -     TSH  Hyperlipidemia, mixed Continue medications: rosuvastatin 58m 1/2 tab daily Discussed dietary and exercise modifications Low fat diet  Vascular dementia without behavioral disturbance (HCC) Taking namenda and aricept Stable, no concerns Follows with Dr. AJaynee Eagles Gastroesophageal reflux disease, esophagitis presence not specified Continue PPI/H2 blocker, diet discussed  Malignant neoplasm of upper-outer quadrant of left breast in female, estrogen receptor positive ([place] S/p double mastectomy- no further need for   Open-angle glaucoma of right eye, unspecified glaucoma stage, unspecified open-angle glaucoma type S/p surgery doing well  Mild malnutrition (HCC) Increase weight Monitor closely Improved with remeron  Hyperlipidemia, mixed -     Lipid panel check lipids decrease fatty foods increase activity.  Abnormal glucose Recent A1Cs at goal Discussed diet/exercise, weight management  Defer A1C; check CMP   Body mass index (BMI) 21 Increase weight Monitor closely DOing well with remeron   Medication management Continued  Vitamin D deficiency Continue supplement  External Hemorrhoid Currently managed with increased fiber and water, proctofoam PRN  Over 40 minutes of chart review, face to face exam, counseling, chart review and critical decision making was performed. Future Appointments  Date Time Provider [place] 04/22/2022  11:00 AM MUnk Pinto MD [facility] None  11/05/2022  2:00 PM WAlycia Rossetti NP [facility] None     Plan:   During the course of the visit the patient was educated and counseled about appropriate screening and preventive services including:   Pneumococcal vaccine  Prevnar 13 Influenza vaccine Td vaccine Screening electrocardiogram Bone densitometry screening Colorectal cancer screening Diabetes screening Glaucoma screening Nutrition counseling  Advanced directives: requested   Subjective:  Shannon MISENHEIMERis a 76y.o. female who presents for Medicare Annual Wellness Visit and 3 month follow up.   She is accompanied by her husband, [name] today.  She has history of dementia and follows with Dr AJaynee Eaglesfor this in the past.  She is taking aricept and namenda.  She is alert and oriented x2.  She is pleasant with conversation. He manages meds and finances, driving. She has sitter while he is at work. Denies concerns today.   She has history of breast cancer (2011) s/p bilateral mastectomy and radiation complete Nov 2011.  She had neuropathy during treatments but has since resolved.  [facility] H[facility]lis Porta She is doing well and has routine eye exams.    BMI is Body mass index is 22.47 kg/m.  She has been walking 1 1/4 mile daily with husband. Doing indoor exercise video with the heat. Appetite improved recently.  Wt Readings from Last 3 Encounters:  12/24/21 139 lb 3.2 oz (63.1 kg)  10/25/21 138 lb 12.8 oz (63 kg)  09/26/21 138 lb 3.2 oz (62.7 kg)   Her blood pressure has been controlled at home, currently on Atenolol 100 mg QD and Olmesartan 40 mg QD. Today their BP is BP: 118/72  BP  Readings from Last 3 Encounters:  12/24/21 118/72  10/25/21 133/72  09/26/21 138/78  She does workout , gentle walking twice a week. She denies chest pain, shortness of breath, dizziness.   She is on cholesterol medication (rosuvastatin 20 mg  1/2 tab daily) and denies myalgias. Her cholesterol is at goal. The cholesterol last visit was:   Lab Results  Component Value Date   CHOL 167 07/25/2021   HDL 64 07/25/2021   LDLCALC 86 07/25/2021   TRIG 79 07/25/2021   CHOLHDL 2.6 07/25/2021   She has a history of preDM but has decreased sugar and has increased water. Last A1C in the office was:  Lab Results  Component Value Date   HGBA1C 5.7 (H) 04/20/2021   She has history of CKD borderline II/III at baseline for many years, denies NSAIDs. Trying to increase fluid intake. Last GFR: Lab Results  Component Value Date   EGFR 55 (L) 07/25/2021   Hemorrhoid has not caused issues recently.  Has increased fiber and water intake   Patient is on Vitamin D supplement.   Lab Results  Component Value Date   VD25OH 70 04/20/2021        Medication Review: Current Outpatient Medications on File Prior to Visit  Medication Sig Dispense Refill   acetaminophen (TYLENOL) 500 MG tablet Take 500 mg by mouth every 6 (six) hours as needed.     aspirin 81 MG tablet Take 81 mg by mouth daily.      atenolol (TENORMIN) 100 MG tablet Take 1 tablet by mouth once daily for blood pressure 90 tablet 0   Cholecalciferol (VITAMIN D3) 1.25 MG (50000 UT) CAPS TAKE ONE CAPSULE THREE DAYS A WEEK FOR TWELVE WEEKS. (Patient taking differently: 2,000 Units daily.) 36 capsule 0   diclofenac Sodium (VOLTAREN) 1 % GEL Apply 2 g topically 4 (four) times daily.     donepezil (ARICEPT) 10 MG tablet Take 1 tablet at Bedtime for Memory 90 tablet 3   fluticasone (FLONASE) 50 MCG/ACT nasal spray INSTILL 1 SPRAY INTO EACH NOSTRIL TWICE A DAY 48 mL 3   hydrocortisone cream 1 % Apply to affected area 2 times daily 30 g 1   latanoprost (XALATAN) 0.005 % ophthalmic solution Place 1 drop into both eyes at bedtime.     meloxicam (MOBIC) 7.5 MG tablet Take 1 tablet by mouth once daily 30 tablet 0   memantine (NAMENDA) 10 MG tablet Take  1 tablet  2 x /day  for Memory                                              /                           TAKE                                BY                                   MOUTH 180 tablet 3   mirtazapine (REMERON) 15 MG tablet TAKE 1 TABLET BY MOUTH AT BEDTIME AS NEEDED FOR SLEEP AND  APPETITE 90 tablet 0   olmesartan (BENICAR) 40  MG tablet Take  1 tablet  at Bedtime  for BP 90 tablet 3   OVER THE COUNTER MEDICATION daily. B-Complex with 1000 mcg Biotin     rosuvastatin (CRESTOR) 20 MG tablet TAKE 1 TABLET BY MOUTH ONCE DAILY FOR CHOLESTEROL 90 tablet 1   letrozole (FEMARA) 2.5 MG tablet letrozole 2.5 mg tablet     pramoxine (PROCTOFOAM) 1 % foam Place 1 application rectally 3 (three) times daily as needed for anal itching. (Patient not taking: Reported on 12/24/2021) 15 g 0   No current facility-administered medications on file prior to visit.    Allergies  Allergen Reactions   Minocycline    Prednisone Swelling   Sulfa Antibiotics     Patient does not remember the type of reaction, happened years ago.   Zithromax [Azithromycin] Swelling   Ciprofloxacin Hcl Rash   Penicillins Rash    Current Problems (verified) Patient Active Problem List   Diagnosis Date Noted   History of primary hyperparathyroidism 12/04/2021   Stage 3a chronic kidney disease ([place]) 11/30/2020   History of breast cancer 11/30/2020   Mild malnutrition ([place]) 10/01/2018   Vascular dementia without behavioral disturbance ([place]) 06/22/2018   B12 deficiency 06/22/2018   Abnormal glucose 06/22/2018   Memory changes 10/27/2017   BMI 22.0-22.9, adult 05/08/2015   Open-angle glaucoma 05/08/2015   Medication management 08/30/2013   Vitamin D deficiency    GERD (gastroesophageal reflux disease)    Hyperlipidemia, mixed 10/18/2010   Essential hypertension 10/18/2010    Screening Tests Immunization History  Administered Date(s) Administered   Influenza, High Dose Seasonal PF 03/07/2014, 05/08/2015, 03/11/2017, 03/03/2018, 04/06/2019, 04/20/2021    PFIZER(Purple Top)SARS-COV-2 Vaccination 07/02/2019   PPD Test 08/30/2013, 10/04/2014, 10/24/2015   Pneumococcal Conjugate-13 03/24/2014   Pneumococcal Polysaccharide-23 09/08/2006, 03/11/2017   Tdap 08/27/2012   Zoster, Live 09/03/2012   Preventative care: Last colonoscopy: 2014 Last mammogram: 2011 s/p double mastectomy Last pap smear/pelvic exam: 2015 declines another DEXA:12/2019 osteopenia, R fem T-1.8  Prior vaccinations: TD or Tdap: 2014  Influenza: 2021 Pneumococcal: 2018 Prevnar13: 2015 Shingles/Zostavax: 2014 - plans to get at [name] Covid 19: reports had 2/2 + boosters - requested send card  Names of Other Physician/Practitioners you currently use: 1. [facility] here for primary care 2. Delman Cheadle, eye doctor, last visit 11/2020 3. Dr. Lilian Coma dentist, last visit Q6 months, last 2022  Patient Care Team: Unk Pinto, MD as PCP - General (Internal Medicine) Clarene Essex, MD as Consulting Physician (Gastroenterology) Meda Klinefelter, MD as Consulting Physician (Endocrinology) Mordecai Rasmussen, MD as Consulting Physician (Surgery)  SURGICAL HISTORY She  has a past surgical history that includes Mastectomy (August 2011); Parathyroidectomy; and Nasal sinus surgery. FAMILY HISTORY Her family history includes Alzheimer's disease in her mother; Cancer in her mother; Dementia in her maternal grandfather and maternal grandmother; Diabetes in her mother; Heart attack in her brother; Heart attack (age of onset: 20) in her father. SOCIAL HISTORY She  reports that she has never smoked. She has never used smokeless tobacco. She reports that she does not drink alcohol and does not use drugs.   MEDICARE WELLNESS OBJECTIVES: Physical activity: Current Exercise Habits: Home exercise routine, Type of exercise: walking;calisthenics, Time (Minutes): 30, Frequency (Times/Week): 5, Weekly Exercise (Minutes/Week): 150, Intensity: Mild, Exercise  limited by: psychological condition(s) Cardiac risk factors: Cardiac Risk Factors include: advanced age (>62mn, >>105women);dyslipidemia;hypertension Depression/mood screen:      12/24/2021   11:57 AM  Depression screen PHQ 2/9  Decreased  Interest 0  Down, Depressed, Hopeless 0  PHQ - 2 Score 0    ADLs:     12/24/2021   11:58 AM 04/20/2021   12:21 AM  In your present state of health, do you have any difficulty performing the following activities:  Hearing? 0 0  Vision? 0 0  Difficulty concentrating or making decisions? 1 0  Walking or climbing stairs? 0 0  Dressing or bathing? 0 0  Doing errands, shopping? 1 0     Cognitive Testing  Alert? Yes  Normal Appearance?Yes  Oriented to person? Yes  Place? Yes   Time? No  Recall of three objects?  No, 1/3  Can perform simple calculations? No  Displays appropriate judgment?No  Can read the correct time from a watch face?Yes  EOL planning:    Review of Systems  Constitutional: Negative.  Negative for malaise/fatigue and weight loss.  HENT: Negative.  Negative for hearing loss and tinnitus.   Eyes: Negative.  Negative for blurred vision and double vision.  Respiratory: Negative.  Negative for cough, sputum production, shortness of breath and wheezing.   Cardiovascular: Negative.  Negative for chest pain, palpitations, orthopnea, claudication, leg swelling and PND.  Gastrointestinal: Negative.  Negative for abdominal pain, blood in stool, constipation, diarrhea, heartburn, melena, nausea and vomiting.  Genitourinary: Negative.   Musculoskeletal: Negative.  Negative for falls, joint pain and myalgias.  Skin: Negative.  Negative for rash.  Neurological: Negative.  Negative for dizziness, tingling, sensory change, weakness and headaches.  Endo/Heme/Allergies: Negative.  Negative for polydipsia.  Psychiatric/Behavioral:  Positive for memory loss (stable). Negative for depression, substance abuse and suicidal ideas. The patient is not  nervous/anxious and does not have insomnia.   All other systems reviewed and are negative.    Objective:     Today's Vitals   12/24/21 1132  BP: 118/72  Pulse: 60  Temp: 97.7 F (36.5 C)  SpO2: 99%  Weight: 139 lb 3.2 oz (63.1 kg)   Body mass index is 22.47 kg/m.  General appearance: alert, no distress, WD/WN, female HEENT: normocephalic, sclerae anicteric, TMs pearly, nares patent, no discharge or erythema, pharynx normal Oral cavity: MMM, no lesions Neck: supple, no lymphadenopathy, no thyromegaly, no masses Heart: RRR, normal S1, S2, no murmurs Lungs: CTA bilaterally, no wheezes, rhonchi, or rales Abdomen: +bs, soft, non tender, non distended, no masses, no hepatomegaly, no splenomegaly Musculoskeletal: nontender, no swelling, no obvious deformity Extremities: no edema, no cyanosis, no clubbing Pulses: 2+ symmetric, upper and lower extremities, normal cap refill Neurological: alert, oriented x 2, CN2-12 intact, strength normal upper extremities and lower extremities, sensation normal throughout, DTRs 2+ throughout, no cerebellar signs, gait normal Psychiatric: normal affect, behavior normal, pleasant   Medicare Attestation I have personally reviewed: The patient's medical and social history Their use of alcohol, tobacco or illicit drugs Their current medications and supplements The patient's functional ability including ADLs,fall risks, home safety risks, cognitive, and hearing and visual impairment Diet and physical activities Evidence for depression or mood disorders  The patient's weight, height, BMI, and visual acuity have been recorded in the chart.  I have made referrals, counseling, and provided education to the patient based on review of the above and I have provided the patient with a written personalized care plan for preventive services.     Alycia Rossetti, NP   12/24/2021

## 2021-12-22 ENCOUNTER — Other Ambulatory Visit: Payer: Self-pay | Admitting: Adult Health

## 2021-12-22 DIAGNOSIS — R413 Other amnesia: Secondary | ICD-10-CM

## 2021-12-22 DIAGNOSIS — F0150 Vascular dementia without behavioral disturbance: Secondary | ICD-10-CM

## 2021-12-24 ENCOUNTER — Encounter: Payer: Self-pay | Admitting: Nurse Practitioner

## 2021-12-24 ENCOUNTER — Ambulatory Visit (INDEPENDENT_AMBULATORY_CARE_PROVIDER_SITE_OTHER): Payer: Medicare HMO | Admitting: Nurse Practitioner

## 2021-12-24 VITALS — BP 118/72 | HR 60 | Temp 97.7°F | Wt 139.2 lb

## 2021-12-24 DIAGNOSIS — Z6822 Body mass index (BMI) 22.0-22.9, adult: Secondary | ICD-10-CM | POA: Diagnosis not present

## 2021-12-24 DIAGNOSIS — E559 Vitamin D deficiency, unspecified: Secondary | ICD-10-CM

## 2021-12-24 DIAGNOSIS — F0150 Vascular dementia without behavioral disturbance: Secondary | ICD-10-CM

## 2021-12-24 DIAGNOSIS — C50412 Malignant neoplasm of upper-outer quadrant of left female breast: Secondary | ICD-10-CM

## 2021-12-24 DIAGNOSIS — I10 Essential (primary) hypertension: Secondary | ICD-10-CM

## 2021-12-24 DIAGNOSIS — R7309 Other abnormal glucose: Secondary | ICD-10-CM | POA: Diagnosis not present

## 2021-12-24 DIAGNOSIS — Z0001 Encounter for general adult medical examination with abnormal findings: Secondary | ICD-10-CM

## 2021-12-24 DIAGNOSIS — E441 Mild protein-calorie malnutrition: Secondary | ICD-10-CM | POA: Diagnosis not present

## 2021-12-24 DIAGNOSIS — N1831 Chronic kidney disease, stage 3a: Secondary | ICD-10-CM | POA: Diagnosis not present

## 2021-12-24 DIAGNOSIS — K219 Gastro-esophageal reflux disease without esophagitis: Secondary | ICD-10-CM | POA: Diagnosis not present

## 2021-12-24 DIAGNOSIS — R69 Illness, unspecified: Secondary | ICD-10-CM | POA: Diagnosis not present

## 2021-12-24 DIAGNOSIS — Z79899 Other long term (current) drug therapy: Secondary | ICD-10-CM

## 2021-12-24 DIAGNOSIS — E782 Mixed hyperlipidemia: Secondary | ICD-10-CM

## 2021-12-24 DIAGNOSIS — R6889 Other general symptoms and signs: Secondary | ICD-10-CM

## 2021-12-24 DIAGNOSIS — K644 Residual hemorrhoidal skin tags: Secondary | ICD-10-CM | POA: Diagnosis not present

## 2021-12-24 DIAGNOSIS — Z0000 Encounter for general adult medical examination without abnormal findings: Secondary | ICD-10-CM

## 2021-12-25 ENCOUNTER — Other Ambulatory Visit: Payer: Self-pay | Admitting: Nurse Practitioner

## 2021-12-25 DIAGNOSIS — N1831 Chronic kidney disease, stage 3a: Secondary | ICD-10-CM

## 2021-12-25 LAB — CBC WITH DIFFERENTIAL/PLATELET
Absolute Monocytes: 360 cells/uL (ref 200–950)
Basophils Absolute: 20 cells/uL (ref 0–200)
Basophils Relative: 0.6 %
Eosinophils Absolute: 20 cells/uL (ref 15–500)
Eosinophils Relative: 0.6 %
HCT: 46.2 % — ABNORMAL HIGH (ref 35.0–45.0)
Hemoglobin: 15.3 g/dL (ref 11.7–15.5)
Lymphs Abs: 1020 cells/uL (ref 850–3900)
MCH: 30.9 pg (ref 27.0–33.0)
MCHC: 33.1 g/dL (ref 32.0–36.0)
MCV: 93.3 fL (ref 80.0–100.0)
MPV: 10.5 fL (ref 7.5–12.5)
Monocytes Relative: 10.6 %
Neutro Abs: 1979 cells/uL (ref 1500–7800)
Neutrophils Relative %: 58.2 %
Platelets: 166 10*3/uL (ref 140–400)
RBC: 4.95 10*6/uL (ref 3.80–5.10)
RDW: 11.9 % (ref 11.0–15.0)
Total Lymphocyte: 30 %
WBC: 3.4 10*3/uL — ABNORMAL LOW (ref 3.8–10.8)

## 2021-12-25 LAB — LIPID PANEL
Cholesterol: 167 mg/dL (ref <200)
HDL: 70 mg/dL (ref >=50)
LDL Cholesterol (Calc): 78 mg/dL (calc)
Non-HDL Cholesterol (Calc): 97 mg/dL (calc) (ref <130)
Total CHOL/HDL Ratio: 2.4 (calc) (ref <5.0)
Triglycerides: 108 mg/dL (ref <150)

## 2021-12-25 LAB — COMPLETE METABOLIC PANEL WITH GFR
AG Ratio: 1.5 (calc) (ref 1.0–2.5)
ALT: 17 U/L (ref 6–29)
AST: 23 U/L (ref 10–35)
Albumin: 4.3 g/dL (ref 3.6–5.1)
Alkaline phosphatase (APISO): 89 U/L (ref 37–153)
BUN/Creatinine Ratio: 16 (calc) (ref 6–22)
BUN: 22 mg/dL (ref 7–25)
CO2: 27 mmol/L (ref 20–32)
Calcium: 10.5 mg/dL — ABNORMAL HIGH (ref 8.6–10.4)
Chloride: 110 mmol/L (ref 98–110)
Creat: 1.37 mg/dL — ABNORMAL HIGH (ref 0.60–1.00)
Globulin: 2.8 g/dL (calc) (ref 1.9–3.7)
Glucose, Bld: 68 mg/dL (ref 65–99)
Potassium: 4.6 mmol/L (ref 3.5–5.3)
Sodium: 145 mmol/L (ref 135–146)
Total Bilirubin: 0.6 mg/dL (ref 0.2–1.2)
Total Protein: 7.1 g/dL (ref 6.1–8.1)
eGFR: 41 mL/min/{1.73_m2} — ABNORMAL LOW (ref >=60)

## 2021-12-25 LAB — HEMOGLOBIN A1C
Hgb A1c MFr Bld: 5.6 % of total Hgb (ref <5.7)
Mean Plasma Glucose: 114 mg/dL
eAG (mmol/L): 6.3 mmol/L

## 2022-01-07 ENCOUNTER — Ambulatory Visit (INDEPENDENT_AMBULATORY_CARE_PROVIDER_SITE_OTHER): Payer: Medicare HMO

## 2022-01-07 DIAGNOSIS — N1831 Chronic kidney disease, stage 3a: Secondary | ICD-10-CM | POA: Diagnosis not present

## 2022-01-08 LAB — BASIC METABOLIC PANEL WITH GFR
BUN/Creatinine Ratio: 12 (calc) (ref 6–22)
BUN: 14 mg/dL (ref 7–25)
CO2: 30 mmol/L (ref 20–32)
Calcium: 10.5 mg/dL — ABNORMAL HIGH (ref 8.6–10.4)
Chloride: 106 mmol/L (ref 98–110)
Creat: 1.18 mg/dL — ABNORMAL HIGH (ref 0.60–1.00)
Glucose, Bld: 74 mg/dL (ref 65–99)
Potassium: 4.3 mmol/L (ref 3.5–5.3)
Sodium: 143 mmol/L (ref 135–146)
eGFR: 48 mL/min/{1.73_m2} — ABNORMAL LOW (ref >=60)

## 2022-02-04 ENCOUNTER — Other Ambulatory Visit: Payer: Self-pay

## 2022-02-04 MED ORDER — ROSUVASTATIN CALCIUM 20 MG PO TABS
ORAL_TABLET | ORAL | 1 refills | Status: DC
Start: 2022-02-04 — End: 2023-06-05

## 2022-02-11 ENCOUNTER — Other Ambulatory Visit: Payer: Self-pay | Admitting: Internal Medicine

## 2022-02-11 DIAGNOSIS — F0280 Dementia in other diseases classified elsewhere without behavioral disturbance: Secondary | ICD-10-CM

## 2022-02-12 ENCOUNTER — Other Ambulatory Visit: Payer: Self-pay | Admitting: Nurse Practitioner

## 2022-02-12 DIAGNOSIS — R634 Abnormal weight loss: Secondary | ICD-10-CM

## 2022-02-12 MED ORDER — MIRTAZAPINE 15 MG PO TABS: ORAL_TABLET | ORAL | 0 refills | Status: DC

## 2022-03-14 ENCOUNTER — Other Ambulatory Visit: Payer: Self-pay | Admitting: Internal Medicine

## 2022-04-01 ENCOUNTER — Other Ambulatory Visit: Payer: Self-pay | Admitting: Nurse Practitioner

## 2022-04-01 DIAGNOSIS — I10 Essential (primary) hypertension: Secondary | ICD-10-CM

## 2022-04-21 ENCOUNTER — Encounter: Payer: Self-pay | Admitting: Internal Medicine

## 2022-04-21 NOTE — Progress Notes (Unsigned)
Annual Screening/Preventative Visit & Comprehensive Evaluation &  Examination  Future Appointments  Date Time Provider Department  04/22/2022 11:00 AM Unk Pinto, MD Georgina Quint  Alycia Rossetti, NP [facility]  04/25/2023 11:00 AM Unk Pinto, MD [facility]         This very nice 76 y.o. MBF presents for a Screening /Preventative Visit & comprehensive evaluation and management of multiple medical co-morbidities.  Patient has been followed for HTN, HLD, T2_NIDDM  Prediabetes  and Vitamin D Deficiency. Patient has GERD controlled with diet. Patient had excision of a parathyroid adenoma in 2001 for primary hyperparathyroidism.                                                     Patient also has Vascular Dementia & is on Aricept/Namenda followed by Dr [name] .  Patient is capable of ADLs /personal hygiene, but requires 24 hr moderate supervision & guidance per spouse or sitter /companion           HTN predates since 35. Patient's BP has been controlled at home. Patient has CKD3a  ( GFR 48 ) consequent of her HTCVD.  and patient denies any cardiac symptoms as chest pain, palpitations, shortness of breath, dizziness or ankle swelling. Today's BP  is  at goal - 124/80. Per husband , no noted postural presyncopal sx's.         Patient's hyperlipidemia is controlled with diet and Rosuvastatin. Patient denies myalgias or other medication SE's. Last lipids were at goal :  Lab Results  Component Value Date   CHOL 167 12/24/2021   HDL 70 12/24/2021   LDLCALC 78 12/24/2021   TRIG 108 12/24/2021   CHOLHDL 2.4 12/24/2021         Patient has hx/o prediabetes (A1c 6.0% /2011) and patient denies reactive hypoglycemic symptoms, visual blurring, diabetic polys or paresthesias. Last A1c was normal & at goal :  Lab Results  Component Value Date   HGBA1C 5.6 12/24/2021         Finally, patient has history of Vitamin D Deficiency ("13" /2008) and last Vitamin D was at goal  :  Lab Results  Component Value Date   VD25OH 70 04/20/2021        Current Outpatient Medications  Medication Instructions   acetaminophen   500 mg Every 6 hours PRN   Aspirin  81 mg  Daily   atenolol  100 MG tablet Take 1 tablet   daily    VITAMIN D 50,000  u TAKE ONE CAPSULE THREE DAYS A WEEK    Diclofenac   2 g Topical 4 times daily   donepezil  10 MG tablet TAKE 1 TABLET  AT BEDTIME    FLONASE nasal spray 1 SPRAY INTO EACH NOSTRIL TWICE A DAY   hydrocortisone cream 1 % Apply to affected area 2 times daily   XALATAN 0.005 % ophth soln 1 drop  Daily at bedtime   letrozole 2.5 MG tablet daily   memantine  10 MG tablet Take  1 tablet  2 x /day   mirtazapine  15 MG tablet TAKE 1 TABLET  AT BEDTIME    olmesartan 40 MG tablet TAKE 1 TABLET  AT BEDTIME    B-Complex with 1000 mcg Biotin  Daily   rosuvastatin   20 MG tablet TAKE 1  TABLET   DAILY      Allergies  Allergen Reactions   Minocycline    Prednisone Swelling   Sulfa Antibiotics     Patient does not remember the type of reaction, happened years ago.   Zithromax [Azithromycin] Swelling   Ciprofloxacin Hcl Rash   Penicillins Rash     Past Medical History:  Diagnosis Date   Breast cancer ([place])    Breast cancer of upper-outer quadrant of left female breast ([place]) 07/08/2011   Chest tightness    GERD (gastroesophageal reflux disease)    Hepatitis C virus screening Negative 08/31/2012 03/08/2018   Hyperlipidemia    Hyperparathyroidism, primary ([place]) 08/12/2012   Hypertension    Palpitations    Prediabetes    Vitamin D deficiency      Health Maintenance  Topic Date Due   Zoster Vaccines- Shingrix (1 of 2) Never done   COVID-19 Vaccine (2 - Pfizer risk series) 07/23/2019   INFLUENZA VACCINE  01/08/2021   TETANUS/TDAP  08/28/2022   COLONOSCOPY  03/30/2023   Pneumonia Vaccine 87+ Years old  Completed   DEXA SCAN  Completed   Hepatitis C Screening  Completed   HPV VACCINES  Aged Out     Immunization History   Administered Date(s) Administered   Influenza, High Dose  03/03/2018, 04/06/2019   PFIZER SARS-COV-2 Vacc 07/02/2019   PPD Test 08/30/2013, 10/04/2014, 10/24/2015   Pneumococcal -13 03/24/2014   Pneumococcal -23 09/08/2006, 03/11/2017   Tdap 08/27/2012   Zoster, Live 09/03/2012    Last Colon -  04/02/2013 - Dr Watt Climes - recc 5 yr F/U - overdue   Cologard - 04/26/2021- Negative - Recc f/u 3 year Nov 2025   Last MGM - Last 2015 - s/p Bilat Mastectomies 2011  Last BMD - 01/06/2020  Past Surgical History:  Procedure Laterality Date   MASTECTOMY  August 2011   Bilateral   NASAL SINUS SURGERY     PARATHYROIDECTOMY       Family History  Problem Relation Age of Onset   Diabetes Mother    Cancer Mother        breast   Alzheimer's disease Mother    Heart attack Father 32   Heart attack Brother    Dementia Maternal Grandmother    Dementia Maternal Grandfather      Social History   Tobacco Use   Smoking status: Never   Smokeless tobacco: Never  Vaping Use   Vaping Use: Never used  Substance Use Topics   Alcohol use: No   Drug use: Never      ROS Constitutional: Denies fever, chills, weight loss/gain, headaches, insomnia,  night sweats, and change in appetite. Does c/o fatigue. Eyes: Denies redness, blurred vision, diplopia, discharge, itchy, watery eyes.  ENT: Denies discharge, congestion, post nasal drip, epistaxis, sore throat, earache, hearing loss, dental pain, Tinnitus, Vertigo, Sinus pain, snoring.  Cardio: Denies chest pain, palpitations, irregular heartbeat, syncope, dyspnea, diaphoresis, orthopnea, PND, claudication, edema Respiratory: denies cough, dyspnea, DOE, pleurisy, hoarseness, laryngitis, wheezing.  Gastrointestinal: Denies dysphagia, heartburn, reflux, water brash, pain, cramps, nausea, vomiting, bloating, diarrhea, constipation, hematemesis, melena, hematochezia, jaundice, hemorrhoids Genitourinary: Denies dysuria, frequency, urgency, nocturia,  hesitancy, discharge, hematuria, flank pain Breast: Breast lumps, nipple discharge, bleeding.  Musculoskeletal: Denies arthralgia, myalgia, stiffness, Jt. Swelling, pain, limp, and strain/sprain. Denies falls. Skin: Denies puritis, rash, hives, warts, acne, eczema, changing in skin lesion Neuro: No weakness, tremor, incoordination, spasms, paresthesia, pain Psychiatric: Denies confusion, memory loss, sensory loss. Denies Depression. Endocrine: Denies  change in weight, skin, hair change, nocturia, and paresthesia, diabetic polys, visual blurring, hyper / hypo glycemic episodes.  Heme/Lymph: No excessive bleeding, bruising, enlarged lymph nodes.  Physical Exam  There were no vitals taken for this visit.  General Appearance: Very thin habitus and in no apparent distress.  Eyes: PERRLA, EOMs, conjunctiva no swelling or erythema, normal fundi and vessels. Sinuses: No frontal/maxillary tenderness ENT/Mouth: EACs patent / TMs  nl. Nares clear without erythema, swelling, mucoid exudates. Oral hygiene is good. No erythema, swelling, or exudate. Tongue normal, non-obstructing. Tonsils not swollen or erythematous. Hearing normal.  Neck: Supple, thyroid not palpable. No bruits, nodes or JVD. Respiratory: Respiratory effort normal.  BS equal and clear bilateral without rales, rhonci, wheezing or stridor. Cardio: Heart sounds are normal with regular rate and rhythm and no murmurs, rubs or gallops. Peripheral pulses are normal and equal bilaterally without edema. No aortic or femoral bruits. Chest: symmetric with normal excursions and percussion. Breasts: Surgically absent w/o  lumps,  retractions, or palpable breast tissue..  Abdomen: Flat, soft with bowel sounds active. Nontender, no guarding, rebound, hernias, masses, or organomegaly.  Lymphatics: Non tender without lymphadenopathy.  Musculoskeletal: Full ROM all peripheral extremities, joint stability, 5/5 strength, and normal gait. Skin: Warm and dry  without rashes, lesions, cyanosis, clubbing or  ecchymosis.  Neuro: Cranial nerves intact, reflexes equal bilaterally. (+) Snout & palmo-mental reflexes.  Normal muscle tone, no cerebellar symptoms. Sensation intact.  Pysch: Alert and oriented  x 1-2, pleasant  affect, Insight and Judgment very limited     Assessment and Plan  1. Annual Preventative Screening Examination   2. Essential hypertension  - EKG 12-Lead - Urinalysis, Routine w reflex microscopic - Microalbumin / creatinine urine ratio - CBC with Differential/Platelet - COMPLETE METABOLIC PANEL WITH GFR - Magnesium - TSH  3. Hyperlipidemia, mixed  - EKG 12-Lead - Lipid panel - TSH  4. Vitamin D deficiency  - VITAMIN D 25 Hydroxy  5. Abnormal glucose  - EKG 12-Lead - Hemoglobin A1c - Insulin, random  6. Vascular dementia without behavioral disturbance (HCC)  - Vitamin B12  7. Stage 3a chronic kidney disease (HCC)  - PTH, intact and calcium - COMPLETE METABOLIC PANEL WITH GFR  8. Mild malnutrition ([place])   9. Hyperparathyroidism, primary ([place])  - PTH, intact and calcium  10. Screening for colorectal cancer  - POC Hemoccult Bld/Stl   11. Screening for heart disease  - EKG 12-Lead  12. FHx: heart disease  - EKG 12-Lead  13. History of breast cancer  - CBC with Differential/Platelet - COMPLETE METABOLIC PANEL WITH GFR  14. B12 deficiency  - Vitamin B12  15. Screening for osteoporosis  - DG Bone Density; Future  16. Medication management  - CBC with Differential/Platelet - COMPLETE METABOLIC PANEL WITH GFR - Magnesium - Lipid panel - TSH - Hemoglobin A1c - Insulin, random - VITAMIN D 25 Hydroxy          Patient was counseled in prudent diet to achieve/maintain BMI less than 25 for weight control, BP monitoring, regular exercise and medications. Discussed med's effects and SE's. Screening labs and tests as requested with regular follow-up as recommended. Over 40 minutes of  exam, counseling, chart review and high complex critical decision making was performed.   Kirtland Bouchard, MD

## 2022-04-21 NOTE — Patient Instructions (Signed)

## 2022-04-22 ENCOUNTER — Ambulatory Visit (INDEPENDENT_AMBULATORY_CARE_PROVIDER_SITE_OTHER): Payer: Medicare HMO | Admitting: Internal Medicine

## 2022-04-22 ENCOUNTER — Encounter: Payer: Self-pay | Admitting: Internal Medicine

## 2022-04-22 VITALS — BP 124/80 | HR 50 | Temp 97.7°F | Resp 16 | Ht 66.0 in | Wt 140.6 lb

## 2022-04-22 DIAGNOSIS — F0150 Vascular dementia without behavioral disturbance: Secondary | ICD-10-CM | POA: Diagnosis not present

## 2022-04-22 DIAGNOSIS — Z0000 Encounter for general adult medical examination without abnormal findings: Secondary | ICD-10-CM | POA: Diagnosis not present

## 2022-04-22 DIAGNOSIS — Z136 Encounter for screening for cardiovascular disorders: Secondary | ICD-10-CM | POA: Diagnosis not present

## 2022-04-22 DIAGNOSIS — I10 Essential (primary) hypertension: Secondary | ICD-10-CM | POA: Diagnosis not present

## 2022-04-22 DIAGNOSIS — R7309 Other abnormal glucose: Secondary | ICD-10-CM | POA: Diagnosis not present

## 2022-04-22 DIAGNOSIS — Z0001 Encounter for general adult medical examination with abnormal findings: Secondary | ICD-10-CM

## 2022-04-22 DIAGNOSIS — E782 Mixed hyperlipidemia: Secondary | ICD-10-CM

## 2022-04-22 DIAGNOSIS — Z79899 Other long term (current) drug therapy: Secondary | ICD-10-CM

## 2022-04-22 DIAGNOSIS — E441 Mild protein-calorie malnutrition: Secondary | ICD-10-CM

## 2022-04-22 DIAGNOSIS — E538 Deficiency of other specified B group vitamins: Secondary | ICD-10-CM

## 2022-04-22 DIAGNOSIS — E210 Primary hyperparathyroidism: Secondary | ICD-10-CM

## 2022-04-22 DIAGNOSIS — R69 Illness, unspecified: Secondary | ICD-10-CM | POA: Diagnosis not present

## 2022-04-22 DIAGNOSIS — Z853 Personal history of malignant neoplasm of breast: Secondary | ICD-10-CM | POA: Diagnosis not present

## 2022-04-22 DIAGNOSIS — E559 Vitamin D deficiency, unspecified: Secondary | ICD-10-CM

## 2022-04-22 DIAGNOSIS — Z13820 Encounter for screening for osteoporosis: Secondary | ICD-10-CM

## 2022-04-22 DIAGNOSIS — N1831 Chronic kidney disease, stage 3a: Secondary | ICD-10-CM | POA: Diagnosis not present

## 2022-04-22 DIAGNOSIS — Z1211 Encounter for screening for malignant neoplasm of colon: Secondary | ICD-10-CM

## 2022-04-22 DIAGNOSIS — Z8249 Family history of ischemic heart disease and other diseases of the circulatory system: Secondary | ICD-10-CM

## 2022-04-23 LAB — COMPLETE METABOLIC PANEL WITH GFR
AG Ratio: 1.6 (calc) (ref 1.0–2.5)
ALT: 19 U/L (ref 6–29)
AST: 24 U/L (ref 10–35)
Albumin: 4.3 g/dL (ref 3.6–5.1)
Alkaline phosphatase (APISO): 101 U/L (ref 37–153)
BUN/Creatinine Ratio: 13 (calc) (ref 6–22)
BUN: 14 mg/dL (ref 7–25)
CO2: 29 mmol/L (ref 20–32)
Calcium: 10.5 mg/dL — ABNORMAL HIGH (ref 8.6–10.4)
Chloride: 106 mmol/L (ref 98–110)
Creat: 1.11 mg/dL — ABNORMAL HIGH (ref 0.60–1.00)
Globulin: 2.7 g/dL (calc) (ref 1.9–3.7)
Glucose, Bld: 85 mg/dL (ref 65–99)
Potassium: 4.4 mmol/L (ref 3.5–5.3)
Sodium: 142 mmol/L (ref 135–146)
Total Bilirubin: 0.7 mg/dL (ref 0.2–1.2)
Total Protein: 7 g/dL (ref 6.1–8.1)
eGFR: 52 mL/min/{1.73_m2} — ABNORMAL LOW (ref >=60)

## 2022-04-23 LAB — HEMOGLOBIN A1C
Hgb A1c MFr Bld: 5.8 % of total Hgb — ABNORMAL HIGH (ref <5.7)
Mean Plasma Glucose: 120 mg/dL
eAG (mmol/L): 6.6 mmol/L

## 2022-04-23 LAB — VITAMIN B12: Vitamin B-12: >2000 pg/mL — ABNORMAL HIGH (ref 200–1100)

## 2022-04-23 LAB — URINALYSIS, ROUTINE W REFLEX MICROSCOPIC
Bilirubin Urine: NEGATIVE
Glucose, UA: NEGATIVE
Hgb urine dipstick: NEGATIVE
Hyaline Cast: NONE SEEN /LPF
Ketones, ur: NEGATIVE
Nitrite: NEGATIVE
Protein, ur: NEGATIVE
Specific Gravity, Urine: 1.013 (ref 1.001–1.035)
pH: 5.5 (ref 5.0–8.0)

## 2022-04-23 LAB — MICROALBUMIN / CREATININE URINE RATIO
Creatinine, Urine: 116 mg/dL (ref 20–275)
Microalb Creat Ratio: 3 mcg/mg creat (ref <30)
Microalb, Ur: 0.4 mg/dL

## 2022-04-23 LAB — VITAMIN D 25 HYDROXY (VIT D DEFICIENCY, FRACTURES): Vit D, 25-Hydroxy: 53 ng/mL (ref 30–100)

## 2022-04-23 LAB — PTH, INTACT AND CALCIUM
Calcium: 10.5 mg/dL — ABNORMAL HIGH (ref 8.6–10.4)
PTH: 81 pg/mL — ABNORMAL HIGH (ref 16–77)

## 2022-04-23 LAB — CBC WITH DIFFERENTIAL/PLATELET
Absolute Monocytes: 252 cells/uL (ref 200–950)
Basophils Absolute: 21 cells/uL (ref 0–200)
Basophils Relative: 0.7 %
Eosinophils Absolute: 21 cells/uL (ref 15–500)
Eosinophils Relative: 0.7 %
HCT: 45.4 % — ABNORMAL HIGH (ref 35.0–45.0)
Hemoglobin: 15.3 g/dL (ref 11.7–15.5)
Lymphs Abs: 1035 cells/uL (ref 850–3900)
MCH: 31.2 pg (ref 27.0–33.0)
MCHC: 33.7 g/dL (ref 32.0–36.0)
MCV: 92.5 fL (ref 80.0–100.0)
MPV: 10.1 fL (ref 7.5–12.5)
Monocytes Relative: 8.4 %
Neutro Abs: 1671 cells/uL (ref 1500–7800)
Neutrophils Relative %: 55.7 %
Platelets: 176 10*3/uL (ref 140–400)
RBC: 4.91 10*6/uL (ref 3.80–5.10)
RDW: 11.8 % (ref 11.0–15.0)
Total Lymphocyte: 34.5 %
WBC: 3 10*3/uL — ABNORMAL LOW (ref 3.8–10.8)

## 2022-04-23 LAB — LIPID PANEL
Cholesterol: 171 mg/dL (ref <200)
HDL: 68 mg/dL (ref >=50)
LDL Cholesterol (Calc): 84 mg/dL (calc)
Non-HDL Cholesterol (Calc): 103 mg/dL (calc) (ref <130)
Total CHOL/HDL Ratio: 2.5 (calc) (ref <5.0)
Triglycerides: 96 mg/dL (ref <150)

## 2022-04-23 LAB — MICROSCOPIC MESSAGE

## 2022-04-23 LAB — INSULIN, RANDOM: Insulin: 47.9 u[IU]/mL — ABNORMAL HIGH

## 2022-04-23 LAB — TSH: TSH: 0.79 mIU/L (ref 0.40–4.50)

## 2022-04-23 LAB — MAGNESIUM: Magnesium: 2.1 mg/dL (ref 1.5–2.5)

## 2022-04-23 NOTE — Progress Notes (Signed)
<><><><><><><><><><><><><><><><><><><><><><><><><><><><><><><><><> <><><><><><><><><><><><><><><><><><><><><><><><><><><><><><><><><> -   Test results slightly outside the reference range are not unusual. If there is anything important, I will review this with you,  otherwise it is considered normal test values.  If you have further questions,  please do not hesitate to contact me at the office or via My Chart.  <><><><><><><><><><><><><><><><><><><><><><><><><><><><><><><><><> <><><><><><><><><><><><><><><><><><><><><><><><><><><><><><><><><>  -  Absolutely stop the B-Complex that has Biotin 1,000 in it      ! ! !    -  But do  Recommend take a sub-lingual form of Vitamin B12 tablet   1,000 to 5,000 mcg tab that you dissolve under your tongue                                                                    And only take 2 x /week , eg Mon  7 Thurs  - Can get [employer] - best price at [employer] or on [employer] <><><><><><><><><><><><><><><><><><><><><><><><><><><><><><><><><>  -  PTH hormone that regulates calcium balance is borderline elevated,                                                                                   So will continue to monitor closely.  <><><><><><><><><><><><><><><><><><><><><><><><><><><><><><><><><>  -  Kidney functions are still stable in Stage 3a.  ( Continue to monitor )  <><><><><><><><><><><><><><><><><><><><><><><><><><><><><><><><><>  -  A1c high Normal - Continue to monitor <><><><><><><><><><><><><><><><><><><><><><><><><><><><><><><><><>  -  Thyroid OK   -  Vit D - OK  <><><><><><><><><><><><><><><><><><><><><><><><><><><><><><><><><>  -  Total Chol = 171  - Great  <><><><><><><><><><><><><><><><><><><><><><><><><><><><><><><><><> <><><><><><><><><><><><><><><><><><><><><><><><><><><><><><><><><>

## 2022-05-28 ENCOUNTER — Other Ambulatory Visit: Payer: Self-pay | Admitting: Nurse Practitioner

## 2022-05-28 DIAGNOSIS — F0280 Dementia in other diseases classified elsewhere without behavioral disturbance: Secondary | ICD-10-CM

## 2022-07-08 ENCOUNTER — Other Ambulatory Visit: Payer: Self-pay | Admitting: Nurse Practitioner

## 2022-07-08 DIAGNOSIS — I10 Essential (primary) hypertension: Secondary | ICD-10-CM

## 2022-07-10 ENCOUNTER — Other Ambulatory Visit: Payer: Self-pay | Admitting: Nurse Practitioner

## 2022-07-10 DIAGNOSIS — I10 Essential (primary) hypertension: Secondary | ICD-10-CM

## 2022-07-25 NOTE — Progress Notes (Signed)
MEDICARE ANNUAL WELLNESS VISIT AND 3 MONTH FOLLOW UP  Assessment:    Encounter for Medicare annual wellness exam 1 year   Essential hypertension Continue current medications: Atenolol and Olmesartan 40 mg  Monitor blood pressure at home; call if consistently over 130/80 Continue DASH diet.   Reminder to go to the ER if any CP, SOB, nausea, dizziness, severe HA, changes vision/speech, left arm numbness and tingling and jaw pain. -     CBC with Differential/Platelet -     COMPLETE METABOLIC PANEL WITH GFR   Hyperlipidemia, mixed Continue medications: rosuvastatin 60m 1/2 tab daily Discussed dietary and exercise modifications Low fat diet - Lipid panel  Vascular dementia without behavioral disturbance (HCC) Taking namenda and aricept Appears to be worsening       Advised husband to follow up with neurology  Gastroesophageal reflux disease, esophagitis presence not specified Continue PPI/H2 blocker, diet discussed  Malignant neoplasm of upper-outer quadrant of left breast in female, estrogen receptor positive (HGary S/p double mastectomy- no further need for   Open-angle glaucoma of right eye, unspecified glaucoma stage, unspecified open-angle glaucoma type S/p surgery doing well  Mild malnutrition (HCC) Increase weight Monitor closely Improved with remeron   Abnormal glucose Recent A1Cs at goal Discussed diet/exercise, weight management  Defer A1C; check CMP   Body mass index (BMI) 21 Increase weight Monitor closely DOing well with remeron   Medication management Continued  Vitamin D deficiency Continue supplement  External Hemorrhoid Currently managed with increased fiber and water, proctofoam PRN  Hyperparathyroidism/Hypercalcemia Has been stable, will continue to monitor - PTH -CMP   Over 40 minutes of chart review, face to face exam, counseling, chart review and critical decision making was performed. Future Appointments  Date Time Provider  [place] 10[place]09/20[place]  3:00 PM [facility] DX DEXA 1 [facility]  04/25/2023 11:00 AM MUnk Pinto MD [facility] None  09/23/2023 11:00 AM WAlycia Rossetti NP [facility] None     Plan:   During the course of the visit the patient was educated and counseled about appropriate screening and preventive services including:   Pneumococcal vaccine  Prevnar 13 Influenza vaccine Td vaccine Screening electrocardiogram Bone densitometry screening Colorectal cancer screening Diabetes screening Glaucoma screening Nutrition counseling  Advanced directives: requested   Subjective:  KKAMEYA AYDTis a 77y.o. female who presents for Medicare Annual Wellness Visit and 3 month follow up.   She is accompanied by her husband, [name] today.  She has history of dementia and follows with Dr AJaynee Eaglesfor this in the past.  She is taking aricept and namenda.  She is alert and oriented x2.  She is pleasant with conversation. He manages meds and finances, driving. She has sitter while he is at work. Denies concerns today.   She has history of breast cancer (2011) s/p bilateral mastectomy and radiation complete Nov 2011.  She had neuropathy during treatments but has since resolved.  [facility] H[facility]lis Porta She is doing well and has routine eye exams.    BMI is Body mass index is 23.08 kg/m.  She had been walking 1 1/4 mile daily with husband. Doing indoor exercise video with the cold. Appetite improved recently.  Wt Readings from Last 3 Encounters:  07/26/22 143 lb (64.9 kg)  04/22/22 140 lb 9.6 oz (63.8 kg)  01/07/22 129 lb 6.4 oz (58.7 kg)   Her blood pressure has been controlled at home, currently on Atenolol  100 mg QD and Olmesartan 40 mg QD. Today their BP is BP: 124/70  BP Readings from Last 3 Encounters:  07/26/22 124/70  04/22/22 124/80  01/07/22 124/70  She does workout , gentle walking twice a week. She denies chest  pain, shortness of breath, dizziness.   She is on cholesterol medication (rosuvastatin 20 mg 1/2 tab daily) and denies myalgias. Her cholesterol is at goal. The cholesterol last visit was:   Lab Results  Component Value Date   CHOL 171 04/22/2022   HDL 68 04/22/2022   LDLCALC 84 04/22/2022   TRIG 96 04/22/2022   CHOLHDL 2.5 04/22/2022   She has a history of preDM but has decreased sugar and has increased water. Last A1C in the office was:  Lab Results  Component Value Date   HGBA1C 5.8 (H) 04/22/2022   She has history of CKD borderline II/III at baseline for many years, denies NSAIDs. Trying to increase fluid intake. Last GFR: Lab Results  Component Value Date   EGFR 52 (L) 04/22/2022   Hemorrhoid has not caused issues recently.  Has increased fiber and water intake   Patient is on Vitamin D supplement.   Lab Results  Component Value Date   VD25OH 53 04/22/2022        Medication Review: Current Outpatient Medications on File Prior to Visit  Medication Sig Dispense Refill   acetaminophen (TYLENOL) 500 MG tablet Take 500 mg by mouth every 6 (six) hours as needed.     aspirin 81 MG tablet Take 81 mg by mouth daily.      atenolol (TENORMIN) 100 MG tablet Take 1 tablet by mouth once daily for blood pressure 90 tablet 0   Cholecalciferol (VITAMIN D3) 1.25 MG (50000 UT) CAPS TAKE ONE CAPSULE THREE DAYS A WEEK FOR TWELVE WEEKS. (Patient taking differently: 2,000 Units daily.) 36 capsule 0   donepezil (ARICEPT) 10 MG tablet TAKE 1 TABLET BY MOUTH ONCE DAILY AT BEDTIME FOR  MEMORY 90 tablet 0   memantine (NAMENDA) 10 MG tablet Take  1 tablet  2 x /day  for Memory                                             /                           TAKE                                BY                                   MOUTH 180 tablet 3   mirtazapine (REMERON) 15 MG tablet TAKE 1 TABLET BY MOUTH AT BEDTIME AS NEEDED FOR SLEEP AND  APPETITE 90 tablet 0   olmesartan (BENICAR) 40 MG tablet TAKE 1  TABLET BY MOUTH AT BEDTIME FOR BLOOD PRESSURE 90 tablet 0   OVER THE COUNTER MEDICATION daily. B-Complex with 1000 mcg Biotin     rosuvastatin (CRESTOR) 20 MG tablet TAKE 1 TABLET BY MOUTH ONCE DAILY FOR CHOLESTEROL 90 tablet 1   No current facility-administered medications on file prior to visit.    Allergies  Allergen Reactions  Minocycline    Prednisone Swelling   Sulfa Antibiotics     Patient does not remember the type of reaction, happened years ago.   Zithromax [Azithromycin] Swelling   Ciprofloxacin Hcl Rash   Penicillins Rash    Current Problems (verified) Patient Active Problem List   Diagnosis Date Noted   History of primary hyperparathyroidism 12/04/2021   Stage 3a chronic kidney disease ([place]) 11/30/2020   History of breast cancer 11/30/2020   Mild malnutrition ([place]) 10/01/2018   Vascular dementia without behavioral disturbance ([place]) 06/22/2018   B12 deficiency 06/22/2018   Abnormal glucose 06/22/2018   Memory changes 10/27/2017   BMI 22.0-22.9, adult 05/08/2015   Open-angle glaucoma 05/08/2015   Medication management 08/30/2013   Vitamin D deficiency    GERD (gastroesophageal reflux disease)    Hyperlipidemia, mixed 10/18/2010   Essential hypertension 10/18/2010    Screening Tests Immunization History  Administered Date(s) Administered   Influenza, High Dose Seasonal PF 03/07/2014, 05/08/2015, 03/11/2017, 03/03/2018, 04/06/2019, 04/20/2021   PFIZER(Purple Top)SARS-COV-2 Vaccination 07/02/2019   PPD Test 08/30/2013, 10/04/2014, 10/24/2015   Pneumococcal Conjugate-13 03/24/2014   Pneumococcal Polysaccharide-23 09/08/2006, 03/11/2017   Tdap 08/27/2012   Zoster, Live 09/03/2012   Health Maintenance  Topic Date Due   INFLUENZA VACCINE  01/08/2022   COVID-19 Vaccine (2 - Pfizer risk series) 08/11/2022 (Originally 07/23/2019)   Zoster Vaccines- Shingrix (1 of 2) 10/24/2022 (Originally 10/11/1964)   DTaP/Tdap/Td (2 - Td or Tdap) 08/28/2022   COLONOSCOPY (Pts  45-34yr Insurance coverage will need to be confirmed)  03/30/2023   Medicare Annual Wellness (AWV)  07/27/2023   Pneumonia Vaccine 75 Years old  Completed   DEXA SCAN  Completed   Hepatitis C Screening  Completed   HPV VACCINES  Aged Out     Names of Other Physician/Practitioners you currently use: 1. [facility] here for primary care 2. [facility] eye doctor, last visit 11/2021 3. Dr. ALilian Comadentist, last visit Q6 months, last 04/2022  Patient Care Team: MUnk Pinto MD as PCP - General (Internal Medicine) MClarene Essex MD as Consulting Physician (Gastroenterology) KMeda Klinefelter MD as Consulting Physician (Endocrinology) SMordecai Rasmussen MD as Consulting Physician (Surgery)  SURGICAL HISTORY She  has a past surgical history that includes Mastectomy (August 2011); Parathyroidectomy; and Nasal sinus surgery. FAMILY HISTORY Her family history includes Alzheimer's disease in her mother; Cancer in her mother; Dementia in her maternal grandfather and maternal grandmother; Diabetes in her mother; Heart attack in her brother; Heart attack (age of onset: 426 in her father. SOCIAL HISTORY She  reports that she has never smoked. She has never used smokeless tobacco. She reports that she does not drink alcohol and does not use drugs.   MEDICARE WELLNESS OBJECTIVES: Physical activity: Current Exercise Habits: Home exercise routine, Type of exercise: stretching;calisthenics;walking, Time (Minutes): 30, Frequency (Times/Week): 3, Weekly Exercise (Minutes/Week): 90, Intensity: Mild, Exercise limited by: psychological condition(s) Cardiac risk factors: Cardiac Risk Factors include: advanced age (>573m, >6>49omen);dyslipidemia;hypertension Depression/mood screen:      07/26/2022   11:19 AM  Depression screen PHQ 2/9  Decreased Interest 0  Down, Depressed, Hopeless 0  PHQ - 2 Score 0    ADLs:     07/26/2022   11:19 AM 12/24/2021   11:58 AM   In your present state of health, do you have any difficulty performing the following activities:  Hearing? 0 0  Vision? 0 0  Difficulty concentrating or making decisions? 1 1  Walking or climbing stairs?  0 0  Dressing or bathing? 0 0  Doing errands, shopping? 1 1     Cognitive Testing  Alert? Yes  Normal Appearance?Yes  Oriented to person? Yes  Place? Yes   Time? No  Recall of three objects?  No,0/3  Can perform simple calculations? No  Displays appropriate judgment?No  Can read the correct time from a watch face?NO  EOL planning: Does Patient Have a Medical Advance Directive?: Yes Type of Advance Directive: [place] Does patient want to make changes to medical advance directive?: No - Patient declined Copy of [place] in Chart?: No - copy requested  Review of Systems  Constitutional: Negative.  Negative for malaise/fatigue and weight loss.  HENT: Negative.  Negative for hearing loss and tinnitus.   Eyes: Negative.  Negative for blurred vision and double vision.  Respiratory: Negative.  Negative for cough, sputum production, shortness of breath and wheezing.   Cardiovascular: Negative.  Negative for chest pain, palpitations, orthopnea, claudication, leg swelling and PND.  Gastrointestinal: Negative.  Negative for abdominal pain, blood in stool, constipation, diarrhea, heartburn, melena, nausea and vomiting.       Hemorrhoids  Genitourinary: Negative.   Musculoskeletal: Negative.  Negative for falls, joint pain and myalgias.  Skin: Negative.  Negative for rash.  Neurological: Negative.  Negative for dizziness, tingling, sensory change, weakness and headaches.  Endo/Heme/Allergies: Negative.  Negative for polydipsia.  Psychiatric/Behavioral:  Positive for memory loss (worsening). Negative for depression, substance abuse and suicidal ideas. The patient is not nervous/anxious and does not have insomnia.   All other systems reviewed and are  negative.    Objective:     Today's Vitals   07/26/22 1106  BP: 124/70  Pulse: 65  Resp: 16  Temp: 97.9 F (36.6 C)  SpO2: 95%  Weight: 143 lb (64.9 kg)  Height: 5' 6"$  (1.676 m)    Body mass index is 23.08 kg/m.  General appearance: alert, no distress, WD/WN, female HEENT: normocephalic, sclerae anicteric, TMs pearly, nares patent, no discharge or erythema, pharynx normal Oral cavity: MMM, no lesions Neck: supple, no lymphadenopathy, no thyromegaly, no masses Heart: RRR, normal S1, S2, no murmurs Lungs: CTA bilaterally, no wheezes, rhonchi, or rales Abdomen: +bs, soft, non tender, non distended, no masses, no hepatomegaly, no splenomegaly Musculoskeletal: nontender, no swelling, no obvious deformity Extremities: no edema, no cyanosis, no clubbing Pulses: 2+ symmetric, upper and lower extremities, normal cap refill Neurological: alert, oriented x 2, CN2-12 intact, strength normal upper extremities and lower extremities, sensation normal throughout, DTRs 2+ throughout, no cerebellar signs, gait normal Psychiatric: normal affect, behavior normal, pleasant . Oriented to person  Medicare Attestation I have personally reviewed: The patient's medical and social history Their use of alcohol, tobacco or illicit drugs Their current medications and supplements The patient's functional ability including ADLs,fall risks, home safety risks, cognitive, and hearing and visual impairment Diet and physical activities Evidence for depression or mood disorders  The patient's weight, height, BMI, and visual acuity have been recorded in the chart.  I have made referrals, counseling, and provided education to the patient based on review of the above and I have provided the patient with a written personalized care plan for preventive services.     Alycia Rossetti, NP   07/26/2022

## 2022-07-26 ENCOUNTER — Ambulatory Visit (INDEPENDENT_AMBULATORY_CARE_PROVIDER_SITE_OTHER): Payer: Medicare HMO | Admitting: Nurse Practitioner

## 2022-07-26 ENCOUNTER — Encounter: Payer: Self-pay | Admitting: Nurse Practitioner

## 2022-07-26 VITALS — BP 124/70 | HR 65 | Temp 97.9°F | Resp 16 | Ht 66.0 in | Wt 143.0 lb

## 2022-07-26 DIAGNOSIS — H4010X0 Unspecified open-angle glaucoma, stage unspecified: Secondary | ICD-10-CM

## 2022-07-26 DIAGNOSIS — R6889 Other general symptoms and signs: Secondary | ICD-10-CM | POA: Diagnosis not present

## 2022-07-26 DIAGNOSIS — N1831 Chronic kidney disease, stage 3a: Secondary | ICD-10-CM

## 2022-07-26 DIAGNOSIS — E559 Vitamin D deficiency, unspecified: Secondary | ICD-10-CM | POA: Diagnosis not present

## 2022-07-26 DIAGNOSIS — E782 Mixed hyperlipidemia: Secondary | ICD-10-CM

## 2022-07-26 DIAGNOSIS — Z79899 Other long term (current) drug therapy: Secondary | ICD-10-CM | POA: Diagnosis not present

## 2022-07-26 DIAGNOSIS — K219 Gastro-esophageal reflux disease without esophagitis: Secondary | ICD-10-CM

## 2022-07-26 DIAGNOSIS — Z6821 Body mass index (BMI) 21.0-21.9, adult: Secondary | ICD-10-CM

## 2022-07-26 DIAGNOSIS — R69 Illness, unspecified: Secondary | ICD-10-CM | POA: Diagnosis not present

## 2022-07-26 DIAGNOSIS — E210 Primary hyperparathyroidism: Secondary | ICD-10-CM

## 2022-07-26 DIAGNOSIS — I10 Essential (primary) hypertension: Secondary | ICD-10-CM

## 2022-07-26 DIAGNOSIS — R7309 Other abnormal glucose: Secondary | ICD-10-CM

## 2022-07-26 DIAGNOSIS — K644 Residual hemorrhoidal skin tags: Secondary | ICD-10-CM

## 2022-07-26 DIAGNOSIS — Z0001 Encounter for general adult medical examination with abnormal findings: Secondary | ICD-10-CM | POA: Diagnosis not present

## 2022-07-26 DIAGNOSIS — F0150 Vascular dementia without behavioral disturbance: Secondary | ICD-10-CM

## 2022-07-26 DIAGNOSIS — E441 Mild protein-calorie malnutrition: Secondary | ICD-10-CM | POA: Diagnosis not present

## 2022-07-26 DIAGNOSIS — C50412 Malignant neoplasm of upper-outer quadrant of left female breast: Secondary | ICD-10-CM

## 2022-07-26 DIAGNOSIS — Z0000 Encounter for general adult medical examination without abnormal findings: Secondary | ICD-10-CM

## 2022-07-26 NOTE — Patient Instructions (Signed)
Dr Monna Fam - ophthalmologist Schedule follow up appointment with neurology   [place]   Know what a healthy weight is for you (roughly BMI <25) and aim to maintain this   Aim for 7+ servings of fruits and vegetables daily   70-80+ fluid ounces of water or unsweet tea for healthy kidneys   Limit to max 1 drink of alcohol per day; avoid smoking/tobacco   Limit animal fats in diet for cholesterol and heart health - choose grass fed whenever available   Avoid highly processed foods, and foods high in saturated/trans fats   Aim for low stress - take time to unwind and care for your mental health   Aim for 150 min of moderate intensity exercise weekly for heart health, and weights twice weekly for bone health   Aim for 7-9 hours of sleep daily

## 2022-07-27 LAB — COMPLETE METABOLIC PANEL WITH GFR
AG Ratio: 1.4 (calc) (ref 1.0–2.5)
ALT: 14 U/L (ref 6–29)
AST: 19 U/L (ref 10–35)
Albumin: 4.2 g/dL (ref 3.6–5.1)
Alkaline phosphatase (APISO): 93 U/L (ref 37–153)
BUN/Creatinine Ratio: 11 (calc) (ref 6–22)
BUN: 14 mg/dL (ref 7–25)
CO2: 29 mmol/L (ref 20–32)
Calcium: 10.8 mg/dL — ABNORMAL HIGH (ref 8.6–10.4)
Chloride: 107 mmol/L (ref 98–110)
Creat: 1.29 mg/dL — ABNORMAL HIGH (ref 0.60–1.00)
Globulin: 2.9 g/dL (calc) (ref 1.9–3.7)
Glucose, Bld: 95 mg/dL (ref 65–99)
Potassium: 4.8 mmol/L (ref 3.5–5.3)
Sodium: 142 mmol/L (ref 135–146)
Total Bilirubin: 1.1 mg/dL (ref 0.2–1.2)
Total Protein: 7.1 g/dL (ref 6.1–8.1)
eGFR: 44 mL/min/{1.73_m2} — ABNORMAL LOW (ref >=60)

## 2022-07-27 LAB — CBC WITH DIFFERENTIAL/PLATELET
Absolute Monocytes: 252 cells/uL (ref 200–950)
Basophils Absolute: 22 cells/uL (ref 0–200)
Basophils Relative: 0.6 %
Eosinophils Absolute: 11 cells/uL — ABNORMAL LOW (ref 15–500)
Eosinophils Relative: 0.3 %
HCT: 43 % (ref 35.0–45.0)
Hemoglobin: 14.5 g/dL (ref 11.7–15.5)
Lymphs Abs: 1148 cells/uL (ref 850–3900)
MCH: 31.5 pg (ref 27.0–33.0)
MCHC: 33.7 g/dL (ref 32.0–36.0)
MCV: 93.3 fL (ref 80.0–100.0)
MPV: 10.1 fL (ref 7.5–12.5)
Monocytes Relative: 7 %
Neutro Abs: 2167 cells/uL (ref 1500–7800)
Neutrophils Relative %: 60.2 %
Platelets: 173 10*3/uL (ref 140–400)
RBC: 4.61 10*6/uL (ref 3.80–5.10)
RDW: 11.9 % (ref 11.0–15.0)
Total Lymphocyte: 31.9 %
WBC: 3.6 10*3/uL — ABNORMAL LOW (ref 3.8–10.8)

## 2022-07-27 LAB — LIPID PANEL
Cholesterol: 133 mg/dL (ref <200)
HDL: 57 mg/dL (ref >=50)
LDL Cholesterol (Calc): 61 mg/dL (calc)
Non-HDL Cholesterol (Calc): 76 mg/dL (calc) (ref <130)
Total CHOL/HDL Ratio: 2.3 (calc) (ref <5.0)
Triglycerides: 70 mg/dL (ref <150)

## 2022-07-27 LAB — PTH, INTACT AND CALCIUM
Calcium: 10.8 mg/dL — ABNORMAL HIGH (ref 8.6–10.4)
PTH: 82 pg/mL — ABNORMAL HIGH (ref 16–77)

## 2022-08-05 ENCOUNTER — Other Ambulatory Visit: Payer: Self-pay | Admitting: Nurse Practitioner

## 2022-09-16 ENCOUNTER — Telehealth: Payer: Self-pay | Admitting: Nurse Practitioner

## 2022-09-16 ENCOUNTER — Other Ambulatory Visit: Payer: Self-pay | Admitting: Nurse Practitioner

## 2022-09-16 DIAGNOSIS — F0150 Vascular dementia without behavioral disturbance: Secondary | ICD-10-CM

## 2022-09-16 NOTE — Telephone Encounter (Signed)
[facility], [place] [place] dropped off hospice referral form requested by patient's spouse. Per Anda Kraft, ordered hospice evaluation.

## 2022-09-17 NOTE — Telephone Encounter (Signed)
[facility] called needing verbal order to transfer pt to Hospice care, due to [place] up front not being able to give verbal orders. I was unable to help her, and told her a nurse or the doctor would contact her directly as they could not send a fax for orders. Her name is Shannon Nielsen [phone]

## 2022-10-09 ENCOUNTER — Ambulatory Visit
Admission: RE | Admit: 2022-10-09 | Discharge: 2022-10-09 | Disposition: A | Discharge status: Home / Self Care | Payer: Medicare HMO | Source: Ambulatory Visit | Attending: Internal Medicine | Admitting: Internal Medicine

## 2022-10-09 DIAGNOSIS — Z13820 Encounter for screening for osteoporosis: Secondary | ICD-10-CM

## 2022-10-09 NOTE — Progress Notes (Signed)
^<^<^<^<^<^<^<^<^<^<^<^<^<^<^<^<^<^<^<^<^<^<^<^<^<^<^<^<^<^<^<^<^<^<^<^<^ ^>^>^>^>^>^>^>^>^>^>^>>^>^>^>^>^>^>^>^>^>^>^>^>^>^>^>^>^>^>^>^>^>^>^>^>^>  -   Bone Density shows Osteopenia  Thinning of Bones  ( Not Osteoporosis ^<^<^<^<^<^<^<^<^<^<^<^<^<^<^<^<^<^<^<^<^<^<^<^<^<^<^<^<^<^<^<^<^<^<^<^<^ ^>^>^>^>^>^>^>^>^>^>^>^>^>^>^>^>^>^>^>^>^>^>^>^>^>^>^>^>^>^>^>^>^>^>^>^>^  - Recommend Exercise 20-30 minutes Daily,   - Take Vitamin K 2 (MK7) , Calcium 500-600 mg / daily &  Change your  Vitamin D to 2 capsules 5,000 u = 10,000 units EVERY day  !   ^>^>^>^>^>^>^>^>^>^>^>^>^>^>^>^>^>^>^>^>^>^>^>^>^>^>^>^>^>^>^>^>^>^>^>^>^  -

## 2022-10-31 ENCOUNTER — Other Ambulatory Visit: Payer: Self-pay | Admitting: Nurse Practitioner

## 2022-11-05 ENCOUNTER — Ambulatory Visit: Payer: Medicare HMO | Admitting: Nurse Practitioner

## 2023-02-17 ENCOUNTER — Other Ambulatory Visit: Payer: Self-pay | Admitting: Internal Medicine

## 2023-02-17 DIAGNOSIS — F0150 Vascular dementia without behavioral disturbance: Secondary | ICD-10-CM

## 2023-02-17 DIAGNOSIS — R413 Other amnesia: Secondary | ICD-10-CM

## 2023-03-05 ENCOUNTER — Encounter: Payer: Self-pay | Admitting: Nurse Practitioner

## 2023-03-05 ENCOUNTER — Ambulatory Visit (INDEPENDENT_AMBULATORY_CARE_PROVIDER_SITE_OTHER): Payer: Medicare HMO | Admitting: Nurse Practitioner

## 2023-03-05 VITALS — BP 100/62 | HR 83 | Temp 97.7°F | Ht 66.0 in | Wt 140.6 lb

## 2023-03-05 DIAGNOSIS — Z6821 Body mass index (BMI) 21.0-21.9, adult: Secondary | ICD-10-CM | POA: Diagnosis not present

## 2023-03-05 DIAGNOSIS — R7309 Other abnormal glucose: Secondary | ICD-10-CM

## 2023-03-05 DIAGNOSIS — E559 Vitamin D deficiency, unspecified: Secondary | ICD-10-CM | POA: Diagnosis not present

## 2023-03-05 DIAGNOSIS — Z111 Encounter for screening for respiratory tuberculosis: Secondary | ICD-10-CM

## 2023-03-05 DIAGNOSIS — K219 Gastro-esophageal reflux disease without esophagitis: Secondary | ICD-10-CM | POA: Diagnosis not present

## 2023-03-05 DIAGNOSIS — I10 Essential (primary) hypertension: Secondary | ICD-10-CM

## 2023-03-05 DIAGNOSIS — C50412 Malignant neoplasm of upper-outer quadrant of left female breast: Secondary | ICD-10-CM | POA: Diagnosis not present

## 2023-03-05 DIAGNOSIS — F0150 Vascular dementia without behavioral disturbance: Secondary | ICD-10-CM | POA: Diagnosis not present

## 2023-03-05 DIAGNOSIS — Z022 Encounter for examination for admission to residential institution: Secondary | ICD-10-CM

## 2023-03-05 DIAGNOSIS — Z79899 Other long term (current) drug therapy: Secondary | ICD-10-CM

## 2023-03-05 DIAGNOSIS — N1831 Chronic kidney disease, stage 3a: Secondary | ICD-10-CM | POA: Diagnosis not present

## 2023-03-05 DIAGNOSIS — E782 Mixed hyperlipidemia: Secondary | ICD-10-CM | POA: Diagnosis not present

## 2023-03-05 DIAGNOSIS — E441 Mild protein-calorie malnutrition: Secondary | ICD-10-CM | POA: Diagnosis not present

## 2023-03-05 DIAGNOSIS — S51811A Laceration without foreign body of right forearm, initial encounter: Secondary | ICD-10-CM

## 2023-03-05 DIAGNOSIS — E210 Primary hyperparathyroidism: Secondary | ICD-10-CM

## 2023-03-05 DIAGNOSIS — Z23 Encounter for immunization: Secondary | ICD-10-CM

## 2023-03-05 NOTE — Patient Instructions (Signed)

## 2023-03-05 NOTE — Progress Notes (Signed)
6 MONTH FOLLOW UP  Assessment:   Encounter for examination for admission to assisted living facility Completed FL2 form Reviewed MOST and DNR forms- took home to review Vaccinations updated Quantiferon Gold ordered  Essential hypertension Continue current medications:  Olmesartan 40 mg  Monitor blood pressure at home; call if consistently over 130/80 Continue DASH diet.   Reminder to go to the ER if any CP, SOB, nausea, dizziness, severe HA, changes vision/speech, left arm numbness and tingling and jaw pain. -     CBC with Differential/Platelet -     COMPLETE METABOLIC PANEL WITH GFR   Hyperlipidemia, mixed Continue medications: rosuvastatin 20mg  1/2 tab daily Discussed dietary and exercise modifications Low fat diet - Lipid  Vascular dementia without behavioral disturbance (HCC) Taking namenda and aricept Appears to be worsening       Advised husband to follow up with neurology  Gastroesophageal reflux disease, esophagitis presence not specified Continue PPI/H2 blocker, diet discussed  Malignant neoplasm of upper-outer quadrant of left breast in female, estrogen receptor positive (HCC) S/p double mastectomy- no further need for   Open-angle glaucoma of right eye, unspecified glaucoma stage, unspecified open-angle glaucoma type S/p surgery doing well  Mild malnutrition (HCC) Increase weight Monitor closely Improved with remeron   Abnormal glucose Recent A1Cs at goal Discussed diet/exercise, weight management  Check CMP   Flu vaccine need High dose flu vaccine given  Skin laceration right forearm Keep area clean and dry Monitor for signs of infection - Tdap given  Body mass index (BMI) 21 Continue to try to introduce nutrient dense foods and protein  Medication management Continued  Vitamin D deficiency Continue supplement  External Hemorrhoid Currently managed with increased fiber and water, proctofoam PRN  Hyperparathyroidism/Hypercalcemia Has  been stable, will continue to monitor - PTH -CMP  Screening Pulmonary TB - Quantiferon gold   Over 40 minutes of chart review, face to face exam, counseling, chart review and critical decision making was performed. Future Appointments  Date Time Provider Department Center  04/25/2023 11:00 AM Lucky Cowboy, MD [facility] None  09/23/2023 11:00 AM Raynelle Dick, NP [facility] None       Subjective:  Shannon Nielsen is a 77 y.o. female who presents for 6 month follow up. She is possibly going to go to assisted living at [facility]- needs assessment for FL2  She is accompanied by her husband [name] today as well as her caregiver.  She has history of dementia and follows with Dr Lucia Gaskins for this in the past.  She is taking aricept and namenda.  She is alert and oriented x 1.  She is pleasant with conversation, but speaks very little. He manages meds and finances, driving. She has been eating well if someone prepares her food. She is able to ambulate with assistance of 1 person occasionally but mostly independent.  She does have occasional episodes of stool and urine incontinence. She sleeps well.  No verbal or physical outbursts or wandering behaviors.   She has history of breast cancer (2011) s/p bilateral mastectomy and radiation complete Nov 2011.  She had neuropathy during treatments but has since resolved.  Had glaucoma and cataract surgery left eye at [facility] 09/29/2018 with Dr. Wynelle Fanny. She is doing well and has routine eye exams.    BMI is Body mass index is 22.69 kg/m.  She had been walking with caregiver when weather is nice.  Appetite improved recently.  Wt Readings from Last 3 Encounters:  03/05/23 140 lb 9.6 oz (63.8  kg)  07/26/22 143 lb (64.9 kg)  04/22/22 140 lb 9.6 oz (63.8 kg)   Her blood pressure has been controlled at home, currently on Olmesartan 40 mg QD. Today their BP is BP: 100/62  BP Readings from Last 3 Encounters:  03/05/23 100/62  07/26/22 124/70  04/22/22  124/80  She does workout , gentle walking twice a week. She denies chest pain, shortness of breath, dizziness.  She has small laceration noted of right wrist, non tender.  Uncertain how she obtained.    She is on cholesterol medication (rosuvastatin 20 mg 1/2 tab daily) and denies myalgias. Her cholesterol is at goal. The cholesterol last visit was:   Lab Results  Component Value Date   CHOL 133 07/26/2022   HDL 57 07/26/2022   LDLCALC 61 07/26/2022   TRIG 70 07/26/2022   CHOLHDL 2.3 07/26/2022   She has a history of preDM but has decreased sugar and has increased water. Last A1C in the office was:  Lab Results  Component Value Date   HGBA1C 5.8 (H) 04/22/2022   She has history of CKD borderline II/III at baseline for many years, denies NSAIDs. Trying to increase fluid intake. Last GFR: Lab Results  Component Value Date   EGFR 44 (L) 07/26/2022   Hemorrhoid has been bleeding recently- have not been using witch hazel wipes regularly.   Has increased fiber and water intake   Patient is on Vitamin D supplement.   Lab Results  Component Value Date   VD25OH 53 04/22/2022        Medication Review: Current Outpatient Medications on File Prior to Visit  Medication Sig Dispense Refill   aspirin 81 MG tablet Take 81 mg by mouth daily.      Cholecalciferol (VITAMIN D3) 1.25 MG (50000 UT) CAPS TAKE ONE CAPSULE THREE DAYS A WEEK FOR TWELVE WEEKS. (Patient taking differently: 2,000 Units daily.) 36 capsule 0   donepezil (ARICEPT) 10 MG tablet TAKE 1 TABLET BY MOUTH ONCE DAILY AT BEDTIME FOR  MEMORY 90 tablet 0   memantine (NAMENDA) 10 MG tablet TAKE 1 TABLET BY MOUTH TWICE DAILY FOR  MEMORY 180 tablet 0   olmesartan (BENICAR) 40 MG tablet Take  1 tablet at Bedtime for BP                                                       /                                                                  TAKE                                             BY                                         MOUTH 90  tablet 3   OVER THE  COUNTER MEDICATION daily. B-Complex with 1000 mcg Biotin     rosuvastatin (CRESTOR) 20 MG tablet TAKE 1 TABLET BY MOUTH ONCE DAILY FOR CHOLESTEROL 90 tablet 1   acetaminophen (TYLENOL) 500 MG tablet Take 500 mg by mouth every 6 (six) hours as needed. (Patient not taking: Reported on 03/05/2023)     atenolol (TENORMIN) 100 MG tablet Take 1 tablet by mouth once daily for blood pressure (Patient not taking: Reported on 03/05/2023) 90 tablet 0   mirtazapine (REMERON) 15 MG tablet TAKE 1 TABLET BY MOUTH AT BEDTIME AS NEEDED FOR SLEEP AND  APPETITE (Patient not taking: Reported on 03/05/2023) 90 tablet 0   No current facility-administered medications on file prior to visit.    Allergies  Allergen Reactions   Minocycline    Prednisone Swelling   Sulfa Antibiotics     Patient does not remember the type of reaction, happened years ago.   Zithromax [Azithromycin] Swelling   Ciprofloxacin Hcl Rash   Penicillins Rash    Current Problems (verified) Patient Active Problem List   Diagnosis Date Noted   History of primary hyperparathyroidism 12/04/2021   Stage 3a chronic kidney disease (HCC) 11/30/2020   History of breast cancer 11/30/2020   Mild malnutrition (HCC) 10/01/2018   Vascular dementia without behavioral disturbance (HCC) 06/22/2018   B12 deficiency 06/22/2018   Abnormal glucose 06/22/2018   Memory changes 10/27/2017   BMI 22.0-22.9, adult 05/08/2015   Open-angle glaucoma 05/08/2015   Medication management 08/30/2013   Vitamin D deficiency    GERD (gastroesophageal reflux disease)    Hyperlipidemia, mixed 10/18/2010   Essential hypertension 10/18/2010    Screening Tests Immunization History  Administered Date(s) Administered   Influenza, High Dose Seasonal PF 03/07/2014, 05/08/2015, 03/11/2017, 03/03/2018, 04/06/2019, 04/20/2021   PFIZER(Purple Top)SARS-COV-2 Vaccination 07/02/2019   PPD Test 08/30/2013, 10/04/2014, 10/24/2015   Pneumococcal Conjugate-13  03/24/2014   Pneumococcal Polysaccharide-23 09/08/2006, 03/11/2017   Tdap 08/27/2012   Zoster, Live 09/03/2012   Health Maintenance  Topic Date Due   Zoster Vaccines- Shingrix (1 of 2) 10/11/1966   COVID-19 Vaccine (2 - Pfizer risk series) 07/23/2019   DTaP/Tdap/Td (2 - Td or Tdap) 08/28/2022   INFLUENZA VACCINE  01/09/2023   Colonoscopy  03/30/2023   Medicare Annual Wellness (AWV)  07/27/2023   Pneumonia Vaccine 5+ Years old  Completed   DEXA SCAN  Completed   Hepatitis C Screening  Completed   HPV VACCINES  Aged Out     Names of Other Physician/Practitioners you currently use: 1. [facility] here for primary care 2. Emily Filbert, eye doctor, last visit 11/2021 3. Dr. Beola Cord dentist, last visit Q6 months, last 04/2022  Patient Care Team: Lucky Cowboy, MD as PCP - General (Internal Medicine) Vida Rigger, MD as Consulting Physician (Gastroenterology) Rema Jasmine, MD as Consulting Physician (Endocrinology) Marita Kansas, MD as Consulting Physician (Surgery)  SURGICAL HISTORY She  has a past surgical history that includes Mastectomy (August 2011); Parathyroidectomy; and Nasal sinus surgery. FAMILY HISTORY Her family history includes Alzheimer's disease in her mother; Cancer in her mother; Dementia in her maternal grandfather and maternal grandmother; Diabetes in her mother; Heart attack in her brother; Heart attack (age of onset: 28) in her father. SOCIAL HISTORY She  reports that she has never smoked. She has never used smokeless tobacco. She reports that she does not drink alcohol and does not use drugs.     Review of Systems  Constitutional:  Positive for malaise/fatigue. Negative for weight  loss.  HENT: Negative.  Negative for hearing loss and tinnitus.   Eyes: Negative.  Negative for blurred vision and double vision.  Respiratory: Negative.  Negative for cough, sputum production, shortness of breath and wheezing.    Cardiovascular: Negative.  Negative for chest pain, palpitations, orthopnea, claudication, leg swelling and PND.  Gastrointestinal: Negative.  Negative for abdominal pain, blood in stool, constipation, diarrhea, heartburn, melena, nausea and vomiting.       Hemorrhoids  Genitourinary: Negative.   Musculoskeletal: Negative.  Negative for falls, joint pain and myalgias.  Skin: Negative.  Negative for rash.       Right forearm skin laceration  Neurological: Negative.  Negative for dizziness, tingling, sensory change, weakness and headaches.  Endo/Heme/Allergies: Negative.  Negative for polydipsia.  Psychiatric/Behavioral:  Positive for memory loss (worsening). Negative for depression, substance abuse and suicidal ideas. The patient is not nervous/anxious and does not have insomnia.   All other systems reviewed and are negative.    Objective:     Today's Vitals   03/05/23 1038  BP: 100/62  Pulse: 83  Temp: 97.7 F (36.5 C)  SpO2: 99%  Weight: 140 lb 9.6 oz (63.8 kg)  Height: 5\' 6"  (1.676 m)     Body mass index is 22.69 kg/m.  General appearance: alert, no distress, WD/WN, female HEENT: normocephalic, sclerae anicteric, TMs pearly, nares patent, no discharge or erythema, pharynx normal Oral cavity: MMM, no lesions Neck: supple, no lymphadenopathy, no thyromegaly, no masses Heart: RRR, normal S1, S2, no murmurs Lungs: CTA bilaterally, no wheezes, rhonchi, or rales Abdomen: +bs, soft, non tender, non distended, no masses, no hepatomegaly, no splenomegaly Musculoskeletal: nontender, no swelling, no obvious deformity. Strength 4/5 of all extremities Extremities: no edema, no cyanosis, no clubbing Skin: Warm and dry. 1-2 cm healing laceration right forearm Pulses: 2+ symmetric, upper and lower extremities, normal cap refill Neurological: alert, oriented x 1, CN2-12 intact, strength normal upper extremities and lower extremities, sensation normal throughout, DTRs 2+ throughout, no  cerebellar signs, gait slow and steady Psychiatric: flat affect, behavior normal, pleasant . Oriented to person  .     Raynelle Dick, NP   03/05/2023

## 2023-03-06 LAB — COMPLETE METABOLIC PANEL WITH GFR
AG Ratio: 1.6 (calc) (ref 1.0–2.5)
ALT: 15 U/L (ref 6–29)
AST: 20 U/L (ref 10–35)
Albumin: 4.3 g/dL (ref 3.6–5.1)
Alkaline phosphatase (APISO): 95 U/L (ref 37–153)
BUN/Creatinine Ratio: 13 (calc) (ref 6–22)
BUN: 16 mg/dL (ref 7–25)
CO2: 29 mmol/L (ref 20–32)
Calcium: 10.7 mg/dL — ABNORMAL HIGH (ref 8.6–10.4)
Chloride: 105 mmol/L (ref 98–110)
Creat: 1.23 mg/dL — ABNORMAL HIGH (ref 0.60–1.00)
Globulin: 2.7 g/dL (calc) (ref 1.9–3.7)
Glucose, Bld: 95 mg/dL (ref 65–99)
Potassium: 4.2 mmol/L (ref 3.5–5.3)
Sodium: 143 mmol/L (ref 135–146)
Total Bilirubin: 0.7 mg/dL (ref 0.2–1.2)
Total Protein: 7 g/dL (ref 6.1–8.1)
eGFR: 46 mL/min/{1.73_m2} — ABNORMAL LOW (ref >=60)

## 2023-03-06 LAB — LIPID PANEL
Cholesterol: 165 mg/dL (ref <200)
HDL: 66 mg/dL (ref >=50)
LDL Cholesterol (Calc): 84 mg/dL (calc)
Non-HDL Cholesterol (Calc): 99 mg/dL (calc) (ref <130)
Total CHOL/HDL Ratio: 2.5 (calc) (ref <5.0)
Triglycerides: 69 mg/dL (ref <150)

## 2023-03-06 LAB — CBC WITH DIFFERENTIAL/PLATELET
Absolute Monocytes: 269 cells/uL (ref 200–950)
Basophils Absolute: 21 cells/uL (ref 0–200)
Basophils Relative: 0.5 %
Eosinophils Absolute: 21 cells/uL (ref 15–500)
Eosinophils Relative: 0.5 %
HCT: 44.1 % (ref 35.0–45.0)
Hemoglobin: 14.6 g/dL (ref 11.7–15.5)
Lymphs Abs: 836 cells/uL — ABNORMAL LOW (ref 850–3900)
MCH: 31.1 pg (ref 27.0–33.0)
MCHC: 33.1 g/dL (ref 32.0–36.0)
MCV: 94 fL (ref 80.0–100.0)
MPV: 9.7 fL (ref 7.5–12.5)
Monocytes Relative: 6.4 %
Neutro Abs: 3053 cells/uL (ref 1500–7800)
Neutrophils Relative %: 72.7 %
Platelets: 185 10*3/uL (ref 140–400)
RBC: 4.69 10*6/uL (ref 3.80–5.10)
RDW: 11.7 % (ref 11.0–15.0)
Total Lymphocyte: 19.9 %
WBC: 4.2 10*3/uL (ref 3.8–10.8)

## 2023-03-06 LAB — PTH, INTACT AND CALCIUM
Calcium: 10.7 mg/dL — ABNORMAL HIGH (ref 8.6–10.4)
PTH: 84 pg/mL — ABNORMAL HIGH (ref 16–77)

## 2023-03-06 LAB — TSH: TSH: 0.92 mIU/L (ref 0.40–4.50)

## 2023-03-08 LAB — QUANTIFERON-TB GOLD PLUS
Mitogen-NIL: >10 [IU]/mL
NIL: 0.03 [IU]/mL
QuantiFERON-TB Gold Plus: NEGATIVE
TB1-NIL: 0.01 [IU]/mL
TB2-NIL: 0 [IU]/mL

## 2023-03-26 DIAGNOSIS — Z008 Encounter for other general examination: Secondary | ICD-10-CM | POA: Diagnosis not present

## 2023-04-14 ENCOUNTER — Other Ambulatory Visit: Payer: Self-pay | Admitting: Nurse Practitioner

## 2023-04-14 ENCOUNTER — Telehealth: Payer: Self-pay | Admitting: Nurse Practitioner

## 2023-04-14 DIAGNOSIS — F0280 Dementia in other diseases classified elsewhere without behavioral disturbance: Secondary | ICD-10-CM

## 2023-04-14 MED ORDER — DONEPEZIL HCL 10 MG PO TABS
ORAL_TABLET | ORAL | 2 refills | Status: AC
Start: 2023-04-14 — End: ?

## 2023-04-14 NOTE — Telephone Encounter (Signed)
Patient's husband is requesting a refill on Generic Aricept to [facility] on [address].

## 2023-04-24 ENCOUNTER — Encounter: Payer: Self-pay | Admitting: Internal Medicine

## 2023-04-24 NOTE — Patient Instructions (Signed)

## 2023-04-24 NOTE — Progress Notes (Signed)
Annual Screening/Preventative Visit & Comprehensive Evaluation &  Examination   Future Appointments  Date Time Provider Department  04/25/2023                    cpe 11:00 AM Lucky Cowboy, MD [facility]  09/23/2023                     wellness 11:00 AM Raynelle Dick, NP [facility]  05/05/2024                    cpe 11:00 AM Lucky Cowboy, MD [facility]        This very nice 77 y.o. MBF with  HTN, HLD, T2_NIDDM  Prediabetes  and Vitamin D Deficiency presents for a Screening /Preventative Visit & comprehensive evaluation and management of multiple medical co-morbidities. Patient has GERD controlled with diet. Patient had excision of a parathyroid adenoma in 2001 for primary hyperparathyroidism.                                                     Patient also has Vascular Dementia & is on Aricept/Namenda followed by Dr Lucia Gaskins .  Patient is capable of ADLs /personal hygiene, but requires 24 hr moderate supervision & guidance per spouse or sitter /companion            HTN predates since 68. Patient's BP has been controlled at home. Patient has CKD3a  ( GFR 48 ) consequent of her HTCVD.  and patient denies any cardiac symptoms as chest pain, palpitations, shortness of breath, dizziness or ankle swelling. Today's BP  is  at goal -  118/70.         Patient's hyperlipidemia is controlled with diet and Rosuvastatin. Patient denies myalgias or other medication SE's. Last lipids were at goal :  Lab Results  Component Value Date   CHOL 165 03/05/2023   HDL 66 03/05/2023   LDLCALC 84 03/05/2023   TRIG 69 03/05/2023   CHOLHDL 2.5 03/05/2023         Patient has hx/o prediabetes (A1c 6.0% /2011) and patient denies reactive hypoglycemic symptoms, visual blurring, diabetic polys or paresthesias. Last A1c was normal & at goal :  Lab Results  Component Value Date   HGBA1C 5.8 (H) 04/22/2022         Finally, patient has history of Vitamin D Deficiency ("13" /2008) and last  Vitamin D was at goal :  Lab Results  Component Value Date   VD25OH 53 04/22/2022       Current Outpatient Medications  Medication Instructions   Aspirin   81 mg , Oral, Daily    VITAMIN D 50,000 u TAKE ONE CAPSULE THREE DAYS    donepezil 10 MG tablet TAKE 1 TABLET DAILY    memantine 10 MG tablet TAKE 1 TABLET  TWICE DAILY    Olmesartan 40 MG tablet Take  1 tablet at Bedtime    B-Complex with 1000 mcg Biotin Daily,     rosuvastatin (CRESTOR) 20 MG tablet TAKE 1 TABLET BY MOUTH ONCE DAILY FOR CHOLESTEROL     Allergies  Allergen Reactions   Minocycline    Prednisone Swelling   Sulfa Antibiotics     Patient does not remember the type of reaction, happened years ago.   Zithromax [Azithromycin] Swelling  Ciprofloxacin Hcl Rash   Penicillins Rash     Past Medical History:  Diagnosis Date   Breast cancer (HCC)    Breast cancer of upper-outer quadrant of left female breast (HCC) 07/08/2011   Chest tightness    GERD (gastroesophageal reflux disease)    Hepatitis C virus screening Negative 08/31/2012 03/08/2018   Hyperlipidemia    Hyperparathyroidism, primary (HCC) 08/12/2012   Hypertension    Palpitations    Prediabetes    Vitamin D deficiency      Health Maintenance  Topic Date Due   Zoster Vaccines- Shingrix (1 of 2) Never done   COVID-19 Vaccine (2 - Pfizer ) 07/23/2019   INFLUENZA VACCINE  01/08/2021   TETANUS/TDAP  08/28/2022   COLONOSCOPY  03/30/2023   Pneumonia Vaccine 15+ Years old  Completed   DEXA SCAN  Completed   Hepatitis C Screening  Completed   HPV VACCINES  Aged Out     Immunization History  Administered Date(s) Administered   Influenza, High Dose  03/03/2018, 04/06/2019   PFIZER SARS-COV-2 Vacc 07/02/2019   PPD Test 08/30/2013, 10/04/2014, 10/24/2015   Pneumococcal -13 03/24/2014   Pneumococcal -23 09/08/2006, 03/11/2017   Tdap 08/27/2012   Zoster, Live 09/03/2012    Last Colon -  04/02/2013 - Dr Ewing Schlein - recc 5 yr F/U - overdue   Cologard  - 04/26/2021- Negative - Recc f/u 3 year Nov 2025   Last MGM - Last 2015 - s/p Bilat Mastectomies 2011  Last BMD - 01/06/2020  Past Surgical History:  Procedure Laterality Date   MASTECTOMY  August 2011   Bilateral   NASAL SINUS SURGERY     PARATHYROIDECTOMY       Family History  Problem Relation Age of Onset   Diabetes Mother    Cancer Mother        breast   Alzheimer's disease Mother    Heart attack Father 77   Heart attack Brother    Dementia Maternal Grandmother    Dementia Maternal Grandfather      Social History   Tobacco Use   Smoking status: Never   Smokeless tobacco: Never  Vaping Use   Vaping Use: Never used  Substance Use Topics   Alcohol use: No   Drug use: Never      ROS Constitutional: Denies fever, chills, weight loss/gain, headaches, insomnia,  night sweats, and change in appetite. Does c/o fatigue. Eyes: Denies redness, blurred vision, diplopia, discharge, itchy, watery eyes.  ENT: Denies discharge, congestion, post nasal drip, epistaxis, sore throat, earache, hearing loss, dental pain, Tinnitus, Vertigo, Sinus pain, snoring.  Cardio: Denies chest pain, palpitations, irregular heartbeat, syncope, dyspnea, diaphoresis, orthopnea, PND, claudication, edema Respiratory: denies cough, dyspnea, DOE, pleurisy, hoarseness, laryngitis, wheezing.  Gastrointestinal: Denies dysphagia, heartburn, reflux, water brash, pain, cramps, nausea, vomiting, bloating, diarrhea, constipation, hematemesis, melena, hematochezia, jaundice, hemorrhoids Genitourinary: Denies dysuria, frequency, urgency, nocturia, hesitancy, discharge, hematuria, flank pain Breast: Breast lumps, nipple discharge, bleeding.  Musculoskeletal: Denies arthralgia, myalgia, stiffness, Jt. Swelling, pain, limp, and strain/sprain. Denies falls. Skin: Denies puritis, rash, hives, warts, acne, eczema, changing in skin lesion Neuro: No weakness, tremor, incoordination, spasms, paresthesia,  pain Psychiatric: Denies confusion, memory loss, sensory loss. Denies Depression. Endocrine: Denies change in weight, skin, hair change, nocturia, and paresthesia, diabetic polys, visual blurring, hyper / hypo glycemic episodes.  Heme/Lymph: No excessive bleeding, bruising, enlarged lymph nodes.  Physical Exam  BP 118/70   Pulse 80   Temp 98 F (36.7 C)   Resp 16  Ht 5\' 6"  (1.676 m)   Wt 134 lb (60.8 kg)   SpO2 98%   BMI 21.63 kg/m   General Appearance: Very thin habitus and in no apparent distress.  Eyes: PERRLA, EOMs, conjunctiva no swelling or erythema, normal fundi and vessels. Sinuses: No frontal/maxillary tenderness ENT/Mouth: EACs patent / TMs  nl. Nares clear without erythema, swelling, mucoid exudates. Oral hygiene is good. No erythema, swelling, or exudate. Tongue normal, non-obstructing. Tonsils not swollen or erythematous. Hearing normal.  Neck: Supple, thyroid not palpable. No bruits, nodes or JVD. Respiratory: Respiratory effort normal.  BS equal and clear bilateral without rales, rhonci, wheezing or stridor. Cardio: Heart sounds are normal with regular rate and rhythm and no murmurs, rubs or gallops. Peripheral pulses are normal and equal bilaterally without edema. No aortic or femoral bruits. Chest: symmetric with normal excursions and percussion. Breasts: deferred to [facility] ( husband to schedule )  Abdomen: Flat, soft with bowel sounds active. Nontender, no guarding, rebound, hernias, masses, or organomegaly.  Rectal area:  finds an inflamed external hemorrhoid Lymphatics: Non tender without lymphadenopathy.  Musculoskeletal: Full ROM all peripheral extremities, joint stability, 5/5 strength, and normal gait. Skin: Warm and dry without rashes, lesions, cyanosis, clubbing or  ecchymosis.  Neuro: Cranial nerves intact, reflexes equal bilaterally. (+) Snout & palmo-mental reflexes.  Normal muscle tone, no cerebellar symptoms. Sensation intact.  Pysch: Alert and oriented   x 1-2, pleasant  affect, Insight and Judgment very limited     Assessment and Plan   1. Annual Preventative Screening Examination   2. Essential hypertension  - EKG 12-Lead - Urinalysis, Routine w reflex microscopic - Microalbumin / creatinine urine ratio - CBC with Differential/Platelet - COMPLETE METABOLIC PANEL WITH GFR - Magnesium - TSH   3. Hyperlipidemia, mixed  - EKG 12-Lead - Lipid panel - TSH   4. Vitamin D deficiency  - VITAMIN D 25 Hydroxy    5. Abnormal glucose  - Hemoglobin A1c - Insulin, random   6. Vascular dementia without behavioral disturbance (HCC)  - Vitamin B12 - Lipid panel - TSH   7. Stage 3a chronic kidney disease (HCC)  - Urinalysis, Routine w reflex microscopic - Microalbumin / creatinine urine ratio - Parathyroid hormone, intact (no Ca)   8. Hyperparathyroidism, primary (HCC)  - Parathyroid hormone, intact (no Ca)   9. B12 deficiency  - Vitamin B12   10. Screening for heart disease  - EKG 12-Lead   11. Mild malnutrition (HCC)  - COMPLETE METABOLIC PANEL WITH GFR   12. FHx: heart disease  - EKG 12-Lead   13. History of breast cancer   14. Screening for colorectal cancer  - POC Hemoccult Bld/Stl    15. Medication management  - Urinalysis, Routine w reflex microscopic - Microalbumin / creatinine urine ratio - Vitamin B12 - CBC with Differential/Platelet - COMPLETE METABOLIC PANEL WITH GFR - Magnesium - Lipid panel - TSH - Hemoglobin A1c - Insulin, random - VITAMIN D 25 Hydroxy         Patient was counseled in prudent diet to achieve/maintain BMI less than 25 for weight control, BP monitoring, regular exercise and medications. Discussed med's effects and SE's. Screening labs and tests as requested with regular follow-up as recommended. Over 40 minutes of exam, counseling, chart review and high complex critical decision making was performed.   Marinus Maw, MD

## 2023-04-25 ENCOUNTER — Encounter: Payer: Self-pay | Admitting: Internal Medicine

## 2023-04-25 ENCOUNTER — Ambulatory Visit (INDEPENDENT_AMBULATORY_CARE_PROVIDER_SITE_OTHER): Payer: Medicare HMO | Admitting: Internal Medicine

## 2023-04-25 VITALS — BP 118/70 | HR 80 | Temp 98.0°F | Resp 16 | Ht 66.0 in | Wt 134.0 lb

## 2023-04-25 DIAGNOSIS — E559 Vitamin D deficiency, unspecified: Secondary | ICD-10-CM

## 2023-04-25 DIAGNOSIS — Z136 Encounter for screening for cardiovascular disorders: Secondary | ICD-10-CM | POA: Diagnosis not present

## 2023-04-25 DIAGNOSIS — R7309 Other abnormal glucose: Secondary | ICD-10-CM

## 2023-04-25 DIAGNOSIS — Z853 Personal history of malignant neoplasm of breast: Secondary | ICD-10-CM

## 2023-04-25 DIAGNOSIS — Z0000 Encounter for general adult medical examination without abnormal findings: Secondary | ICD-10-CM

## 2023-04-25 DIAGNOSIS — E782 Mixed hyperlipidemia: Secondary | ICD-10-CM

## 2023-04-25 DIAGNOSIS — E538 Deficiency of other specified B group vitamins: Secondary | ICD-10-CM

## 2023-04-25 DIAGNOSIS — I10 Essential (primary) hypertension: Secondary | ICD-10-CM

## 2023-04-25 DIAGNOSIS — Z79899 Other long term (current) drug therapy: Secondary | ICD-10-CM

## 2023-04-25 DIAGNOSIS — F0150 Vascular dementia without behavioral disturbance: Secondary | ICD-10-CM

## 2023-04-25 DIAGNOSIS — Z0001 Encounter for general adult medical examination with abnormal findings: Secondary | ICD-10-CM

## 2023-04-25 DIAGNOSIS — N1831 Chronic kidney disease, stage 3a: Secondary | ICD-10-CM

## 2023-04-25 DIAGNOSIS — E210 Primary hyperparathyroidism: Secondary | ICD-10-CM

## 2023-04-25 DIAGNOSIS — Z8249 Family history of ischemic heart disease and other diseases of the circulatory system: Secondary | ICD-10-CM

## 2023-04-25 DIAGNOSIS — E441 Mild protein-calorie malnutrition: Secondary | ICD-10-CM

## 2023-04-25 DIAGNOSIS — Z1211 Encounter for screening for malignant neoplasm of colon: Secondary | ICD-10-CM

## 2023-04-26 ENCOUNTER — Other Ambulatory Visit: Payer: Self-pay | Admitting: Internal Medicine

## 2023-04-26 DIAGNOSIS — E210 Primary hyperparathyroidism: Secondary | ICD-10-CM

## 2023-04-26 NOTE — Progress Notes (Signed)
A1c = 5.8% - Borderline high sugars - Avoid sweets.  <>*<>*<>*<>*<>*<>*<>*<>*<>*<>*<>*<>*<>*<>*<>*<>*<>*<>*<>*<>*<>*<>*<>*<>*<> <>*<>*<>*<>*<>*<>*<>*<>*<>*<>*<>*<>*<>*<>*<>*<>*<>*<>*<>*<>*<>*<>*<>*<>*<>  -  Chol = 152 - Low  - Excellent   <>*<>*<>*<>*<>*<>*<>*<>*<>*<>*<>*<>*<>*<>*<>*<>*<>*<>*<>*<>*<>*<>*<>*<>*<> <>*<>*<>*<>*<>*<>*<>*<>*<>*<>*<>*<>*<>*<>*<>*<>*<>*<>*<>*<>*<>*<>*<>*<>*<>  -  Vitamin D is OK   <>*<>*<>*<>*<>*<>*<>*<>*<>*<>*<>*<>*<>*<>*<>*<>*<>*<>*<>*<>*<>*<>*<>*<>*<> <>*<>*<>*<>*<>*<>*<>*<>*<>*<>*<>*<>*<>*<>*<>*<>*<>*<>*<>*<>*<>*<>*<>*<>*<>  -  PTH level is still slightly elevated as is  serum calcium, So   have ordered repeat Ultrasound at the [facility]   <>*<>*<>*<>*<>*<>*<>*<>*<>*<>*<>*<>*<>*<>*<>*<>*<>*<>*<>*<>*<>*<>*<>*<>*<> <>*<>*<>*<>*<>*<>*<>*<>*<>*<>*<>*<>*<>*<>*<>*<>*<>*<>*<>*<>*<>*<>*<>*<>*<>

## 2023-04-28 LAB — URINALYSIS, ROUTINE W REFLEX MICROSCOPIC
Bilirubin Urine: NEGATIVE
Glucose, UA: NEGATIVE
Hyaline Cast: NONE SEEN /[LPF]
Ketones, ur: NEGATIVE
Nitrite: NEGATIVE
Protein, ur: NEGATIVE
Specific Gravity, Urine: 1.016 (ref 1.001–1.035)
pH: 5.5 (ref 5.0–8.0)

## 2023-04-28 LAB — VITAMIN B12: Vitamin B-12: >2000 pg/mL — ABNORMAL HIGH (ref 200–1100)

## 2023-04-28 LAB — CBC WITH DIFFERENTIAL/PLATELET
Absolute Lymphocytes: 1092 {cells}/uL (ref 850–3900)
Absolute Monocytes: 311 {cells}/uL (ref 200–950)
Basophils Absolute: 19 {cells}/uL (ref 0–200)
Basophils Relative: 0.5 %
Eosinophils Absolute: 19 {cells}/uL (ref 15–500)
Eosinophils Relative: 0.5 %
HCT: 43.6 % (ref 35.0–45.0)
Hemoglobin: 14.4 g/dL (ref 11.7–15.5)
MCH: 30.9 pg (ref 27.0–33.0)
MCHC: 33 g/dL (ref 32.0–36.0)
MCV: 93.6 fL (ref 80.0–100.0)
MPV: 10.1 fL (ref 7.5–12.5)
Monocytes Relative: 8.4 %
Neutro Abs: 2261 {cells}/uL (ref 1500–7800)
Neutrophils Relative %: 61.1 %
Platelets: 205 10*3/uL (ref 140–400)
RBC: 4.66 10*6/uL (ref 3.80–5.10)
RDW: 12 % (ref 11.0–15.0)
Total Lymphocyte: 29.5 %
WBC: 3.7 10*3/uL — ABNORMAL LOW (ref 3.8–10.8)

## 2023-04-28 LAB — INSULIN, RANDOM: Insulin: 13.7 u[IU]/mL

## 2023-04-28 LAB — COMPLETE METABOLIC PANEL WITH GFR
AG Ratio: 1.6 (calc) (ref 1.0–2.5)
ALT: 17 U/L (ref 6–29)
AST: 20 U/L (ref 10–35)
Albumin: 4.3 g/dL (ref 3.6–5.1)
Alkaline phosphatase (APISO): 116 U/L (ref 37–153)
BUN/Creatinine Ratio: 10 (calc) (ref 6–22)
BUN: 13 mg/dL (ref 7–25)
CO2: 29 mmol/L (ref 20–32)
Calcium: 10.7 mg/dL — ABNORMAL HIGH (ref 8.6–10.4)
Chloride: 105 mmol/L (ref 98–110)
Creat: 1.28 mg/dL — ABNORMAL HIGH (ref 0.60–1.00)
Globulin: 2.7 g/dL (ref 1.9–3.7)
Glucose, Bld: 101 mg/dL — ABNORMAL HIGH (ref 65–99)
Potassium: 3.9 mmol/L (ref 3.5–5.3)
Sodium: 141 mmol/L (ref 135–146)
Total Bilirubin: 1 mg/dL (ref 0.2–1.2)
Total Protein: 7 g/dL (ref 6.1–8.1)
eGFR: 44 mL/min/{1.73_m2} — ABNORMAL LOW (ref >=60)

## 2023-04-28 LAB — LIPID PANEL
Cholesterol: 152 mg/dL (ref <200)
HDL: 59 mg/dL (ref >=50)
LDL Cholesterol (Calc): 78 mg/dL
Non-HDL Cholesterol (Calc): 93 mg/dL (ref <130)
Total CHOL/HDL Ratio: 2.6 (calc) (ref <5.0)
Triglycerides: 71 mg/dL (ref <150)

## 2023-04-28 LAB — MICROALBUMIN / CREATININE URINE RATIO
Creatinine, Urine: 183 mg/dL (ref 20–275)
Microalb Creat Ratio: 5 mg/g{creat} (ref <30)
Microalb, Ur: 1 mg/dL

## 2023-04-28 LAB — HEMOGLOBIN A1C
Hgb A1c MFr Bld: 5.8 %{Hb} — ABNORMAL HIGH (ref <5.7)
Mean Plasma Glucose: 120 mg/dL
eAG (mmol/L): 6.6 mmol/L

## 2023-04-28 LAB — VITAMIN D 25 HYDROXY (VIT D DEFICIENCY, FRACTURES): Vit D, 25-Hydroxy: 98 ng/mL (ref 30–100)

## 2023-04-28 LAB — MAGNESIUM: Magnesium: 2 mg/dL (ref 1.5–2.5)

## 2023-04-28 LAB — TSH: TSH: 0.77 m[IU]/L (ref 0.40–4.50)

## 2023-04-28 LAB — PARATHYROID HORMONE, INTACT (NO CA): PTH: 87 pg/mL — ABNORMAL HIGH (ref 16–77)

## 2023-05-02 ENCOUNTER — Telehealth: Payer: Self-pay | Admitting: Internal Medicine

## 2023-05-02 NOTE — Telephone Encounter (Signed)
Spouse returned call, lab results given and scheduling US Soft tissue discussed. Agrees to have patient complete US Soft Tissue, prefers [facility].

## 2023-05-03 ENCOUNTER — Other Ambulatory Visit: Payer: Self-pay | Admitting: Nurse Practitioner

## 2023-05-03 DIAGNOSIS — F0150 Vascular dementia without behavioral disturbance: Secondary | ICD-10-CM

## 2023-05-03 DIAGNOSIS — R413 Other amnesia: Secondary | ICD-10-CM

## 2023-05-14 ENCOUNTER — Ambulatory Visit (HOSPITAL_COMMUNITY)
Admission: RE | Admit: 2023-05-14 | Discharge: 2023-05-14 | Disposition: A | Discharge status: Home / Self Care | Source: Ambulatory Visit | Attending: Internal Medicine | Admitting: Internal Medicine

## 2023-05-14 DIAGNOSIS — E210 Primary hyperparathyroidism: Secondary | ICD-10-CM | POA: Diagnosis present

## 2023-05-15 NOTE — Progress Notes (Signed)
[][][][][][][][][][][][][][][][][][][][][][][][][][][][][][][][][][][][][][][][][]][][][][][][][][][][][][][][][][][][][][][][][[][][][][] [][][][][][][][][][][][][][][][][][][][][][][][][][][][][][][][][][][][][][][][][]][][][][][][][][][][][][][][][][][][][][][][][[][][][][]  -   Thyroid Ultrasound does show mild enlargement of the thyroid gland  &   since thyroid levels are Normal - OK - Nothing to do..   - Also, the reason for doing the Ultrasound was                                                                   to look for an enlarged "para-thyroid" gland  And No abnormal thyroid enlargement or tumor was found - [place] !                                       Will continue to monitor serum calcium  & PTH levels closely  [] [] [] [] [] [] [] [] [] [] [] [] [] [] [] [] [] [] [] [] [] [] [] [] [] [] [] [] [] [] [] [] [] [] [] [] [] [] [] [] [] ][] [] [] [] [] [] [] [] [] [] [] [] [] [] [] [] [] [] [] [] [] [] [[] [] [] [] []  [] [] [] [] [] [] [] [] [] [] [] [] [] [] [] [] [] [] [] [] [] [] [] [] [] [] [] [] [] [] [] [] [] [] [] [] [] [] [] [] [] ][] [] [] [] [] [] [] [] [] [] [] [] [] [] [] [] [] [] [] [] [] [] [[] [] [] [] []   -

## 2023-06-04 ENCOUNTER — Emergency Department (HOSPITAL_COMMUNITY)

## 2023-06-04 ENCOUNTER — Emergency Department (HOSPITAL_COMMUNITY)
Admission: EM | Admit: 2023-06-04 | Discharge: 2023-06-05 | Disposition: A | Discharge status: Home / Self Care | Attending: Emergency Medicine | Admitting: Emergency Medicine

## 2023-06-04 ENCOUNTER — Encounter (HOSPITAL_COMMUNITY): Payer: Self-pay

## 2023-06-04 DIAGNOSIS — I10 Essential (primary) hypertension: Secondary | ICD-10-CM | POA: Diagnosis not present

## 2023-06-04 DIAGNOSIS — Z79899 Other long term (current) drug therapy: Secondary | ICD-10-CM | POA: Insufficient documentation

## 2023-06-04 DIAGNOSIS — R55 Syncope and collapse: Secondary | ICD-10-CM | POA: Insufficient documentation

## 2023-06-04 DIAGNOSIS — Z7982 Long term (current) use of aspirin: Secondary | ICD-10-CM | POA: Insufficient documentation

## 2023-06-04 DIAGNOSIS — Z853 Personal history of malignant neoplasm of breast: Secondary | ICD-10-CM | POA: Diagnosis not present

## 2023-06-04 LAB — BASIC METABOLIC PANEL
Anion gap: 7 (ref 5–15)
BUN: 20 mg/dL (ref 8–23)
CO2: 25 mmol/L (ref 22–32)
Calcium: 10.6 mg/dL — ABNORMAL HIGH (ref 8.9–10.3)
Chloride: 108 mmol/L (ref 98–111)
Creatinine, Ser: 1.4 mg/dL — ABNORMAL HIGH (ref 0.44–1.00)
GFR, Estimated: 39 mL/min — ABNORMAL LOW (ref >60)
Glucose, Bld: 128 mg/dL — ABNORMAL HIGH (ref 70–99)
Potassium: 4 mmol/L (ref 3.5–5.1)
Sodium: 140 mmol/L (ref 135–145)

## 2023-06-04 LAB — CBC
HCT: 43.3 % (ref 36.0–46.0)
Hemoglobin: 13.8 g/dL (ref 12.0–15.0)
MCH: 31.2 pg (ref 26.0–34.0)
MCHC: 31.9 g/dL (ref 30.0–36.0)
MCV: 97.7 fL (ref 80.0–100.0)
Platelets: 175 10*3/uL (ref 150–400)
RBC: 4.43 MIL/uL (ref 3.87–5.11)
RDW: 13 % (ref 11.5–15.5)
WBC: 5 10*3/uL (ref 4.0–10.5)
nRBC: 0 % (ref 0.0–0.2)

## 2023-06-04 LAB — CBG MONITORING, ED: Glucose-Capillary: 130 mg/dL — ABNORMAL HIGH (ref 70–99)

## 2023-06-04 NOTE — ED Provider Notes (Signed)
[place] EMERGENCY DEPARTMENT AT [facility] Provider Note   CSN: [id number] Arrival date & time: 06/04/23  2033     History  Chief Complaint  Patient presents with   Loss of Consciousness    Shannon Nielsen is a 77 y.o. female with history of HTN, HLD, prediabetes, GERD, breast cancer, dementia, who presents to the ER with possible loss of consciousness.  Per family, patient was wandering in her house like she normally does.  Later her husband found her laying on the bed and she was not very responsive.  Family called 911, and when EMS arrived she was alert to verbal stimuli.  Upon ER arrival she is alert and at her mental baseline, where she can follow commands but does not normally maintain conversation.  EMS reported she had some bradycardia on EKG and some soft blood pressures.   Loss of Consciousness      Home Medications Prior to Admission medications   Medication Sig Start Date End Date Taking? Authorizing Provider  aspirin 81 MG tablet Take 81 mg by mouth daily.     [provider]  Cholecalciferol (VITAMIN D3) 1.25 MG (50000 UT) CAPS TAKE ONE CAPSULE THREE DAYS A WEEK FOR TWELVE WEEKS. Patient taking differently: 2,000 Units daily. 06/25/19   Lucky Cowboy, MD  donepezil (ARICEPT) 10 MG tablet TAKE 1 TABLET BY MOUTH ONCE DAILY AT BEDTIME FOR  MEMORY 04/14/23   Raynelle Dick, NP  memantine (NAMENDA) 10 MG tablet TAKE 1 TABLET BY MOUTH TWICE DAILY FOR  MEMORY 05/05/23   Raynelle Dick, NP  olmesartan (BENICAR) 40 MG tablet Take  1 tablet at Bedtime for BP                                                       /                                                                  TAKE                                             BY                                         MOUTH 10/31/22   Lucky Cowboy, MD  OVER THE COUNTER MEDICATION daily. B-Complex with 1000 mcg Biotin    [provider]  rosuvastatin (CRESTOR) 20 MG tablet TAKE 1 TABLET BY  MOUTH ONCE DAILY FOR CHOLESTEROL 02/04/22   Raynelle Dick, NP      Allergies    Minocycline, Prednisone, Sulfa antibiotics, Zithromax [azithromycin], Ciprofloxacin hcl, and Penicillins    Review of Systems   Review of Systems  Cardiovascular:  Positive for syncope.  All other systems reviewed and are negative.   Physical Exam Updated Vital Signs BP 111/68   Pulse 60   Temp  97.9 F (36.6 C) (Oral)   Resp 14   SpO2 100%  Physical Exam Vitals and nursing note reviewed.  Constitutional:      Appearance: Normal appearance.  HENT:     Head: Normocephalic and atraumatic.  Eyes:     Conjunctiva/sclera: Conjunctivae normal.  Cardiovascular:     Rate and Rhythm: Normal rate and regular rhythm.  Pulmonary:     Effort: Pulmonary effort is normal. No respiratory distress.     Breath sounds: Normal breath sounds.  Chest:     Comments: Chest wall stable Abdominal:     General: There is no distension.     Palpations: Abdomen is soft.     Tenderness: There is no abdominal tenderness.  Musculoskeletal:     Comments: Pelvis stable  Skin:    General: Skin is warm and dry.  Neurological:     General: No focal deficit present.     Mental Status: She is alert.     ED Results / Procedures / Treatments   Labs (all labs ordered are listed, but only abnormal results are displayed) Labs Reviewed  BASIC METABOLIC PANEL - Abnormal; Notable for the following components:      Result Value   Glucose, Bld 128 (*)    Creatinine, Ser 1.40 (*)    Calcium 10.6 (*)    GFR, Estimated 39 (*)    All other components within normal limits  CBG MONITORING, ED - Abnormal; Notable for the following components:   Glucose-Capillary 130 (*)    All other components within normal limits  CBC    EKG EKG Interpretation Date/Time:  Wednesday June 04 2023 20:49:58 EST Ventricular Rate:  58 PR Interval:  158 QRS Duration:  78 QT Interval:  420 QTC Calculation: 412 R Axis:   -13  Text  Interpretation: Sinus bradycardia No significant change since last tracing When compared with ECG of 13-Sep-2009 09:11, PREVIOUS ECG IS PRESENT Confirmed by Kommor, Madison ([id number]) on 06/04/2023 11:29:53 PM  Radiology CT Head Wo Contrast Result Date: 06/04/2023 CLINICAL DATA:  Mental status change, unknown cause Syncope/presyncope, cerebrovascular cause suspected EXAM: CT HEAD WITHOUT CONTRAST TECHNIQUE: Contiguous axial images were obtained from the base of the skull through the vertex without intravenous contrast. RADIATION DOSE REDUCTION: This exam was performed according to the departmental dose-optimization program which includes automated exposure control, adjustment of the mA and/or kV according to patient size and/or use of iterative reconstruction technique. COMPARISON:  None Available. FINDINGS: Brain: Advanced diffuse cerebral atrophy and severe chronic microvascular disease throughout the deep white matter. Extensive calcifications along the falx. No acute intracranial abnormality. Specifically, no hemorrhage, hydrocephalus, mass lesion, acute infarction, or significant intracranial injury. Vascular: No hyperdense vessel or unexpected calcification. Skull: No acute calvarial abnormality. Sinuses/Orbits: No acute findings Other: None IMPRESSION: Severe atrophy, chronic small vessel disease. No acute intracranial abnormality. Electronically Signed   By: Charlett Nose M.D.   On: 06/04/2023 23:39    Procedures Procedures    Medications Ordered in ED Medications - No data to display  ED Course/ Medical Decision Making/ A&P                                 Medical Decision Making Amount and/or Complexity of Data Reviewed Labs: ordered.   This patient is a 77 y.o. female  who presents to the ED for concern of possible syncope, transient AMS.   Differential diagnoses prior to evaluation:  The emergent differential diagnosis includes, but is not limited to,  CVA, ACS, arrhythmia, vasovagal  syncope, orthostatic hypotension, sepsis, hypoglycemia, electrolyte disturbance, respiratory failure, symptomatic anemia, dehydration, heat injury, polypharmacy, malignancy, anxiety/panic attack. This is not an exhaustive differential.   Past Medical History / Co-morbidities / Social History: HTN, HLD, prediabetes, GERD, breast cancer, dementia  Physical Exam: Physical exam performed. The pertinent findings include: Normal vital signs, no acute distress.  At mental baseline.  Heart regular rate and rhythm, lung sounds clear.  No evidence of trauma.  Lab Tests/Imaging studies: I personally interpreted labs/imaging and the pertinent results include: CBC normal, BMP at baseline.  CT head without acute abnormalities.. I agree with the radiologist interpretation.  Cardiac monitoring: EKG obtained and interpreted by myself and attending physician which shows: sinus bradycardia   Disposition: After consideration of the diagnostic results and the patients response to treatment, I feel that emergency department workup does not suggest an emergent condition requiring admission or immediate intervention beyond what has been performed at this time. The plan is: discharge to home. No emergent etiology found for possible syncopal episode. No trauma or incontinence, no prolonged postictal period to suggest seizure as cause. EKG without acute ischemic findings, unlikely ACS. Patient at her baseline mental status, and husband feels comfortable taking her home.  The patient is safe for discharge and has been instructed to return immediately for worsening symptoms, change in symptoms or any other concerns.  Final Clinical Impression(s) / ED Diagnoses Final diagnoses:  Syncope, unspecified syncope type    Rx / DC Orders ED Discharge Orders     None      Portions of this report may have been transcribed using voice recognition software. Every effort was made to ensure accuracy; however, inadvertent  computerized transcription errors may be present.    Jeanella Flattery 06/05/23 0020    Glendora Score, MD 06/05/23 [phone]

## 2023-06-04 NOTE — ED Provider Notes (Incomplete)
[place] EMERGENCY DEPARTMENT AT [facility] Provider Note   CSN: [id number] Arrival date & time: 06/04/23  2033     History {Add pertinent medical, surgical, social history, OB history to HPI:1} Chief Complaint  Patient presents with  . Loss of Consciousness    Shannon Nielsen is a 77 y.o. female with history of HTN, HLD, prediabetes, GERD, breast cancer, dementia, who presents to the ER with possible loss of consciousness.  Per family, patient was wandering in her house like she normally does.  Later her husband found her laying on the bed and she was not very responsive.  Family called 911, and when EMS arrived she was alert to verbal stimuli.  Upon ER arrival she is alert and at her mental baseline, where she can follow commands but does not normally maintain conversation.  EMS reported she had some bradycardia on EKG and some soft blood pressures.   Loss of Consciousness      Home Medications Prior to Admission medications   Medication Sig Start Date End Date Taking? Authorizing Provider  aspirin 81 MG tablet Take 81 mg by mouth daily.     [provider]  Cholecalciferol (VITAMIN D3) 1.25 MG (50000 UT) CAPS TAKE ONE CAPSULE THREE DAYS A WEEK FOR TWELVE WEEKS. Patient taking differently: 2,000 Units daily. 06/25/19   Lucky Cowboy, MD  donepezil (ARICEPT) 10 MG tablet TAKE 1 TABLET BY MOUTH ONCE DAILY AT BEDTIME FOR  MEMORY 04/14/23   Raynelle Dick, NP  memantine (NAMENDA) 10 MG tablet TAKE 1 TABLET BY MOUTH TWICE DAILY FOR  MEMORY 05/05/23   Raynelle Dick, NP  olmesartan (BENICAR) 40 MG tablet Take  1 tablet at Bedtime for BP                                                       /                                                                  TAKE                                             BY                                         MOUTH 10/31/22   Lucky Cowboy, MD  OVER THE COUNTER MEDICATION daily. B-Complex with 1000 mcg Biotin     [provider]  rosuvastatin (CRESTOR) 20 MG tablet TAKE 1 TABLET BY MOUTH ONCE DAILY FOR CHOLESTEROL 02/04/22   Raynelle Dick, NP      Allergies    Minocycline, Prednisone, Sulfa antibiotics, Zithromax [azithromycin], Ciprofloxacin hcl, and Penicillins    Review of Systems   Review of Systems  Cardiovascular:  Positive for syncope.    Physical Exam Updated Vital Signs BP 102/67 (BP Location: Right Arm)  Pulse 65   Temp 97.9 F (36.6 C) (Oral)   Resp 16   SpO2 96%  Physical Exam  ED Results / Procedures / Treatments   Labs (all labs ordered are listed, but only abnormal results are displayed) Labs Reviewed  BASIC METABOLIC PANEL - Abnormal; Notable for the following components:      Result Value   Glucose, Bld 128 (*)    Creatinine, Ser 1.40 (*)    Calcium 10.6 (*)    GFR, Estimated 39 (*)    All other components within normal limits  CBG MONITORING, ED - Abnormal; Notable for the following components:   Glucose-Capillary 130 (*)    All other components within normal limits  CBC  URINALYSIS, ROUTINE W REFLEX MICROSCOPIC    EKG None  Radiology No results found.  Procedures Procedures  {Document cardiac monitor, telemetry assessment procedure when appropriate:1}  Medications Ordered in ED Medications - No data to display  ED Course/ Medical Decision Making/ A&P   {   Click here for ABCD2, HEART and other calculatorsREFRESH Note before signing :1}                              Medical Decision Making Amount and/or Complexity of Data Reviewed Labs: ordered.   This patient is a 77 y.o. female  who presents to the ED for concern of possible syncope, transient AMS.   Differential diagnoses prior to evaluation: The emergent differential diagnosis includes, but is not limited to,  CVA, ACS, arrhythmia, vasovagal syncope, orthostatic hypotension, sepsis, hypoglycemia, electrolyte disturbance, respiratory failure, symptomatic anemia, dehydration,  heat injury, polypharmacy, malignancy, anxiety/panic attack. This is not an exhaustive differential.   Past Medical History / Co-morbidities / Social History: HTN, HLD, prediabetes, GERD, breast cancer, dementia  Additional history: Chart reviewed. Pertinent results include: ***  Physical Exam: Physical exam performed. The pertinent findings include: ***  Lab Tests/Imaging studies: I personally interpreted labs/imaging and the pertinent results include:  ***. ***I agree with the radiologist interpretation.  Cardiac monitoring: EKG obtained and interpreted by myself and attending physician which shows: ***   Medications: I ordered medication including ***.  I have reviewed the patients home medicines and have made adjustments as needed.   Disposition: After consideration of the diagnostic results and the patients response to treatment, I feel that *** .   ***emergency department workup does not suggest an emergent condition requiring admission or immediate intervention beyond what has been performed at this time. The plan is: ***. The patient is safe for discharge and has been instructed to return immediately for worsening symptoms, change in symptoms or any other concerns.  Final Clinical Impression(s) / ED Diagnoses Final diagnoses:  None    Rx / DC Orders ED Discharge Orders     None      Portions of this report may have been transcribed using voice recognition software. Every effort was made to ensure accuracy; however, inadvertent computerized transcription errors may be present.

## 2023-06-04 NOTE — ED Triage Notes (Signed)
Pt is coming from home, wandering around house like she normally does with dementia, she was found laying on the bed not really responding to the husband. They called 911, she was alert to verbal, she is now spontaneously alert, but she is at baseline with unable to maintain conversations but can follow commands. Medic reports some bradycardia, mild hypotension. She doesn't mention any pain.   Medic vitals   90/56 60hr 94%ra 148bgl 16rr

## 2023-06-04 NOTE — Discharge Instructions (Addendum)
Shannon Nielsen was seen in the ER after a syncopal episode.  As we discussed, her blood work, EKG, and CT scan all looked reassuring. We did not find a cause for her passing out.   I recommend encouraging good hydration and have her follow up with her primary doctor after the holiday.   Continue to monitor how she's doing and return to the ER for new or worsening symptoms.

## 2023-06-05 ENCOUNTER — Telehealth: Payer: Self-pay | Admitting: Nurse Practitioner

## 2023-06-05 ENCOUNTER — Other Ambulatory Visit: Payer: Self-pay | Admitting: Nurse Practitioner

## 2023-06-05 DIAGNOSIS — E782 Mixed hyperlipidemia: Secondary | ICD-10-CM

## 2023-06-05 MED ORDER — ROSUVASTATIN CALCIUM 20 MG PO TABS: ORAL_TABLET | ORAL | 3 refills | Status: AC

## 2023-06-05 NOTE — Telephone Encounter (Signed)
Refill on Crestor. Please send to [facility] [address], [place] - [address]

## 2023-07-28 ENCOUNTER — Ambulatory Visit: Payer: Medicare HMO | Admitting: Nurse Practitioner

## 2023-08-08 ENCOUNTER — Other Ambulatory Visit: Payer: Self-pay

## 2023-08-08 ENCOUNTER — Emergency Department (HOSPITAL_COMMUNITY): Payer: Medicare HMO

## 2023-08-08 ENCOUNTER — Encounter (HOSPITAL_COMMUNITY): Payer: Self-pay | Admitting: Emergency Medicine

## 2023-08-08 ENCOUNTER — Observation Stay (HOSPITAL_COMMUNITY)
Admission: EM | Admit: 2023-08-08 | Discharge: 2023-08-09 | Disposition: A | Discharge status: Still In Hospital | Payer: Medicare HMO | Attending: Family Medicine | Admitting: Family Medicine

## 2023-08-08 DIAGNOSIS — J101 Influenza due to other identified influenza virus with other respiratory manifestations: Secondary | ICD-10-CM | POA: Diagnosis not present

## 2023-08-08 DIAGNOSIS — R410 Disorientation, unspecified: Secondary | ICD-10-CM | POA: Insufficient documentation

## 2023-08-08 DIAGNOSIS — F0390 Unspecified dementia without behavioral disturbance: Secondary | ICD-10-CM | POA: Insufficient documentation

## 2023-08-08 DIAGNOSIS — I129 Hypertensive chronic kidney disease with stage 1 through stage 4 chronic kidney disease, or unspecified chronic kidney disease: Secondary | ICD-10-CM | POA: Insufficient documentation

## 2023-08-08 DIAGNOSIS — N183 Chronic kidney disease, stage 3 unspecified: Secondary | ICD-10-CM | POA: Diagnosis not present

## 2023-08-08 DIAGNOSIS — E785 Hyperlipidemia, unspecified: Secondary | ICD-10-CM | POA: Diagnosis not present

## 2023-08-08 DIAGNOSIS — R531 Weakness: Secondary | ICD-10-CM | POA: Diagnosis not present

## 2023-08-08 DIAGNOSIS — Z79899 Other long term (current) drug therapy: Secondary | ICD-10-CM | POA: Diagnosis not present

## 2023-08-08 DIAGNOSIS — Z7901 Long term (current) use of anticoagulants: Secondary | ICD-10-CM | POA: Insufficient documentation

## 2023-08-08 DIAGNOSIS — I6782 Cerebral ischemia: Secondary | ICD-10-CM | POA: Diagnosis not present

## 2023-08-08 DIAGNOSIS — R2681 Unsteadiness on feet: Secondary | ICD-10-CM | POA: Diagnosis not present

## 2023-08-08 DIAGNOSIS — R4182 Altered mental status, unspecified: Secondary | ICD-10-CM | POA: Diagnosis not present

## 2023-08-08 DIAGNOSIS — Z1152 Encounter for screening for COVID-19: Secondary | ICD-10-CM | POA: Insufficient documentation

## 2023-08-08 DIAGNOSIS — R001 Bradycardia, unspecified: Secondary | ICD-10-CM | POA: Diagnosis not present

## 2023-08-08 DIAGNOSIS — I959 Hypotension, unspecified: Secondary | ICD-10-CM | POA: Diagnosis not present

## 2023-08-08 LAB — CBC WITH DIFFERENTIAL/PLATELET
Abs Immature Granulocytes: 0.05 10*3/uL (ref 0.00–0.07)
Basophils Absolute: 0 10*3/uL (ref 0.0–0.1)
Basophils Relative: 0 %
Eosinophils Absolute: 0 10*3/uL (ref 0.0–0.5)
Eosinophils Relative: 0 %
HCT: 42.8 % (ref 36.0–46.0)
Hemoglobin: 13.7 g/dL (ref 12.0–15.0)
Immature Granulocytes: 1 %
Lymphocytes Relative: 5 %
Lymphs Abs: 0.4 10*3/uL — ABNORMAL LOW (ref 0.7–4.0)
MCH: 30.6 pg (ref 26.0–34.0)
MCHC: 32 g/dL (ref 30.0–36.0)
MCV: 95.7 fL (ref 80.0–100.0)
Monocytes Absolute: 0.8 10*3/uL (ref 0.1–1.0)
Monocytes Relative: 11 %
Neutro Abs: 6.1 10*3/uL (ref 1.7–7.7)
Neutrophils Relative %: 83 %
Platelets: 149 10*3/uL — ABNORMAL LOW (ref 150–400)
RBC: 4.47 MIL/uL (ref 3.87–5.11)
RDW: 12.6 % (ref 11.5–15.5)
WBC: 7.4 10*3/uL (ref 4.0–10.5)
nRBC: 0 % (ref 0.0–0.2)

## 2023-08-08 LAB — URINALYSIS, ROUTINE W REFLEX MICROSCOPIC
Bilirubin Urine: NEGATIVE
Glucose, UA: NEGATIVE mg/dL
Hgb urine dipstick: NEGATIVE
Ketones, ur: NEGATIVE mg/dL
Leukocytes,Ua: NEGATIVE
Nitrite: NEGATIVE
Protein, ur: 30 mg/dL — AB
Specific Gravity, Urine: 1.017 (ref 1.005–1.030)
pH: 5 (ref 5.0–8.0)

## 2023-08-08 LAB — RESP PANEL BY RT-PCR (RSV, FLU A&B, COVID)  RVPGX2
Influenza A by PCR: POSITIVE — AB
Influenza B by PCR: NEGATIVE
Resp Syncytial Virus by PCR: NEGATIVE
SARS Coronavirus 2 by RT PCR: NEGATIVE

## 2023-08-08 LAB — BASIC METABOLIC PANEL
Anion gap: 10 (ref 5–15)
BUN: 15 mg/dL (ref 8–23)
CO2: 22 mmol/L (ref 22–32)
Calcium: 10.3 mg/dL (ref 8.9–10.3)
Chloride: 105 mmol/L (ref 98–111)
Creatinine, Ser: 1.16 mg/dL — ABNORMAL HIGH (ref 0.44–1.00)
GFR, Estimated: 49 mL/min — ABNORMAL LOW (ref >60)
Glucose, Bld: 100 mg/dL — ABNORMAL HIGH (ref 70–99)
Potassium: 4.3 mmol/L (ref 3.5–5.1)
Sodium: 137 mmol/L (ref 135–145)

## 2023-08-08 LAB — TROPONIN I (HIGH SENSITIVITY): Troponin I (High Sensitivity): 3 ng/L (ref <18)

## 2023-08-08 MED ORDER — ACETAMINOPHEN 325 MG PO TABS
650.0000 mg | ORAL_TABLET | Freq: Four times a day (QID) | ORAL | Status: DC | PRN
  Administered 2023-08-08: 650 mg via ORAL
  Filled 2023-08-08: qty 2

## 2023-08-08 MED ORDER — ORAL CARE MOUTH RINSE: 15.0000 mL | OROMUCOSAL | Status: DC | PRN

## 2023-08-08 MED ORDER — MEMANTINE HCL 10 MG PO TABS
10.0000 mg | ORAL_TABLET | Freq: Two times a day (BID) | ORAL | Status: DC
  Administered 2023-08-08 – 2023-08-09 (×3): 10 mg via ORAL
  Filled 2023-08-08: qty 1
  Filled 2023-08-08: qty 2
  Filled 2023-08-08: qty 1

## 2023-08-08 MED ORDER — IRBESARTAN 150 MG PO TABS
300.0000 mg | ORAL_TABLET | Freq: Every day | ORAL | Status: DC
  Administered 2023-08-08: 300 mg via ORAL
  Filled 2023-08-08: qty 2
  Filled 2023-08-08: qty 1

## 2023-08-08 MED ORDER — SODIUM CHLORIDE 0.9 % IV BOLUS
1000.0000 mL | Freq: Once | INTRAVENOUS | Status: AC
  Administered 2023-08-08: 1000 mL via INTRAVENOUS

## 2023-08-08 MED ORDER — ONDANSETRON HCL 4 MG PO TABS: 4.0000 mg | ORAL_TABLET | Freq: Four times a day (QID) | ORAL | Status: DC | PRN

## 2023-08-08 MED ORDER — ENOXAPARIN SODIUM 40 MG/0.4ML IJ SOSY
40.0000 mg | PREFILLED_SYRINGE | Freq: Every day | INTRAMUSCULAR | Status: DC
  Administered 2023-08-08 – 2023-08-09 (×2): 40 mg via SUBCUTANEOUS
  Filled 2023-08-08 (×2): qty 0.4

## 2023-08-08 MED ORDER — ONDANSETRON HCL 4 MG/2ML IJ SOLN
4.0000 mg | Freq: Four times a day (QID) | INTRAMUSCULAR | Status: DC | PRN
Start: 2023-08-08 — End: 2023-08-09

## 2023-08-08 MED ORDER — DONEPEZIL HCL 10 MG PO TABS
10.0000 mg | ORAL_TABLET | Freq: Every day | ORAL | Status: DC
Start: 2023-08-08 — End: 2023-08-09
  Administered 2023-08-08: 10 mg via ORAL
  Filled 2023-08-08: qty 1

## 2023-08-08 MED ORDER — ROSUVASTATIN CALCIUM 10 MG PO TABS
20.0000 mg | ORAL_TABLET | Freq: Every day | ORAL | Status: DC
  Administered 2023-08-08 – 2023-08-09 (×2): 20 mg via ORAL
  Filled 2023-08-08: qty 1
  Filled 2023-08-08: qty 2

## 2023-08-08 MED ORDER — ACETAMINOPHEN 650 MG RE SUPP: 650.0000 mg | Freq: Four times a day (QID) | RECTAL | Status: DC | PRN

## 2023-08-08 NOTE — ED Triage Notes (Signed)
 Pt to ED from home via [facility] c/o increased weakness this morning noted by husband.  Per EMS husband also states 1 week of incontinence and has been declining in health for last month.  Pt recently taken out of facility and being taken care of by family and home health.  EMS states hx of dementia, pt oriented to her husband which is baseline.    EMS vitals 100/60 BP, 76 HR, 179 CBG

## 2023-08-08 NOTE — H&P (Signed)
 History and Physical  Shannon Nielsen EAV:[id number] DOB: 10-22-1945 DOA: 08/08/2023  PCP: System, Provider Not In   Chief Complaint: weakness, diarrhea   HPI: Shannon Nielsen is a 78 y.o. female with medical history significant for hypertension, hyperlipidemia, dementia who lives at home with her husband and is being admitted to the hospital with weakness and diarrhea related to influenza A infection.  History provided by my conversation with ER provider, as well as the patient's husband over the phone.  States that she was doing well until a couple days ago when she started to get little bit more weak and confused, this morning she had a large watery bowel movement, was much more confused than usual, and is believed to the point that she could not get out of bed.  He denies any fevers, significant cough.  No other concerns.  Review of Systems: Please see HPI for pertinent positives and negatives. A complete 10 system review of systems are otherwise negative.  Past Medical History:  Diagnosis Date   Breast cancer (HCC)    Breast cancer of upper-outer quadrant of left female breast (HCC) 07/08/2011   Chest tightness    GERD (gastroesophageal reflux disease)    Hepatitis C virus screening Negative 08/31/2012 03/08/2018   Hyperlipidemia    Hyperparathyroidism, primary (HCC) 08/12/2012   Hypertension    Iron deficiency 11/30/2016   Palpitations    Prediabetes    Vitamin D deficiency    Past Surgical History:  Procedure Laterality Date   MASTECTOMY  August 2011   Bilateral   NASAL SINUS SURGERY     PARATHYROIDECTOMY     Social History:  reports that she has never smoked. She has never used smokeless tobacco. She reports that she does not drink alcohol and does not use drugs.  Allergies  Allergen Reactions   Minocycline    Prednisone Swelling   Sulfa Antibiotics     Patient does not remember the type of reaction, happened years ago.   Zithromax [Azithromycin] Swelling   Ciprofloxacin  Hcl Rash   Penicillins Rash    Family History  Problem Relation Age of Onset   Diabetes Mother    Cancer Mother        breast   Alzheimer's disease Mother    Heart attack Father 109   Heart attack Brother    Dementia Maternal Grandmother    Dementia Maternal Grandfather      Prior to Admission medications   Medication Sig Start Date End Date Taking? Authorizing Provider  aspirin 81 MG tablet Take 81 mg by mouth daily.    Yes [provider]  Cholecalciferol (VITAMIN D3) 1.25 MG (50000 UT) CAPS TAKE ONE CAPSULE THREE DAYS A WEEK FOR TWELVE WEEKS. Patient taking differently: Take 2,000 Units by mouth daily. 06/25/19  Yes Lucky Cowboy, MD  donepezil (ARICEPT) 10 MG tablet TAKE 1 TABLET BY MOUTH ONCE DAILY AT BEDTIME FOR  MEMORY Patient taking differently: Take 10 mg by mouth at bedtime. TAKE 1 TABLET BY MOUTH ONCE DAILY AT BEDTIME FOR  MEMORY 04/14/23  Yes Raynelle Dick, NP  memantine (NAMENDA) 10 MG tablet TAKE 1 TABLET BY MOUTH TWICE DAILY FOR  MEMORY Patient taking differently: Take 10 mg by mouth 2 (two) times daily. TAKE 1 TABLET BY MOUTH TWICE DAILY FOR  MEMORY 05/05/23  Yes Raynelle Dick, NP  olmesartan (BENICAR) 40 MG tablet Take  1 tablet at Bedtime for BP                                                       /  TAKE                                             BY                                         MOUTH Patient taking differently: Take 40 mg by mouth daily. Take  1 tablet at Bedtime for BP                                                       /                                                                  TAKE                                             BY                                         MOUTH 10/31/22  Yes Lucky Cowboy, MD  OVER THE COUNTER MEDICATION daily. B-Complex with 1000 mcg Biotin   Yes [provider]  rosuvastatin (CRESTOR) 20 MG tablet TAKE 1 TABLET BY MOUTH ONCE  DAILY FOR CHOLESTEROL Patient taking differently: Take 20 mg by mouth daily. TAKE 1 TABLET BY MOUTH ONCE DAILY FOR CHOLESTEROL 06/05/23  Yes Raynelle Dick, NP    Physical Exam: BP 115/65   Pulse 72   Temp 97.6 F (36.4 C)   Resp 15   Ht 5\' 6"  (1.676 m)   Wt 60 kg   SpO2 99%   BMI 21.35 kg/m  General:  Alert, oriented to self only, calm, in no acute distress, resting comfortably on room air Cardiovascular: RRR, no murmurs or rubs, no peripheral edema  Respiratory: clear to auscultation bilaterally, no wheezes, no crackles  Abdomen: soft, nontender, nondistended, normal bowel tones heard  Skin: dry, no rashes  Musculoskeletal: no joint effusions, normal range of motion  Psychiatric: appropriate affect, normal speech  Neurologic: extraocular muscles intact, clear speech, moving all extremities with intact sensorium         Labs on Admission:  Basic Metabolic Panel: Recent Labs  Lab 08/08/23 0900  NA 137  K 4.3  CL 105  CO2 22  GLUCOSE 100*  BUN 15  CREATININE 1.16*  CALCIUM 10.3   Liver Function Tests: No results for input(s): "AST", "ALT", "ALKPHOS", "BILITOT", "PROT", "ALBUMIN" in the last 168 hours. No results for input(s): "LIPASE", "AMYLASE" in the last 168 hours. No results for input(s): "AMMONIA" in the last 168 hours. CBC: Recent Labs  Lab 08/08/23 0900  WBC 7.4  NEUTROABS 6.1  HGB 13.7  HCT 42.8  MCV 95.7  PLT 149*   Cardiac Enzymes: No results for input(s): "CKTOTAL", "CKMB", "CKMBINDEX", "TROPONINI" in the last 168 hours. BNP (last 3 results) No results for input(s): "BNP" in the last 8760 hours.  ProBNP (last 3 results) No results for input(s): "PROBNP" in the last 8760 hours.  CBG: No results for input(s): "GLUCAP" in the last 168 hours.  Radiological Exams on Admission: CT Head Wo Contrast Result Date: 08/08/2023 CLINICAL DATA:  Mental status change, unknown cause. EXAM: CT HEAD WITHOUT CONTRAST TECHNIQUE: Contiguous axial images were  obtained from the base of the skull through the vertex without intravenous contrast. RADIATION DOSE REDUCTION: This exam was performed according to the departmental dose-optimization program which includes automated exposure control, adjustment of the mA and/or kV according to patient size and/or use of iterative reconstruction technique. COMPARISON:  Head CT 06/04/2023.  Brain MRI 10/06/2017 FINDINGS: Brain: Generalized cerebral and cerebellar atrophy. Patchy and ill-defined hypoattenuation within the cerebral white matter, nonspecific but compatible with moderate chronic small vessel ischemic disease. Prominent dural calcifications again noted. There is no acute intracranial hemorrhage. No demarcated cortical infarct. No extra-axial fluid collection. No evidence of an intracranial mass. No midline shift. Vascular: No hyperdense vessel.  Atherosclerotic calcifications. Skull: No calvarial fracture or aggressive osseous lesion. Sinuses/Orbits: No mass or acute finding within the imaged orbits. Postsurgical appearance of the paranasal sinuses. Mucosal thickening within the bilateral maxillary sinuses at the imaged levels (mild right, moderate left). IMPRESSION: 1.  No evidence of an acute intracranial abnormality. 2. Parenchymal atrophy and chronic small vessel ischemic disease. 3. Mucosal thickening within the bilateral maxillary sinuses at the imaged levels (mild right, moderate left). Electronically Signed   By: Jackey Loge D.O.   On: 08/08/2023 09:33   Assessment/Plan  Shannon Nielsen is a 78 y.o. female with medical history significant for hypertension, hyperlipidemia, dementia who lives at home with her husband and is being admitted to the hospital with weakness and diarrhea related to influenza A infection.  Influenza A infection-likely the cause of her weakness, and current delirium in the setting of dementia. -Observation admission -Patient's husband declines Tamiflu at this time -Continue  supportive care -Droplet precautions  Acute delirium in the setting of baseline dementia, likely due to acute infection -Continue home Aricept, Namenda  Acute on chronic weakness-likely due to her influenza -PT/OT  CKD stage III-renal function appears to be at baseline.  Hypertension-continue ARB with therapeutic interchange of Avapro 300 mg p.o. at bedtime  Hyperlipidemia-Crestor daily  DVT prophylaxis: Lovenox     Code Status: Limited: Do not attempt resuscitation (DNR) -DNR-LIMITED -Do Not Intubate/DNI  This was confirmed by the patient's husband Shannon Nielsen, with whom I spoke to over the phone at the time of admission.  Consults called: None  Admission status: Observation   Time spent: 48 minutes  Mayra Jolliffe Sharlette Dense MD [facility] Pager [phone]  If 7PM-7AM, please contact night-coverage [email or web address] Password [facility]  08/08/2023, 11:52 AM

## 2023-08-08 NOTE — ED Notes (Signed)
 Gave pt a bath and changed the bed

## 2023-08-08 NOTE — ED Notes (Signed)
 Husband wants to get updates on pt whenever she gets a room.

## 2023-08-08 NOTE — Plan of Care (Signed)
 Patient admitted to room 1525 from ED via stretcher, husband also at bedside, admission information obtained from husband, patient assessment completed, no skin issues noted, IV flushed and saline locked. Bed put in low locked position with call bell in reach and bed alarm set, side rails up. Plan of care and goals discussed with husband and patient with time given for questions, patient handbook/guide at bedside.  Problem: Education: Goal: Knowledge of General Education information will improve Description: Including pain rating scale, medication(s)/side effects and non-pharmacologic comfort measures Outcome: Progressing   Problem: Health Behavior/Discharge Planning: Goal: Ability to manage health-related needs will improve Outcome: Progressing   Problem: Clinical Measurements: Goal: Ability to maintain clinical measurements within normal limits will improve Outcome: Progressing Goal: Will remain free from infection Outcome: Progressing Goal: Diagnostic test results will improve Outcome: Progressing Goal: Respiratory complications will improve Outcome: Progressing Goal: Cardiovascular complication will be avoided Outcome: Progressing   Problem: Activity: Goal: Risk for activity intolerance will decrease Outcome: Progressing   Problem: Nutrition: Goal: Adequate nutrition will be maintained Outcome: Progressing   Problem: Coping: Goal: Level of anxiety will decrease Outcome: Progressing   Problem: Elimination: Goal: Will not experience complications related to bowel motility Outcome: Progressing Goal: Will not experience complications related to urinary retention Outcome: Progressing   Problem: Pain Managment: Goal: General experience of comfort will improve and/or be controlled Outcome: Progressing   Problem: Safety: Goal: Ability to remain free from injury will improve Outcome: Progressing   Problem: Skin Integrity: Goal: Risk for impaired skin integrity will  decrease Outcome: Progressing

## 2023-08-08 NOTE — ED Provider Notes (Signed)
 [place] EMERGENCY DEPARTMENT AT [facility] Provider Note   CSN: [id number] Arrival date & time: 08/08/23  4098     History  Chief Complaint  Patient presents with  . Weakness    Shannon Nielsen is a 78 y.o. female with a history of breast cancer, high cholesterol, dementia, borderline hypertension, presenting from home in the company of her husband with concern for generalized weakness.  Her husband reports the patient requires 24-hour caretakers.  Today she was too weak to stand up.  He says she was very agitated in the morning, which is atypical for her, and also had a large runny bowel movement.  He says she was also been incontinent for about a week which he feels is a new change for her.  The patient is a level 5 caveat for dementia.  She currently denies to me that she is having a headache, chest pain, or abdominal pain.  Patient was seen in the emergency department again of December, approximately 2 months ago, with possible loss of conscious.  She was noted to have some bradycardia and soft blood pressure at the time for EMS.  HPI     Home Medications Prior to Admission medications   Medication Sig Start Date End Date Taking? Authorizing Provider  aspirin 81 MG tablet Take 81 mg by mouth daily.     [provider]  Cholecalciferol (VITAMIN D3) 1.25 MG (50000 UT) CAPS TAKE ONE CAPSULE THREE DAYS A WEEK FOR TWELVE WEEKS. Patient taking differently: 2,000 Units daily. 06/25/19   Lucky Cowboy, MD  donepezil (ARICEPT) 10 MG tablet TAKE 1 TABLET BY MOUTH ONCE DAILY AT BEDTIME FOR  MEMORY 04/14/23   Raynelle Dick, NP  memantine (NAMENDA) 10 MG tablet TAKE 1 TABLET BY MOUTH TWICE DAILY FOR  MEMORY 05/05/23   Raynelle Dick, NP  olmesartan (BENICAR) 40 MG tablet Take  1 tablet at Bedtime for BP                                                       /                                                                  TAKE                                              BY                                         MOUTH 10/31/22   Lucky Cowboy, MD  OVER THE COUNTER MEDICATION daily. B-Complex with 1000 mcg Biotin    [provider]  rosuvastatin (CRESTOR) 20 MG tablet TAKE 1 TABLET BY MOUTH ONCE DAILY FOR CHOLESTEROL 06/05/23   Raynelle Dick, NP      Allergies    Minocycline, Prednisone, Sulfa antibiotics, Zithromax [azithromycin],  Ciprofloxacin hcl, and Penicillins    Review of Systems   Review of Systems  Physical Exam Updated Vital Signs BP (!) 103/53   Pulse 91   Temp 97.9 F (36.6 C) (Oral)   Resp (!) 22   Ht 5\' 6"  (1.676 m)   Wt 60 kg   SpO2 99%   BMI 21.35 kg/m  Physical Exam Constitutional:      General: She is not in acute distress. HENT:     Head: Normocephalic and atraumatic.  Eyes:     Conjunctiva/sclera: Conjunctivae normal.     Pupils: Pupils are equal, round, and reactive to light.  Cardiovascular:     Rate and Rhythm: Normal rate and regular rhythm.  Pulmonary:     Effort: Pulmonary effort is normal. No respiratory distress.  Abdominal:     General: There is no distension.     Tenderness: There is no abdominal tenderness.  Skin:    General: Skin is warm and dry.  Neurological:     General: No focal deficit present.     Mental Status: She is alert. Mental status is at baseline.     ED Results / Procedures / Treatments   Labs (all labs ordered are listed, but only abnormal results are displayed) Labs Reviewed  RESP PANEL BY RT-PCR (RSV, FLU A&B, COVID)  RVPGX2 - Abnormal; Notable for the following components:      Result Value   Influenza A by PCR POSITIVE (*)    All other components within normal limits  BASIC METABOLIC PANEL - Abnormal; Notable for the following components:   Glucose, Bld 100 (*)    Creatinine, Ser 1.16 (*)    GFR, Estimated 49 (*)    All other components within normal limits  CBC WITH DIFFERENTIAL/PLATELET - Abnormal; Notable for the following components:   Platelets  149 (*)    Lymphs Abs 0.4 (*)    All other components within normal limits  URINALYSIS, ROUTINE W REFLEX MICROSCOPIC - Abnormal; Notable for the following components:   APPearance HAZY (*)    Protein, ur 30 (*)    Bacteria, UA RARE (*)    All other components within normal limits  TROPONIN I (HIGH SENSITIVITY)    EKG EKG Interpretation Date/Time:  Friday August 08 2023 08:31:59 EST Ventricular Rate:  67 PR Interval:  153 QRS Duration:  82 QT Interval:  376 QTC Calculation: 397 R Axis:   22  Text Interpretation: Sinus rhythm Probable anteroseptal infarct, old Confirmed by Alvester Chou [phone]) on 08/08/2023 8:33:51 AM  Radiology CT Head Wo Contrast Result Date: 08/08/2023 CLINICAL DATA:  Mental status change, unknown cause. EXAM: CT HEAD WITHOUT CONTRAST TECHNIQUE: Contiguous axial images were obtained from the base of the skull through the vertex without intravenous contrast. RADIATION DOSE REDUCTION: This exam was performed according to the departmental dose-optimization program which includes automated exposure control, adjustment of the mA and/or kV according to patient size and/or use of iterative reconstruction technique. COMPARISON:  Head CT 06/04/2023.  Brain MRI 10/06/2017 FINDINGS: Brain: Generalized cerebral and cerebellar atrophy. Patchy and ill-defined hypoattenuation within the cerebral white matter, nonspecific but compatible with moderate chronic small vessel ischemic disease. Prominent dural calcifications again noted. There is no acute intracranial hemorrhage. No demarcated cortical infarct. No extra-axial fluid collection. No evidence of an intracranial mass. No midline shift. Vascular: No hyperdense vessel.  Atherosclerotic calcifications. Skull: No calvarial fracture or aggressive osseous lesion. Sinuses/Orbits: No mass or acute finding within the imaged orbits. Postsurgical  appearance of the paranasal sinuses. Mucosal thickening within the bilateral maxillary sinuses  at the imaged levels (mild right, moderate left). IMPRESSION: 1.  No evidence of an acute intracranial abnormality. 2. Parenchymal atrophy and chronic small vessel ischemic disease. 3. Mucosal thickening within the bilateral maxillary sinuses at the imaged levels (mild right, moderate left). Electronically Signed   By: Jackey Loge D.O.   On: 08/08/2023 09:33    Procedures Procedures    Medications Ordered in ED Medications - No data to display  ED Course/ Medical Decision Making/ A&P Clinical Course as of 08/08/23 1232  Fri Aug 08, 2023  5284 Influenza A By PCR(!): POSITIVE [MT]  1200 Admitted to hospitalist [MT]    Clinical Course User Index [MT] Terald Sleeper, MD                                 Medical Decision Making Amount and/or Complexity of Data Reviewed Labs: ordered. Decision-making details documented in ED Course. Radiology: ordered. ECG/medicine tests: ordered.  Risk Decision regarding hospitalization.   This patient presents to the ED with concern for generalized weakness, incontinence, behavioral change. This involves an extensive number of treatment options, and is a complaint that carries with it a high risk of complications and morbidity.  The differential diagnosis includes chronic dementia related changes versus urinary tract infection versus dehydration or electrolyte derangement versus influenza versus anemia versus other  Co-morbidities that complicate the patient evaluation: History of dementia  Additional history obtained from patient's husband at the bedside   I ordered and personally interpreted labs.  The pertinent results include:  Influenza positive.  No other emergent findings.  UA without clear evidence of infection.  I ordered imaging studies including CT scan of the head I independently visualized and interpreted imaging which showed no emergent findings I agree with the radiologist interpretation  The patient was maintained on a  cardiac monitor.  I personally viewed and interpreted the cardiac monitored which showed an underlying rhythm of: NSR  Per my interpretation the patient's ECG shows no acute ischemic findings   I have reviewed the patients home medicines and have made adjustments as needed  Test Considered: No hypoxia, no cough, no thoracic findings to suggest pneumothorax or pneumonia.  Doubt acute PE.  Doubt meningitis.   Dispostion:  After consideration of the diagnostic results and the patients response to treatment, I feel that the patent would benefit from medical admission.  The patient's husband expresses concern that he is not able to care for her given her abrupt change and weakness and physical status, and confusion.  Therefore we will admit the patient to the hospital for observation including respiratory monitoring and oral intake monitoring.  In discussing the risks and benefits of Tamiflu with her husband you have opted not to initiate this medication out of concern for GI side effects as the patient was having diarrhea.  IV fluid bolus ordered. Droplet precautions ordered         Final Clinical Impression(s) / ED Diagnoses Final diagnoses:  Weakness  Influenza A    Rx / DC Orders ED Discharge Orders     None         Terald Sleeper, MD 08/08/23 1005

## 2023-08-09 DIAGNOSIS — J101 Influenza due to other identified influenza virus with other respiratory manifestations: Secondary | ICD-10-CM | POA: Diagnosis not present

## 2023-08-09 DIAGNOSIS — R531 Weakness: Secondary | ICD-10-CM | POA: Diagnosis not present

## 2023-08-09 LAB — CBC
HCT: 43.1 % (ref 36.0–46.0)
Hemoglobin: 13.2 g/dL (ref 12.0–15.0)
MCH: 30.4 pg (ref 26.0–34.0)
MCHC: 30.6 g/dL (ref 30.0–36.0)
MCV: 99.3 fL (ref 80.0–100.0)
Platelets: 151 10*3/uL (ref 150–400)
RBC: 4.34 MIL/uL (ref 3.87–5.11)
RDW: 12.7 % (ref 11.5–15.5)
WBC: 4.1 10*3/uL (ref 4.0–10.5)
nRBC: 0 % (ref 0.0–0.2)

## 2023-08-09 LAB — BASIC METABOLIC PANEL
Anion gap: 7 (ref 5–15)
BUN: 14 mg/dL (ref 8–23)
CO2: 25 mmol/L (ref 22–32)
Calcium: 10 mg/dL (ref 8.9–10.3)
Chloride: 107 mmol/L (ref 98–111)
Creatinine, Ser: 1.03 mg/dL — ABNORMAL HIGH (ref 0.44–1.00)
GFR, Estimated: 57 mL/min — ABNORMAL LOW (ref >60)
Glucose, Bld: 101 mg/dL — ABNORMAL HIGH (ref 70–99)
Potassium: 3.9 mmol/L (ref 3.5–5.1)
Sodium: 139 mmol/L (ref 135–145)

## 2023-08-09 MED ORDER — ENSURE ENLIVE PO LIQD
237.0000 mL | Freq: Two times a day (BID) | ORAL | Status: DC
  Administered 2023-08-09: 237 mL via ORAL

## 2023-08-09 NOTE — Plan of Care (Signed)

## 2023-08-09 NOTE — Evaluation (Signed)
 Physical Therapy Brief Evaluation and Discharge Note Patient Details Name: Shannon Nielsen MRN: [id number] DOB: 1946/02/25 Today's Date: 08/09/2023   History of Present Illness  Patient is a 78 year old female who presented on 2/2 with diarrhea and weakness. Patient was admitted with influenza A. PMH: HTN, hyperlipidemia, dementia.  Clinical Impression  Pt in recliner very pleasant and husband very attentive during session as well. Pt was able to follow commands verbally or tactile cues to stay on task. Ambulated in hallway 1 person assist. Educated husband with home exercise program to continue to engage balance muscles for ankle strategies and side stepping to keep her mobility as responsive as possible. No further PT needs at this time. Thank you for the consult      PT Assessment Patient does not need any further PT services  Assistance Needed at Discharge  Set up Supervision/Assistance    Equipment Recommendations None recommended by PT  Recommendations for Other Services       Precautions/Restrictions Precautions Precautions: Fall Recall of Precautions/Restrictions: Impaired Restrictions Weight Bearing Restrictions Per Provider Order: No        Mobility  Bed Mobility       General bed mobility comments: not tested, but was in recliner  Transfers Overall transfer level: Needs assistance Equipment used: 1 person hand held assist Transfers: Sit to/from Stand Sit to Stand: Contact guard assist                Ambulation/Gait Ambulation/Gait assistance: Contact guard assist Gait Distance (Feet): 100 Feet Assistive device: 1 person hand held assist Gait Pattern/deviations: Step-through pattern, Narrow base of support Gait Speed: Below normal General Gait Details: no LOB, but felt more secure with 1 hand held assist during mobility. Educated husband on how to use the gait belt, assist patient, and cue her . Worked with side stepping, toe and heel raises to  encourage balance strateies through " fun" dance moves that patient would particiapte in with dementia.  We were playing marvin gay music while walking and patient was sisngign some of the words.  Home Activity Instructions    Stairs            Modified Rankin (Stroke Patients Only)        Balance Overall balance assessment: Needs assistance Sitting-balance support: Feet supported Sitting balance-Leahy Scale: Normal     Standing balance support: Single extremity supported, During functional activity Standing balance-Leahy Scale: Good            Pertinent Vitals/Pain PT - Brief Vital Signs All Vital Signs Stable: Yes Pain Assessment Pain Assessment: No/denies pain     Home Living Family/patient expects to be discharged to:: Private residence Living Arrangements: Spouse/significant other Available Help at Discharge: Family;Personal care attendant;Available 24 hours/day (caregiver comes durign day when husband is at work) Home Environment: Stairs to enter;Rail - right  Landscape architect of Steps: 3 Home Equipment: Cane - single point;Shower seat;Grab bars - tub/shower        Prior Function Level of Independence: Needs assistance Comments: needs cues and set up for most basic ADLS, and same for mobility, someone is always with her.    UE/LE Assessment        LE ROM/Strength/Tone/Coordination: WFL (some wekaness present, not formally assess, and tight DF/ PF ankle mobility)      Communication   Communication Communication: Impaired     Cognition Overall Cognitive Status: History of cognitive impairments - at baseline Comments: pt with dementia, husband reports near baseline  General Comments      Exercises Other Exercises Other Exercises: educated pt and husband with standing exercises in [employer]. and texted to his phone for reference. Access Code: [email or web address] URL: [email or web address] Date: 08/09/2023 Prepared by: Nycole Kawahara  [employer]  Exercises - Sideways Walking (Mirrored)  - 3 x daily - 7 x weekly - 1 sets - 10 reps - Heel Toe Raises with Counter Support  - 3 x daily - 7 x weekly - 1 sets - 10 reps - Heel Toe Raises with Unilateral Counter Support  - 3 x daily - 7 x weekly - 1 sets - 10 reps - Standing March  - 3 x daily - 7 x weekly - 1 sets - 10 reps - Alternating Step Forward with Support  - 3 x daily - 7 x weekly - 1 sets - 10 reps   Assessment/Plan    PT Problem List         PT Visit Diagnosis Unsteadiness on feet (R26.81);Muscle weakness (generalized) (M62.81)    No Skilled PT All education completed;Patient will have necessary level of assist by caregiver at discharge   Co-evaluation                AMPAC 6 Clicks Help needed turning from your back to your side while in a flat bed without using bedrails?: A Little Help needed moving from lying on your back to sitting on the side of a flat bed without using bedrails?: A Little Help needed moving to and from a bed to a chair (including a wheelchair)?: A Little Help needed standing up from a chair using your arms (e.g., wheelchair or bedside chair)?: A Little Help needed to walk in hospital room?: A Little Help needed climbing 3-5 steps with a railing? : A Little 6 Click Score: 18      End of Session Equipment Utilized During Treatment: Gait belt Activity Tolerance: Patient tolerated treatment well Patient left: in chair;with call bell/phone within reach;with family/visitor present (could not get the chair alrm to work. told husband to not leave her alone until nurse / unit could get the chair alarm on. I alerted the nurse that the chair alrm was not working as well.) Nurse Communication: Mobility status PT Visit Diagnosis: Unsteadiness on feet (R26.81);Muscle weakness (generalized) (M62.81)     Time: 1005-1040 PT Time Calculation (min) (ACUTE ONLY): 35 min  Charges:   PT Evaluation $PT Eval Low Complexity: 1 Low PT Treatments $Gait  Training: 8-22 mins    Raniyah Curenton, PT, MPT Acute Rehabilitation Services Office: [phone] If a weekend: secure chat groups: [facility] PT, [facility] OT, [facility] SLP 08/09/2023   Benicio Manna  08/09/2023, 12:32 PM

## 2023-08-09 NOTE — Discharge Summary (Signed)
 Physician Discharge Summary   Patient: Shannon Nielsen MRN: [id number] DOB: 1945/10/13  Admit date:     08/08/2023  Discharge date: 08/09/23  Discharge Physician: Meredeth Ide   PCP: System, Provider Not In   Recommendations at discharge:   Follow-up PCP in 1 week as outpatient  Discharge Diagnoses: Principal Problem:   Influenza A  Resolved Problems:   * No resolved hospital problems. *  Hospital Course: 78 y.o. female with medical history significant for hypertension, hyperlipidemia, dementia who lives at home with her husband and is being admitted to the hospital with weakness and diarrhea related to influenza A infection.  History provided by my conversation with ER provider, as well as the patient's husband over the phone.  States that she was doing well until a couple days ago when she started to get little bit more weak and confused, this morning she had a large watery bowel movement, was much more confused than usual, and is believed to the point that she could not get out of bed.  He denies any fevers, significant cough.  No other concerns.    Assessment and Plan:  Influenza A -Presented with generalized weakness, found to have positive influenza A -Not started on Tamiflu, patient has been refused Tamiflu -Not requiring oxygen -Will discharge home  Generalized weakness -Likely in setting of above -PT/OT evaluation obtained, no PT follow-up required  Hypertension -Continue olmesartan  Hyperlipidemia -Continue Crestor  CKD stage III -Creatinine at baseline  Dementia -No behavior disturbance -Continue home regimen        Consultants:  Procedures performed:  Disposition: Home Diet recommendation:  Discharge Diet Orders (From admission, onward)     Start     Ordered   08/09/23 0000  Diet - low sodium heart healthy        08/09/23 1125           Regular diet DISCHARGE MEDICATION: Allergies as of 08/09/2023       Reactions   Minocycline     Prednisone Swelling   Sulfa Antibiotics    Patient does not remember the type of reaction, happened years ago.   Zithromax [azithromycin] Swelling   Ciprofloxacin Hcl Rash   Penicillins Rash        Medication List     TAKE these medications    aspirin 81 MG tablet Take 81 mg by mouth daily.   donepezil 10 MG tablet Commonly known as: ARICEPT TAKE 1 TABLET BY MOUTH ONCE DAILY AT BEDTIME FOR  MEMORY What changed:  how much to take how to take this when to take this   memantine 10 MG tablet Commonly known as: NAMENDA TAKE 1 TABLET BY MOUTH TWICE DAILY FOR  MEMORY What changed: See the new instructions.   olmesartan 40 MG tablet Commonly known as: BENICAR Take  1 tablet at Bedtime for BP                                                       /  TAKE                                             BY                                         MOUTH What changed:  how much to take how to take this when to take this   OVER THE COUNTER MEDICATION daily. B-Complex with 1000 mcg Biotin   rosuvastatin 20 MG tablet Commonly known as: CRESTOR TAKE 1 TABLET BY MOUTH ONCE DAILY FOR CHOLESTEROL What changed:  how much to take how to take this when to take this   Vitamin D3 1.25 MG (50000 UT) Caps TAKE ONE CAPSULE THREE DAYS A WEEK FOR TWELVE WEEKS. What changed:  how much to take how to take this when to take this additional instructions        Discharge Exam: Filed Weights   08/08/23 0656 08/08/23 2005  Weight: 60 kg 56.9 kg   General-appears in no acute distress Heart-S1-S2, regular, no murmur auscultated Lungs-clear to auscultation bilaterally, no wheezing or crackles auscultated Abdomen-soft, nontender, no organomegaly Extremities-no edema in the lower extremities Neuro-alert, oriented x3, no focal deficit noted  Condition at discharge: good  The results of significant diagnostics from this  hospitalization (including imaging, microbiology, ancillary and laboratory) are listed below for reference.   Imaging Studies: CT Head Wo Contrast Result Date: 08/08/2023 CLINICAL DATA:  Mental status change, unknown cause. EXAM: CT HEAD WITHOUT CONTRAST TECHNIQUE: Contiguous axial images were obtained from the base of the skull through the vertex without intravenous contrast. RADIATION DOSE REDUCTION: This exam was performed according to the departmental dose-optimization program which includes automated exposure control, adjustment of the mA and/or kV according to patient size and/or use of iterative reconstruction technique. COMPARISON:  Head CT 06/04/2023.  Brain MRI 10/06/2017 FINDINGS: Brain: Generalized cerebral and cerebellar atrophy. Patchy and ill-defined hypoattenuation within the cerebral white matter, nonspecific but compatible with moderate chronic small vessel ischemic disease. Prominent dural calcifications again noted. There is no acute intracranial hemorrhage. No demarcated cortical infarct. No extra-axial fluid collection. No evidence of an intracranial mass. No midline shift. Vascular: No hyperdense vessel.  Atherosclerotic calcifications. Skull: No calvarial fracture or aggressive osseous lesion. Sinuses/Orbits: No mass or acute finding within the imaged orbits. Postsurgical appearance of the paranasal sinuses. Mucosal thickening within the bilateral maxillary sinuses at the imaged levels (mild right, moderate left). IMPRESSION: 1.  No evidence of an acute intracranial abnormality. 2. Parenchymal atrophy and chronic small vessel ischemic disease. 3. Mucosal thickening within the bilateral maxillary sinuses at the imaged levels (mild right, moderate left). Electronically Signed   By: Jackey Loge D.O.   On: 08/08/2023 09:33    Microbiology: Results for orders placed or performed during the hospital encounter of 08/08/23  Resp panel by RT-PCR (RSV, Flu A&B, Covid) Anterior Nasal Swab      Status: Abnormal   Collection Time: 08/08/23  8:33 AM   Specimen: Anterior Nasal Swab  Result Value Ref Range Status   SARS Coronavirus 2 by RT PCR NEGATIVE NEGATIVE Final    Comment: (NOTE) SARS-CoV-2 target nucleic acids are NOT DETECTED.  The SARS-CoV-2 RNA is generally detectable in upper respiratory specimens during the acute phase of infection.  The lowest concentration of SARS-CoV-2 viral copies this assay can detect is 138 copies/mL. A negative result does not preclude SARS-Cov-2 infection and should not be used as the sole basis for treatment or other patient management decisions. A negative result may occur with  improper specimen collection/handling, submission of specimen other than nasopharyngeal swab, presence of viral mutation(s) within the areas targeted by this assay, and inadequate number of viral copies(<138 copies/mL). A negative result must be combined with clinical observations, patient history, and epidemiological information. The expected result is Negative.  Fact Sheet for Patients:  BloggerCourse.com  Fact Sheet for Healthcare Providers:  SeriousBroker.it  This test is no t yet approved or cleared by the [place] FDA and  has been authorized for detection and/or diagnosis of SARS-CoV-2 by FDA under an Emergency Use Authorization (EUA). This EUA will remain  in effect (meaning this test can be used) for the duration of the COVID-19 declaration under Section 564(b)(1) of the Act, 21 U.S.C.section 360bbb-3(b)(1), unless the authorization is terminated  or revoked sooner.       Influenza A by PCR POSITIVE (A) NEGATIVE Final   Influenza B by PCR NEGATIVE NEGATIVE Final    Comment: (NOTE) The Xpert Xpress SARS-CoV-2/FLU/RSV plus assay is intended as an aid in the diagnosis of influenza from Nasopharyngeal swab specimens and should not be used as a sole basis for treatment. Nasal washings and aspirates  are unacceptable for Xpert Xpress SARS-CoV-2/FLU/RSV testing.  Fact Sheet for Patients: BloggerCourse.com  Fact Sheet for Healthcare Providers: SeriousBroker.it  This test is not yet approved or cleared by the [place] FDA and has been authorized for detection and/or diagnosis of SARS-CoV-2 by FDA under an Emergency Use Authorization (EUA). This EUA will remain in effect (meaning this test can be used) for the duration of the COVID-19 declaration under Section 564(b)(1) of the Act, 21 U.S.C. section 360bbb-3(b)(1), unless the authorization is terminated or revoked.     Resp Syncytial Virus by PCR NEGATIVE NEGATIVE Final    Comment: (NOTE) Fact Sheet for Patients: BloggerCourse.com  Fact Sheet for Healthcare Providers: SeriousBroker.it  This test is not yet approved or cleared by the [place] FDA and has been authorized for detection and/or diagnosis of SARS-CoV-2 by FDA under an Emergency Use Authorization (EUA). This EUA will remain in effect (meaning this test can be used) for the duration of the COVID-19 declaration under Section 564(b)(1) of the Act, 21 U.S.C. section 360bbb-3(b)(1), unless the authorization is terminated or revoked.  Performed at [facility], [address], [place], [place] [address]     Labs: CBC: Recent Labs  Lab 08/08/23 0900 08/09/23 0529  WBC 7.4 4.1  NEUTROABS 6.1  --   HGB 13.7 13.2  HCT 42.8 43.1  MCV 95.7 99.3  PLT 149* 151   Basic Metabolic Panel: Recent Labs  Lab 08/08/23 0900 08/09/23 0529  NA 137 139  K 4.3 3.9  CL 105 107  CO2 22 25  GLUCOSE 100* 101*  BUN 15 14  CREATININE 1.16* 1.03*  CALCIUM 10.3 10.0   Liver Function Tests: No results for input(s): "AST", "ALT", "ALKPHOS", "BILITOT", "PROT", "ALBUMIN" in the last 168 hours. CBG: No results for input(s): "GLUCAP" in the last 168  hours.  Discharge time spent: greater than 30 minutes.  Signed: Meredeth Ide, MD [facility] 08/09/2023

## 2023-08-09 NOTE — Evaluation (Signed)
 Occupational Therapy Evaluation Patient Details Name: Shannon Nielsen MRN: [id number] DOB: 05/23/1946 Today's Date: 08/09/2023   History of Present Illness   Patient is a 78 year old female who presented on 2/2 with diarrhea and weakness. Patient was admitted with influenza A. PMH: HTN, hyperlipidemia, dementia.     Clinical Impressions Patient evaluated by Occupational Therapy with no further acute OT needs identified. All education has been completed and the patient has no further questions. Husband endorsed that wife was near baseline for Adls at this time.  See below for any follow-up Occupational Therapy or equipment needs. OT is signing off. Thank you for this referral.     If plan is discharge home, recommend the following:   A little help with walking and/or transfers;A little help with bathing/dressing/bathroom;Assistance with cooking/housework;Direct supervision/assist for medications management;Assist for transportation;Help with stairs or ramp for entrance;Direct supervision/assist for financial management     Functional Status Assessment   Patient has had a recent decline in their functional status and demonstrates the ability to make significant improvements in function in a reasonable and predictable amount of time.     Equipment Recommendations   None recommended by OT      Precautions/Restrictions   Precautions Precautions: Fall Recall of Precautions/Restrictions: Impaired Restrictions Weight Bearing Restrictions Per Provider Order: No     Mobility Bed Mobility Overal bed mobility: Needs Assistance Bed Mobility: Supine to Sit     Supine to sit: Min assist, HOB elevated     General bed mobility comments: with increased time. patient not motivated to get out of bed.              Balance Overall balance assessment: Mild deficits observed, not formally tested                 ADL either performed or assessed with clinical judgement    ADL Overall ADL's : At baseline           General ADL Comments: patient was min A for bed mobility with increased time and encouragement from husband. patient's husband reported that patient needs supervision and consistent cues for intiation and sequencing of tasks at baseline. patient was able to identify husband in room. unable to report name or birthday. patient was able to transfer with HHA to recliner in room with increased time. patient was able to doff/don socks sitting in recliner with increased time adn cues during session. patient's husband endorsed that patient is near baseline for ADls at this time.     Vision   Vision Assessment?: No apparent visual deficits Additional Comments: patient has dementia with difficult time following cues. husband present to encourage patient.            Pertinent Vitals/Pain Pain Assessment Pain Assessment: No/denies pain     Extremity/Trunk Assessment Upper Extremity Assessment Upper Extremity Assessment: Overall WFL for tasks assessed   Lower Extremity Assessment Lower Extremity Assessment: Defer to PT evaluation   Cervical / Trunk Assessment Cervical / Trunk Assessment: Normal   Communication     Cognition Arousal: Alert Behavior During Therapy: Flat affect Cognition: History of cognitive impairments             OT - Cognition Comments: patient has dementia with husband present in room reporting patient is near baseline at this time. does not report her name or birthdate but able to identify her husband in room.  Following commands: Impaired Following commands impaired: Follows one step commands with increased time                Home Living Family/patient expects to be discharged to:: Private residence Living Arrangements: Spouse/significant other Available Help at Discharge: Family;Available 24 hours/day Type of Home: House Home Access: Stairs to enter [employer] of Steps:  2 in back or 5 out the front.   Home Layout: One level     Bathroom Shower/Tub: Walk-in shower         Home Equipment: Gilmer Mor - single point;Shower seat;Grab bars - tub/shower          Prior Functioning/Environment Prior Level of Function : Needs assist  Cognitive Assist : ADLs (cognitive)   ADLs (Cognitive): Step by step cues              OT Problem List: Impaired balance (sitting and/or standing);Decreased knowledge of precautions;Decreased activity tolerance;Decreased knowledge of use of DME or AE   OT Treatment/Interventions: Self-care/ADL training;Therapeutic activities;Patient/family education      OT Goals(Current goals can be found in the care plan section)   Acute Rehab OT Goals Patient Stated Goal: to get home OT Goal Formulation: With family Time For Goal Achievement: 08/23/23 Potential to Achieve Goals: Fair   OT Frequency:  Min 1X/week       AM-PAC OT "6 Clicks" Daily Activity     Outcome Measure Help from another person eating meals?: A Little Help from another person taking care of personal grooming?: A Little Help from another person toileting, which includes using toliet, bedpan, or urinal?: A Little Help from another person bathing (including washing, rinsing, drying)?: A Little Help from another person to put on and taking off regular upper body clothing?: A Little Help from another person to put on and taking off regular lower body clothing?: A Little 6 Click Score: 18   End of Session Equipment Utilized During Treatment: Gait belt  Activity Tolerance: Patient tolerated treatment well Patient left: in chair;with call bell/phone within reach;with chair alarm set;with family/visitor present  OT Visit Diagnosis: Unsteadiness on feet (R26.81)                Time: 9604-5409 OT Time Calculation (min): 19 min Charges:  OT General Charges $OT Visit: 1 Visit OT Evaluation $OT Eval Low Complexity: 1 Low  Kerrick Miler OTR/L, MS Acute  Rehabilitation Department Office# [phone]   Selinda Flavin 08/09/2023, 10:31 AM

## 2023-08-09 NOTE — Progress Notes (Incomplete)
 [facility]  PROGRESS NOTE  Shannon Nielsen ZOX:[id number] DOB: 1945/08/30 DOA: 08/08/2023 PCP: System, Provider Not In   Brief HPI:   ***    Assessment/Plan:   ***    Medications     donepezil  10 mg Oral QHS   enoxaparin (LOVENOX) injection  40 mg Subcutaneous Daily   feeding supplement  237 mL Oral BID BM   irbesartan  300 mg Oral QHS   memantine  10 mg Oral BID   rosuvastatin  20 mg Oral Daily     Data Reviewed:   CBG:  No results for input(s): "GLUCAP" in the last 168 hours.  SpO2: 100 %    Vitals:   08/08/23 2005 08/09/23 0004 08/09/23 0449 08/09/23 0807  BP: 139/73 133/73 (!) 152/79 123/75  Pulse: 89 76 71 90  Resp: 19 19 18 16   Temp: 98.4 F (36.9 C) 98.7 F (37.1 C) 98.2 F (36.8 C) 97.9 F (36.6 C)  TempSrc: Oral Oral Oral Oral  SpO2: 97% 97% 97% 100%  Weight: 56.9 kg     Height: 5' 5.98" (1.676 m)         Data Reviewed:  Basic Metabolic Panel: Recent Labs  Lab 08/08/23 0900 08/09/23 0529  NA 137 139  K 4.3 3.9  CL 105 107  CO2 22 25  GLUCOSE 100* 101*  BUN 15 14  CREATININE 1.16* 1.03*  CALCIUM 10.3 10.0    CBC: Recent Labs  Lab 08/08/23 0900 08/09/23 0529  WBC 7.4 4.1  NEUTROABS 6.1  --   HGB 13.7 13.2  HCT 42.8 43.1  MCV 95.7 99.3  PLT 149* 151    LFT No results for input(s): "AST", "ALT", "ALKPHOS", "BILITOT", "PROT", "ALBUMIN" in the last 168 hours.   Antibiotics: Anti-infectives (From admission, onward)    None        DVT prophylaxis: ***  Code Status: ***  Family Communication: ***   CONSULTS ***   Subjective   ***   Objective    Physical Examination:   General:  *** Cardiovascular: *** Respiratory: *** Abdomen: *** Extremities: ***  Neurologic:  ***   Status is: Inpatient:  ***           Shannon Nielsen S Shannon Nielsen   [facility] If 7PM-7AM, please contact night-coverage at [email or web address], Office  [phone]   08/09/2023, 8:45 AM  LOS: 0 days

## 2023-09-23 ENCOUNTER — Ambulatory Visit: Payer: Medicare HMO | Admitting: Nurse Practitioner

## 2023-10-30 ENCOUNTER — Ambulatory Visit: Payer: Medicare HMO | Admitting: Internal Medicine

## 2024-02-05 ENCOUNTER — Ambulatory Visit: Payer: Medicare HMO | Admitting: Nurse Practitioner

## 2024-05-05 ENCOUNTER — Encounter: Payer: Medicare HMO | Admitting: Internal Medicine

## 2024-05-12 ENCOUNTER — Encounter: Payer: Medicare HMO | Admitting: Internal Medicine

## 2024-07-11 DEATH — deceased

## 2024-07-30 ENCOUNTER — Ambulatory Visit: Admitting: Adult Health
